# Patient Record
Sex: Female | Born: 1965 | Hispanic: No | Marital: Married | Smoking: Former smoker
Health system: Southern US, Community
[De-identification: ages and names within clinical notes are randomized; demographics above are authoritative.]

## PROBLEM LIST (undated history)

## (undated) DIAGNOSIS — K219 Gastro-esophageal reflux disease without esophagitis: Secondary | ICD-10-CM

## (undated) DIAGNOSIS — K599 Functional intestinal disorder, unspecified: Secondary | ICD-10-CM

## (undated) DIAGNOSIS — F329 Major depressive disorder, single episode, unspecified: Secondary | ICD-10-CM

## (undated) DIAGNOSIS — K3184 Gastroparesis: Secondary | ICD-10-CM

## (undated) DIAGNOSIS — N3010 Interstitial cystitis (chronic) without hematuria: Secondary | ICD-10-CM

## (undated) DIAGNOSIS — I7300 Raynaud's syndrome without gangrene: Secondary | ICD-10-CM

## (undated) DIAGNOSIS — F32A Depression, unspecified: Secondary | ICD-10-CM

## (undated) DIAGNOSIS — R000 Tachycardia, unspecified: Secondary | ICD-10-CM

## (undated) DIAGNOSIS — I951 Orthostatic hypotension: Secondary | ICD-10-CM

## (undated) DIAGNOSIS — F419 Anxiety disorder, unspecified: Secondary | ICD-10-CM

## (undated) DIAGNOSIS — K6289 Other specified diseases of anus and rectum: Secondary | ICD-10-CM

## (undated) DIAGNOSIS — M5030 Other cervical disc degeneration, unspecified cervical region: Secondary | ICD-10-CM

## (undated) DIAGNOSIS — G90A Postural orthostatic tachycardia syndrome (POTS): Secondary | ICD-10-CM

## (undated) DIAGNOSIS — R42 Dizziness and giddiness: Secondary | ICD-10-CM

## (undated) DIAGNOSIS — I498 Other specified cardiac arrhythmias: Secondary | ICD-10-CM

## (undated) DIAGNOSIS — E669 Obesity, unspecified: Secondary | ICD-10-CM

## (undated) DIAGNOSIS — E162 Hypoglycemia, unspecified: Secondary | ICD-10-CM

## (undated) DIAGNOSIS — M797 Fibromyalgia: Secondary | ICD-10-CM

## (undated) DIAGNOSIS — M6289 Other specified disorders of muscle: Secondary | ICD-10-CM

## (undated) HISTORY — PX: TOTAL KNEE ARTHROPLASTY: SHX125

## (undated) HISTORY — DX: Raynaud's syndrome without gangrene: I73.00

## (undated) HISTORY — DX: Hypoglycemia, unspecified: E16.2

## (undated) HISTORY — DX: Other specified disorders of muscle: M62.89

## (undated) HISTORY — DX: Anxiety disorder, unspecified: F41.9

## (undated) HISTORY — PX: CHOLECYSTECTOMY: SHX55

## (undated) HISTORY — DX: Gastro-esophageal reflux disease without esophagitis: K21.9

## (undated) HISTORY — DX: Interstitial cystitis (chronic) without hematuria: N30.10

## (undated) HISTORY — DX: Functional intestinal disorder, unspecified: K59.9

## (undated) HISTORY — DX: Gastroparesis: K31.84

## (undated) HISTORY — DX: Other cervical disc degeneration, unspecified cervical region: M50.30

## (undated) HISTORY — DX: Depression, unspecified: F32.A

## (undated) HISTORY — PX: ILEOSTOMY REVISION: SHX1785

## (undated) HISTORY — DX: Other specified diseases of anus and rectum: K62.89

## (undated) HISTORY — DX: Obesity, unspecified: E66.9

## (undated) HISTORY — DX: Fibromyalgia: M79.7

## (undated) HISTORY — DX: Major depressive disorder, single episode, unspecified: F32.9

---

## 1987-06-28 HISTORY — PX: OTHER SURGICAL HISTORY: SHX169

## 1993-12-20 HISTORY — PX: ESOPHAGOGASTRODUODENOSCOPY: SHX1529

## 1998-01-13 ENCOUNTER — Encounter: Admission: RE | Admit: 1998-01-13 | Discharge: 1998-01-13 | Payer: Self-pay | Admitting: *Deleted

## 1999-05-23 HISTORY — PX: ABDOMINAL HYSTERECTOMY: SHX81

## 1999-10-17 ENCOUNTER — Encounter: Payer: Self-pay | Admitting: Family Medicine

## 1999-10-17 ENCOUNTER — Encounter: Admission: RE | Admit: 1999-10-17 | Discharge: 1999-10-17 | Payer: Self-pay | Admitting: Family Medicine

## 2000-03-28 ENCOUNTER — Encounter: Payer: Self-pay | Admitting: Family Medicine

## 2000-03-28 ENCOUNTER — Encounter: Admission: RE | Admit: 2000-03-28 | Discharge: 2000-03-28 | Payer: Self-pay | Admitting: Family Medicine

## 2001-07-04 ENCOUNTER — Encounter: Payer: Self-pay | Admitting: Internal Medicine

## 2001-07-04 ENCOUNTER — Ambulatory Visit (HOSPITAL_COMMUNITY): Admission: RE | Admit: 2001-07-04 | Discharge: 2001-07-04 | Payer: Self-pay | Admitting: Internal Medicine

## 2001-07-04 ENCOUNTER — Encounter (INDEPENDENT_AMBULATORY_CARE_PROVIDER_SITE_OTHER): Payer: Self-pay | Admitting: Specialist

## 2001-07-04 HISTORY — PX: UPPER GASTROINTESTINAL ENDOSCOPY: SHX188

## 2001-08-08 ENCOUNTER — Encounter: Payer: Self-pay | Admitting: Internal Medicine

## 2001-08-08 HISTORY — PX: COLONOSCOPY: SHX174

## 2002-10-22 ENCOUNTER — Encounter: Payer: Self-pay | Admitting: Emergency Medicine

## 2002-10-22 ENCOUNTER — Emergency Department (HOSPITAL_COMMUNITY): Admission: EM | Admit: 2002-10-22 | Discharge: 2002-10-22 | Payer: Self-pay | Admitting: Emergency Medicine

## 2002-10-28 ENCOUNTER — Encounter (INDEPENDENT_AMBULATORY_CARE_PROVIDER_SITE_OTHER): Payer: Self-pay | Admitting: Specialist

## 2002-10-28 ENCOUNTER — Observation Stay (HOSPITAL_COMMUNITY): Admission: RE | Admit: 2002-10-28 | Discharge: 2002-10-29 | Payer: Self-pay | Admitting: General Surgery

## 2002-10-28 ENCOUNTER — Encounter: Payer: Self-pay | Admitting: General Surgery

## 2003-01-25 ENCOUNTER — Encounter: Admission: RE | Admit: 2003-01-25 | Discharge: 2003-01-25 | Payer: Self-pay | Admitting: Orthopedic Surgery

## 2003-01-25 ENCOUNTER — Encounter: Payer: Self-pay | Admitting: Orthopedic Surgery

## 2004-01-04 ENCOUNTER — Emergency Department (HOSPITAL_COMMUNITY): Admission: EM | Admit: 2004-01-04 | Discharge: 2004-01-04 | Payer: Self-pay | Admitting: Emergency Medicine

## 2004-03-14 ENCOUNTER — Encounter: Admission: RE | Admit: 2004-03-14 | Discharge: 2004-03-14 | Payer: Self-pay | Admitting: Obstetrics & Gynecology

## 2004-03-18 ENCOUNTER — Encounter (HOSPITAL_COMMUNITY): Admission: RE | Admit: 2004-03-18 | Discharge: 2004-06-16 | Payer: Self-pay | Admitting: Diagnostic Radiology

## 2004-12-12 ENCOUNTER — Encounter: Payer: Self-pay | Admitting: Internal Medicine

## 2004-12-14 ENCOUNTER — Encounter: Admission: RE | Admit: 2004-12-14 | Discharge: 2004-12-14 | Payer: Self-pay | Admitting: *Deleted

## 2004-12-14 ENCOUNTER — Encounter: Payer: Self-pay | Admitting: Internal Medicine

## 2005-03-21 ENCOUNTER — Encounter: Admission: RE | Admit: 2005-03-21 | Discharge: 2005-03-21 | Payer: Self-pay | Admitting: Family Medicine

## 2005-07-11 ENCOUNTER — Encounter: Payer: Self-pay | Admitting: Internal Medicine

## 2005-07-17 ENCOUNTER — Encounter: Payer: Self-pay | Admitting: Internal Medicine

## 2005-08-06 ENCOUNTER — Encounter: Admission: RE | Admit: 2005-08-06 | Discharge: 2005-08-06 | Payer: Self-pay | Admitting: *Deleted

## 2005-08-14 ENCOUNTER — Inpatient Hospital Stay (HOSPITAL_COMMUNITY): Admission: EM | Admit: 2005-08-14 | Discharge: 2005-08-19 | Payer: Self-pay | Admitting: Psychiatry

## 2005-08-14 ENCOUNTER — Ambulatory Visit: Payer: Self-pay | Admitting: Psychiatry

## 2005-08-21 ENCOUNTER — Other Ambulatory Visit (HOSPITAL_COMMUNITY): Admission: RE | Admit: 2005-08-21 | Discharge: 2005-08-30 | Payer: Self-pay | Admitting: Psychiatry

## 2005-09-15 ENCOUNTER — Observation Stay (HOSPITAL_COMMUNITY): Admission: EM | Admit: 2005-09-15 | Discharge: 2005-09-16 | Payer: Self-pay | Admitting: Emergency Medicine

## 2005-09-28 ENCOUNTER — Ambulatory Visit (HOSPITAL_COMMUNITY): Admission: RE | Admit: 2005-09-28 | Discharge: 2005-09-28 | Payer: Self-pay | Admitting: Cardiology

## 2006-01-30 ENCOUNTER — Encounter: Payer: Self-pay | Admitting: Internal Medicine

## 2006-03-28 ENCOUNTER — Encounter: Payer: Self-pay | Admitting: Internal Medicine

## 2006-03-30 ENCOUNTER — Encounter: Admission: RE | Admit: 2006-03-30 | Discharge: 2006-03-30 | Payer: Self-pay | Admitting: Obstetrics & Gynecology

## 2006-06-08 ENCOUNTER — Ambulatory Visit (HOSPITAL_COMMUNITY): Admission: RE | Admit: 2006-06-08 | Discharge: 2006-06-08 | Payer: Self-pay | Admitting: Gastroenterology

## 2006-06-08 ENCOUNTER — Encounter: Payer: Self-pay | Admitting: Internal Medicine

## 2006-06-15 ENCOUNTER — Encounter: Payer: Self-pay | Admitting: Internal Medicine

## 2006-06-15 HISTORY — PX: UPPER GASTROINTESTINAL ENDOSCOPY: SHX188

## 2006-06-25 ENCOUNTER — Encounter: Payer: Self-pay | Admitting: Internal Medicine

## 2006-06-28 ENCOUNTER — Ambulatory Visit: Payer: Self-pay | Admitting: Internal Medicine

## 2006-06-29 ENCOUNTER — Ambulatory Visit: Payer: Self-pay | Admitting: Internal Medicine

## 2006-06-29 LAB — CONVERTED CEMR LAB
Basophils Absolute: 0 10*3/uL (ref 0.0–0.1)
Basophils Relative: 0.4 % (ref 0.0–1.0)
Eosinophils Absolute: 0.1 10*3/uL (ref 0.0–0.6)
Eosinophils Relative: 1.8 % (ref 0.0–5.0)
HCT: 41.1 % (ref 36.0–46.0)
Hemoglobin: 14.3 g/dL (ref 12.0–15.0)
Lymphocytes Relative: 29.9 % (ref 12.0–46.0)
MCHC: 34.8 g/dL (ref 30.0–36.0)
MCV: 84.1 fL (ref 78.0–100.0)
Magnesium: 1.9 mg/dL (ref 1.5–2.5)
Monocytes Absolute: 0.4 10*3/uL (ref 0.2–0.7)
Monocytes Relative: 8.5 % (ref 3.0–11.0)
Neutro Abs: 3.1 10*3/uL (ref 1.4–7.7)
Neutrophils Relative %: 59.4 % (ref 43.0–77.0)
Platelets: 306 10*3/uL (ref 150–400)
RBC: 4.88 M/uL (ref 3.87–5.11)
RDW: 12.9 % (ref 11.5–14.6)
TSH: 0.87 microintl units/mL (ref 0.35–5.50)
WBC: 5.1 10*3/uL (ref 4.5–10.5)

## 2006-08-03 ENCOUNTER — Encounter: Admission: RE | Admit: 2006-08-03 | Discharge: 2006-08-03 | Payer: Self-pay | Admitting: Ophthalmology

## 2006-10-12 ENCOUNTER — Inpatient Hospital Stay (HOSPITAL_COMMUNITY): Admission: AD | Admit: 2006-10-12 | Discharge: 2006-10-17 | Payer: Self-pay | Admitting: Psychiatry

## 2006-10-12 ENCOUNTER — Emergency Department (HOSPITAL_COMMUNITY): Admission: EM | Admit: 2006-10-12 | Discharge: 2006-10-12 | Payer: Self-pay | Admitting: Emergency Medicine

## 2006-10-12 ENCOUNTER — Ambulatory Visit: Payer: Self-pay | Admitting: Psychiatry

## 2006-10-30 ENCOUNTER — Encounter: Payer: Self-pay | Admitting: Internal Medicine

## 2007-03-29 ENCOUNTER — Encounter: Admission: RE | Admit: 2007-03-29 | Discharge: 2007-03-29 | Payer: Self-pay | Admitting: Obstetrics & Gynecology

## 2007-08-08 ENCOUNTER — Encounter: Payer: Self-pay | Admitting: Internal Medicine

## 2007-08-13 ENCOUNTER — Encounter: Payer: Self-pay | Admitting: Internal Medicine

## 2007-09-15 ENCOUNTER — Encounter: Admission: RE | Admit: 2007-09-15 | Discharge: 2007-09-15 | Payer: Self-pay | Admitting: Rheumatology

## 2008-03-31 ENCOUNTER — Encounter: Admission: RE | Admit: 2008-03-31 | Discharge: 2008-03-31 | Payer: Self-pay | Admitting: Obstetrics & Gynecology

## 2009-04-05 ENCOUNTER — Encounter: Admission: RE | Admit: 2009-04-05 | Discharge: 2009-04-05 | Payer: Self-pay | Admitting: Obstetrics & Gynecology

## 2009-05-11 ENCOUNTER — Encounter: Payer: Self-pay | Admitting: Internal Medicine

## 2009-05-24 ENCOUNTER — Encounter: Payer: Self-pay | Admitting: Internal Medicine

## 2009-06-22 ENCOUNTER — Encounter: Payer: Self-pay | Admitting: Internal Medicine

## 2009-09-14 ENCOUNTER — Encounter: Payer: Self-pay | Admitting: Internal Medicine

## 2009-10-04 ENCOUNTER — Encounter: Payer: Self-pay | Admitting: Internal Medicine

## 2009-10-16 ENCOUNTER — Emergency Department (HOSPITAL_COMMUNITY): Admission: EM | Admit: 2009-10-16 | Discharge: 2009-10-16 | Payer: Self-pay | Admitting: Emergency Medicine

## 2009-10-16 ENCOUNTER — Encounter: Payer: Self-pay | Admitting: Internal Medicine

## 2009-10-28 ENCOUNTER — Ambulatory Visit: Payer: Self-pay | Admitting: Internal Medicine

## 2009-10-28 DIAGNOSIS — K219 Gastro-esophageal reflux disease without esophagitis: Secondary | ICD-10-CM | POA: Insufficient documentation

## 2009-10-28 DIAGNOSIS — K599 Functional intestinal disorder, unspecified: Secondary | ICD-10-CM

## 2009-11-08 ENCOUNTER — Telehealth: Payer: Self-pay | Admitting: Internal Medicine

## 2009-11-24 ENCOUNTER — Telehealth: Payer: Self-pay | Admitting: Internal Medicine

## 2009-12-06 ENCOUNTER — Telehealth: Payer: Self-pay | Admitting: Internal Medicine

## 2009-12-31 ENCOUNTER — Telehealth: Payer: Self-pay | Admitting: Internal Medicine

## 2010-02-23 ENCOUNTER — Telehealth: Payer: Self-pay | Admitting: Internal Medicine

## 2010-02-25 ENCOUNTER — Encounter: Payer: Self-pay | Admitting: Internal Medicine

## 2010-02-25 ENCOUNTER — Telehealth: Payer: Self-pay | Admitting: Internal Medicine

## 2010-02-27 ENCOUNTER — Encounter: Payer: Self-pay | Admitting: Internal Medicine

## 2010-03-02 ENCOUNTER — Telehealth: Payer: Self-pay | Admitting: Internal Medicine

## 2010-03-04 ENCOUNTER — Telehealth: Payer: Self-pay | Admitting: Internal Medicine

## 2010-03-16 ENCOUNTER — Telehealth: Payer: Self-pay | Admitting: Internal Medicine

## 2010-04-19 ENCOUNTER — Encounter: Admission: RE | Admit: 2010-04-19 | Discharge: 2010-04-19 | Payer: Self-pay | Admitting: Obstetrics & Gynecology

## 2010-05-11 ENCOUNTER — Encounter: Payer: Self-pay | Admitting: Internal Medicine

## 2010-06-21 NOTE — Letter (Signed)
Summary: [facility]  [facility]   Imported By: Lester  11/09/2009 10:56:21  _____________________________________________________________________  External Attachment:    Type:   Image     Comment:   External Document

## 2010-06-21 NOTE — Assessment & Plan Note (Signed)
Summary: abdominal pain/sheri   History of Present Illness Visit Type: new patient  Primary GI MD: Stan Head MD [facility] Primary Provider: Gilmore Laroche, MD  Requesting Provider: na Chief Complaint: Upper abd pain  History of Present Illness:   45 yo ww with epigastric and chest pains diagnosed as GERD and treated as such but having persistent symptoms.In November 2010, costochondritis was diagnosed.She experienced increasing pain in upper abdomen despite therapy. She underwent an EGD and colonoscopy by  Dorena Cookey, MD. She has been on multiple PPI's, Dexilant and Carafate have not helped. She has experienced sore throat and hoarseness lately also. She was evaluated by Dr. Suzanna Obey - Zantac added as  "throat was  inflamed from reflux". Dr. Jearld Fenton indicated she may need a pH probe and could need a fundoplication. She has read about fundoplication and since she also has delayed gastric emptying and a diagnosis of gastroparesis, is concerned about problems fronm that type of surgery. Further symptom descrioptions:  Awakens at night with epigastric pain, feels like contractions fro labor but is constant. Nothing helps. It will ease off and be less severe. Stools green when it happens. Pain everyday, some better than others  No triggers identified. + nausesa, no vomiting, ? early satiety She has eliminated coffee, carbonated drinks, bed elevated, no greasy or spicy foods. Stopped dairy.  Special needs daughter hydrocephalus, developmental delay, chronic diarrhea - reflux, at [facility] Oldest daughter also has GERD, fibromyalgia, IBS   She has seen gastroenterologists since the 1980's at least, having also seen Drs. Buccini and Brodie, with prior endoscopic evaluations for similar problems.   GI Review of Systems    Reports abdominal pain, acid reflux, belching, bloating, chest pain, heartburn, loss of appetite, and  nausea.     Location of  Abdominal pain: upper abdomen.    Denies dysphagia with  liquids, dysphagia with solids, vomiting, vomiting blood, weight loss, and  weight gain.      Reports irritable bowel syndrome.     Denies anal fissure, black tarry stools, change in bowel habit, constipation, diarrhea, fecal incontinence, heme positive stool, hemorrhoids, jaundice, light color stool, liver problems, rectal bleeding, and  rectal pain.     Current Medications (verified): 1)  Dexilant 60 Mg Cpdr (Dexlansoprazole) .... Take 1 Tablet By Mouth Once A Day 2)  Carafate 1 Gm Tabs (Sucralfate) .... Take 1 Tablet By Mouth Four Times A Day 3)  Zantac 300 Mg Tabs (Ranitidine Hcl) .... Take 1 Tab By Mouth At Bedtime 4)  Zofran 4 Mg Tabs (Ondansetron Hcl) .... As Needed  Allergies (verified): 1)  Celebrex 2)  Lunesta (Eszopiclone)  Past History:  Past Medical History: Bipolar Cervical DDD Anxiety Disorder Depression Fibromyalgia GERD Irritable Bowel Syndrome Raynaud's phenomenon Gastroparesis ? Dysautonomia - [facility] says no  Past Surgical History: Reviewed history from 10/28/2009 and no changes required. Cholecystectomy Hysterectomy Knee Arthroscopy R-1989, L-12/2008  Family History: Family History of Colon Polyps: Paternal Uncle No FH of Colon Cancer: Family History of Esophageal Cancer: Paternal Uncle  Family History of Diabetes: Father  Family History of Heart Disease: Father   Social History: Married, 2 girls Homemaker Alcohol Use - no Daily Caffeine Use -2+ day Illicit Drug Use - no Patient gets regular exercise. Patient is a former smoker.  Smoking Status:  quit  Review of Systems       The patient complains of cough, muscle pains/cramps, sore throat, and voice change.         All other  ROS negative except as per HPI.   Vital Signs:  Patient profile:   44 year old female Height:      67 inches Weight:      238 pounds BMI:     37.41 BSA:     2.18 Pulse rate:   88 / minute Pulse rhythm:   regular BP sitting:   126 / 82  (left arm) Cuff size:    regular  Vitals Entered By: Ok Anis CMA (October 28, 2009 1:39 PM)  Physical Exam  General:  obese.  NAD Eyes:   no icterus. Mouth:  No deformity or lesions, dentition normal. Neck:  Supple; no masses or thyromegaly. Chest Wall:  nontender Lungs:  Clear throughout to auscultation. Heart:  Regular rate and rhythm; no murmurs, rubs,  or bruits. Abdomen:  soft, diffusely tender BS+ no masses, HSM Neurologic:  Alert and  oriented x4;  Skin:  sebaceous cyst mid back Cervical Nodes:  No significant cervical or supraclavicular adenopathy.  Psych:  Alert and cooperative. Normal mood and affect.   Impression & Recommendations:  Problem # 1:  ABDOMINAL PAIN, UPPER (ICD-789.09) Assessment New FUNCTIONAL ABDOMINAL PAIN IS MOST LIKELY GIVEN CHRONICITY, LACK OF CORELLATIVE FINDINGS ON ENDOSCOPY AS WELL AS BACKGROUND OF IBS AND FIBROMYALGIA. SHE HAS DELAYED GASTRIC EMPTYING ALSO. SJHE NEEDS A CHANGE IN THERAPEUTIC APPROACH AND WILL TRY NORTRIPTYLLINE AND DICYCLOMINE. THE RATIONALE AND POSSIBLE SIDE EFFECTS HAVE BEEN DISCUSSED.  SHE HAS HAD SIMILAR PROBLEMS GOING BACK TO THE 1980'S.   Will review Dr. Jearld Fenton' and Deveshwar's records.  Problem # 2:  GASTROPARESIS (ICD-536.3) Assessment: New She may do better on low-dose anticholinergics. She has not responded to metaclopramide (per old records) and SHE SHOULD NOT HAVE A FUNDOPLICATION.  Problem # 3:  GERD (ICD-530.81) Assessment: New She most likely has some of this but clearly not causing most/all of herproblems as she has not significantly improved with aggressive therapy. ? if ENT findings in larynzx and pharynx are GERD or something else. she could need a pH probe but would not do yet. Review ENT records.  Problem # 4:  IRRITABLE BOWEL SYNDROME (ICD-564.1) Assessment: Comment Only  Problem # 5:  FIBROMYALGIA (ICD-729.1) Assessment: Comment Only  Complicated patient withextensive past history and records review.  Patient  Instructions: 1)  Please pick up your medications at your pharmacy.  2)  Stop taking carafate (sulcralfate). 3)  Begin taking Dicyclomine and Nortriptyline as directed below. 4)  Please schedule a follow-up appointment in 6 weeks.  5)  Copy sent to : Gilmore Laroche, MD, Suzanna Obey, MD, Pollyann Savoy, MD 6)  The medication list was reviewed and reconciled.  All changed / newly prescribed medications were explained.  A complete medication list was provided to the patient / caregiver. Prescriptions: DICYCLOMINE HCL 20 MG TABS (DICYCLOMINE HCL) 1 by mouth q 6 hrs as needed abdominal pain  #60 x 2   Entered and Authorized by:   Iva Boop MD, [facility]   Signed by:   Iva Boop MD, [facility] on 10/28/2009   Method used:   Electronically to        [facility] [address] [phone]* (retail)       [address]       [place], [place]  [address]       Ph: [phone] or [phone]       Fax: [phone]   RxID:   [id number] NORTRIPTYLINE HCL  25 MG CAPS (NORTRIPTYLINE HCL) 1 by mouth at bedtime  #30 x 2   Entered and Authorized by:   Iva Boop MD, [facility]   Signed by:   Iva Boop MD, [facility] on 10/28/2009   Method used:   Electronically to        [facility] [address] [phone]* (retail)       [address]       [place], [place]  [address]       Ph: [phone] or [phone]       Fax: [phone]   RxID:   [id number]

## 2010-06-21 NOTE — Letter (Signed)
Summary: [facility]   Imported By: Sherian Rein 11/03/2009 13:23:56  _____________________________________________________________________  External Attachment:    Type:   Image     Comment:   External Document

## 2010-06-21 NOTE — Letter (Signed)
Summary: [facility]   Imported By: Sherian Rein 11/03/2009 13:26:37  _____________________________________________________________________  External Attachment:    Type:   Image     Comment:   External Document

## 2010-06-21 NOTE — Letter (Signed)
Summary: [facility]  [facility]   Imported By: Sherian Rein 11/03/2009 13:17:27  _____________________________________________________________________  External Attachment:    Type:   Image     Comment:   External Document

## 2010-06-21 NOTE — Letter (Signed)
Summary: [facility]   Imported By: Sherian Rein 11/03/2009 13:22:50  _____________________________________________________________________  External Attachment:    Type:   Image     Comment:   External Document

## 2010-06-21 NOTE — Progress Notes (Signed)
Summary: Triage  Phone Note Call from Patient Call back at Home Phone [phone]   Caller: Patient Call For: Dr. Leone Payor Reason for Call: Talk to Nurse Summary of Call: pt has been taking Zofran and it is not working...wants to know if something can be prescribed for nausea Initial call taken by: Karna Christmas,  December 31, 2009 12:11 PM  Follow-up for Phone Call        Last OV 10-28-09. She has an appt. to see Dr.Koch on 02-23-10. Pt. states Zofran 4mg  doesn't help the nausea, she hasn't taken it for several weeks because it doesn't help. She wants to take a liquid form of nausea medicine.  She also states the Dicyclomine doesn't help, so she has stopped that.  DR.GESSNER PLEASE ADVISE  Follow-up by: Laureen Ochs LPN,  December 31, 2009 1:06 PM  Additional Follow-up for Phone Call Additional follow up Details #1::        liquid promethzine rxed Additional Follow-up by: Iva Boop MD, [facility],  December 31, 2009 3:26 PM    Additional Follow-up for Phone Call Additional follow up Details #2::    Pt. aware script sent to her pharmacy. Pt. instructed to call back as needed.  Follow-up by: Laureen Ochs LPN,  December 31, 2009 3:32 PM  New/Updated Medications: PROMETHAZINE HCL 6.25 MG/5ML SOLN (PROMETHAZINE HCL) 2-4 teaspoons as needed every 6-8 hours for nausea Prescriptions: PROMETHAZINE HCL 6.25 MG/5ML SOLN (PROMETHAZINE HCL) 2-4 teaspoons as needed every 6-8 hours for nausea  #500cc x 1   Entered and Authorized by:   Iva Boop MD, [facility]   Signed by:   Iva Boop MD, FACG on 12/31/2009   Method used:   Electronically to        [facility] [address] [phone]* (retail)       [address]       [place], [place]  [address]       Ph: [phone] or [phone]       Fax: [phone]   RxID:   [id number]

## 2010-06-21 NOTE — Progress Notes (Signed)
Summary: Referral Appt.  Phone Note Other Incoming   Caller: Misty Stanley @ Case Manager for [facility] [phone] Summary of Call: requesting a referral to pain clinic for injections for abd pain since her gallbladder being removed...requesting to speak directly w/nurse Initial call taken by: Karna Christmas,  March 04, 2010 2:18 PM  Follow-up for Phone Call        message left on voicemail for case manager. Francee Piccolo CMA [facility])  March 08, 2010 10:12 AM   RC from [place]...advised her that we are referring pt to pain specialist.  No other follow up needed by their office. Follow-up by: Francee Piccolo CMA [facility]),  March 08, 2010 11:15 AM

## 2010-06-21 NOTE — Letter (Signed)
[facility]: [facility]  [facility]   Imported By: Lester [place] 11/09/2009 10:58:23  _____________________________________________________________________  External Attachment:    Type:   Image     Comment:   External Document

## 2010-06-21 NOTE — Letter (Signed)
Summary: [facility]  [facility]   Imported By: Lester [place] 11/09/2009 10:52:36  _____________________________________________________________________  External Attachment:    Type:   Image     Comment:   External Document

## 2010-06-21 NOTE — Progress Notes (Signed)
Summary: reflux  Phone Note Call from Patient Call back at [facility] Phone [phone]   Caller: Patient Call For: Dr. Leone Payor Reason for Call: Talk to Nurse Summary of Call: experiencing "really bad reflux"... sometimes cannot talk... would like to know if she needs to take additional meds Initial call taken by: Vallarie Mare,  December 06, 2009 3:31 PM  Follow-up for Phone Call        patient wants to increase her meds to two times a day, I have advised her again that she shouldn't increase her meds and I again reviewed antireflux diet and measures with her.  She reports that she was very hoarse this weekend and feels it was from reflux.  She will continue on her meds as ordered.  Call back for further problems  Follow-up by: Darcey Nora RN, CGRN,  December 07, 2009 9:37 AM

## 2010-06-21 NOTE — Letter (Signed)
Summary: [facility]  [facility]   Imported By: Sherian Rein 11/03/2009 13:14:55  _____________________________________________________________________  External Attachment:    Type:   Image     Comment:   External Document

## 2010-06-21 NOTE — Progress Notes (Signed)
Summary: Med questions  Phone Note Call from Patient Call back at Home Phone [phone]   Caller: Patient Summary of Call: pt has an Rx of Zofran from Dr. Madilyn Fireman she would like to know about increasing it, she also has some questions about her Nortriptyline also.  Initial call taken by: Harlow Mares CMA [facility]ull),  November 24, 2009 10:36 AM  Follow-up for Phone Call        Patient  wants to increase her zofran to 4 mg, patient states dicyclomine 20 mg q 6 and nortriptyline are not helping with the pain.  I have hadvised her not to increase zofran , dicyclomine or nortirptyline until Dr Leone Payor reviews.  Please advise Follow-up by: Darcey Nora RN, CGRN,  November 24, 2009 10:52 AM  Additional Follow-up for Phone Call Additional follow up Details #1::        I do not have other ideas and do not recommend the changes requested. It is possible that not enough time has passed to say nortriptylline not working. I would like to refer her to [facility] or [facility] as they usually have more experience with these types of problems and may have other ideas.  Additional Follow-up by: Iva Boop MD, [facility],  November 24, 2009 12:39 PM    Additional Follow-up for Phone Call Additional follow up Details #2::    patient would like referral to [facility].  who do you recommend? Follow-up by: Darcey Nora RN, CGRN,  November 24, 2009 1:35 PM  Additional Follow-up for Phone Call Additional follow up Details #3:: Details for Additional Follow-up Action Taken: Dr. Alycia Rossetti He is a gastroparesis expert Please get appt and I will send letter also  Referral made to [facility] Dr Alycia Rossetti 02/23/10 8:00 Patient  aware of the appointment details and they will be adding her to his cancelation list. Darcey Nora RN, [facility]  November 24, 2009 2:39 PM

## 2010-06-21 NOTE — Letter (Signed)
Summary: [facility]  [facility]   Imported By: Lester [place] 11/09/2009 10:59:49  _____________________________________________________________________  External Attachment:    Type:   Image     Comment:   External Document

## 2010-06-21 NOTE — Progress Notes (Signed)
Summary: Wants to bring copy of note  Phone Note Call from Patient Call back at Home Phone [phone]   Call For: Dr Leone Payor Reason for Call: Talk to Nurse Summary of Call: Wants to bring a copy of Dr Patsey Berthold note so that we can understand better what she needs. Initial call taken by: Leanor Kail [facility],  February 25, 2010 9:37 AM  Follow-up for Phone Call        patient will bring by and drop off some documentation from Dr Alycia Rossetti.   Follow-up by: Darcey Nora RN, CGRN,  February 25, 2010 9:42 AM  Additional Follow-up for Phone Call Additional follow up Details #1::       Additional Follow-up by: Iva Boop MD, [facility],  February 25, 2010 9:46 AM

## 2010-06-21 NOTE — Progress Notes (Signed)
Summary: pain referral  Phone Note Outgoing Call   Summary of Call: I have seen the info she got from Dr. Alycia Rossetti Please refer to Dr. Wynn Banker or Riley Kill ((Physical medicine-Rehab) re: evaluate and treat suspected abdominal wall pain Iva Boop MD, [facility]  March 02, 2010 4:35 PM   Follow-up for Phone Call        LM to [facility] at home number Francee Piccolo CMA Duncan Dull)  March 03, 2010 2:37 PM   RC from pt.  She is agreeable with above plan.  I will fax info to Dr. Wynn Banker. Follow-up by: Francee Piccolo CMA [facility]ull),  March 04, 2010 10:41 AM     Appended Document: pain referral Dr. Bryson Dames does not eval/treat abdominal pain.  Any other preferences?  Appended Document: pain referral ask Dr Jarold Motto if he remembers name of guy in [place] that gave [place] a talk - Edward Qualia knows it  Appended Document: pain referral I spoke to pt today regarding her pain clinic referral and advised her that we were unable to schedule her with Dr.Kirstien's and we would be making areferral to a pain specialist in [place].  Pt states she was offered by Dr. Alycia Rossetti to see someone at [facility]. She would prefer to see someone there if she is haivng to drive to [facility] [place].  Pt will let me know if she needs anything from [place] for referral at [facility].

## 2010-06-21 NOTE — Letter (Signed)
Summary: [facility]  [facility]   Imported By: Sherian Rein 11/03/2009 13:19:42  _____________________________________________________________________  External Attachment:    Type:   Image     Comment:   External Document

## 2010-06-21 NOTE — Procedures (Signed)
Summary: [facility]  [facility]   Imported By: Sherian Rein 11/03/2009 13:21:13  _____________________________________________________________________  External Attachment:    Type:   Image     Comment:   External Document

## 2010-06-21 NOTE — Procedures (Signed)
Summary: EGD: Gastritis, Bile Reflux   EGD  Procedure date:  07/04/2001  Findings:      Findings: Gastritis, Bile Reflux Location: [facility]                           [facility]  Patient:    REISE, HIETALA Visit Number: [id number] MRN: [id number]          Service Type: END Location: ENDO Attending Physician:  Mervin Hack Dictated by:   Hedwig Morton. Juanda Chance, M.D. [facility] Proc. Date: 07/04/01 Admit Date:  07/04/2001   CC:         Dr. ___________   Operative Report  PROCEDURE:  Upper endoscopy.  INDICATIONS FOR PROCEDURE:  This 45 year old white female has had nausea and vomiting at night. She has had choking on food at night, chest pain as well as epigastric pain. She had an upper endoscopy in 1995 showing gastritis. She also has a positive family history of esophageal cancer and some Barretts esophagus in her uncle and history of colon polyps. She has had significant improvement on Nexium and Reglan 5 mg at bedtime but she just had another episode of vomiting last night. She is undergoing upper endoscopy to further evaluate her reflux and to rule out gastric outlet obstruction.  ENDOSCOPE:  Olympus single channel videoscope.  SEDATION:  Versed 10 mg IV, Demerol 50 mg IV.  FINDINGS:  Olympus single channel videoscope passed under direct vision through the posterior pharynx into the esophagus. The patient was monitored by pulse oximeter, her oxygen saturations were normal. She was cooperative. Proximal and distal esophageal mucosa was normal with normal appearing squamocolumnar junction. There was no irregularity of the Z line, no hiatal hernia or stricture.  STOMACH:  The stomach was insufflated with air and showed a small amount of bowel along the greater curvature of the stomach. The mucosa was generally erythematous but there were no mucosal disruptions. Biopsies were taken for CLOtest as well as for regular H&E stains. The pyloric outlet  was normal. Retroflexion of the endoscope revealed a normal fundus and cardia.  DUODENUM:  The duodenal bulb and descending duodenum was normal.  IMPRESSION:  1. Nonspecific antral gastritis status post biopsies and CLOtest.  2. Bile reflux.  PLAN:  1. Continue Nexium 40 mg at bedtime, increase to b.i.d. temporarily.  2. Small meals at bedtime to prevent vomiting.  3. Increase Reglan from 5-10 mg at bedtime.  4. Antireflux measures.  5. The patient will schedule a colonoscopy because of rectal bleeding and     irregular bowel habits. Dictated by:   Hedwig Morton. Juanda Chance, M.D. [facility] Attending Physician:  Mervin Hack DD:  07/04/01 TD:  07/04/01 Job: [id number]

## 2010-06-21 NOTE — Progress Notes (Signed)
Summary: Can she take 2 stomach meds?  Phone Note Call from Patient Call back at Home Phone [phone]   Call For: Dr Leone Payor Reason for Call: Talk to Nurse Summary of Call: Was gioven medicine for stomach pain and wonders if she can take two instead of just one. Initial call taken by: Leanor Kail [facility],  November 08, 2009 9:39 AM  Follow-up for Phone Call        pt states she is having abdominal pain.  She is not taking every 6 hours as prescribed.  I advised pt she can not take 2 tablets at a time, but she can take up to 4 tablets per day.  She will try taking every 6 hours and see how that works.  She states nortriptyline is helping her sleep.  If taking every 6 hours is not helping in the next couple of days she will call our office. Follow-up by: Francee Piccolo CMA [facility]),  November 08, 2009 2:29 PM

## 2010-06-21 NOTE — Procedures (Signed)
Summary: Colonoscopy: Normal   Colonoscopy  Procedure date:  08/08/2001  Findings:      Results: Normal. Location:  [facility].    Procedures Next Due Date:    Colonoscopy: 08/2011  Patient Name: Judy Gibbs, Trillo MRN: [id number] Procedure Procedures: Colonoscopy CPT: 23536.  Personnel: Endoscopist: Tamarah Bhullar L. Juanda Chance, MD.  Exam Location: Exam performed in Outpatient Clinic. Outpatient  Patient Consent: Procedure, Alternatives, Risks and Benefits discussed, consent obtained, from patient. Consent was obtained by the RN.  Indications  Surveillance of: Adenomatous Polyp(s). Initial polypectomy was performed in 1987. 1-2 Polyps were found at Index Exam. Prior polyp located in distal colon. The patient has not had surgery.  History  Pre-Exam Physical: Performed Aug 08, 2001. Cardio-pulmonary exam, Rectal exam, HEENT exam , Abdominal exam, Extremity exam, Neurological exam, Mental status exam WNL.  Exam Exam: Extent of exam reached: Cecum, extent intended: Cecum.  The cecum was identified by appendiceal orifice and IC valve. Colon retroflexion performed. Images taken. ASA Classification: I. Tolerance: good.  Monitoring: Pulse and BP monitoring, Oximetry used. Supplemental O2 given.  Colon Prep Used Golytely for colon prep. Prep results: good.  Sedation Meds: Patient assessed and found to be appropriate for moderate (conscious) sedation. Fentanyl 100 mcg. given IV. Versed 5 mg. given IV.  Findings NORMAL EXAM: Cecum. Not Seen: Polyps.   Assessment Normal examination.  Comments: no polyps Events  Unplanned Interventions: No intervention was required.  Unplanned Events: There were no complications. Plans Medication Plan: Continue current medications.  Patient Education: Patient given standard instructions for: a normal exam. Yearly hemoccult testing recommended. Patient instructed to get routine colonoscopy every 10 years.  Disposition: After  procedure patient sent to recovery. After recovery patient sent home.   This report was created from the original endoscopy report, which was reviewed and signed by the above listed endoscopist.

## 2010-06-21 NOTE — Letter (Signed)
Summary: [facility]  [facility]   Imported By: Sherian Rein 11/03/2009 13:18:28  _____________________________________________________________________  External Attachment:    Type:   Image     Comment:   External Document

## 2010-06-21 NOTE — Letter (Signed)
Summary: [facility]  [facility]   Imported By: Lester Sheboygan 11/09/2009 10:57:29  _____________________________________________________________________  External Attachment:    Type:   Image     Comment:   External Document

## 2010-06-21 NOTE — Progress Notes (Signed)
Summary: referral questions  Phone Note Call from Patient Call back at Home Phone [phone]   Caller: Patient Call For: Dr. Leone Payor Reason for Call: Talk to Nurse Summary of Call: questions regarding pain specialist in [facility] Initial call taken by: Vallarie Mare,  March 16, 2010 3:26 PM  Follow-up for Phone Call        pt states she recieved phone call from [facility] with appt date/time.  Advised pt that I had cancelled that referral.  Pt states that she was under the impression her appt was going to be in [place] with Dr. Roderic Ovens, but was not given an address as they are going to mail her a packet of info.  Advised pt I would call their office to get the address of their [facility]. Follow-up by: Francee Piccolo CMA Duncan Dull),  March 17, 2010 4:53 PM  Additional Follow-up for Phone Call Additional follow up Details #1::        Per Revonda Standard, no clinic site in [place] and pt is scheduled at [facility] location.  Per pt she will cancel appt at this clinic and reschedule appt with Dr. Alycia Rossetti. Additional Follow-up by: Francee Piccolo CMA (AAMA),  March 18, 2010 10:00 AM

## 2010-06-21 NOTE — Letter (Signed)
Summary: [facility]  [facility]   Imported By: Sherian Rein 11/03/2009 13:16:26  _____________________________________________________________________  External Attachment:    Type:   Image     Comment:   External Document

## 2010-06-21 NOTE — Progress Notes (Signed)
Summary: Looking for a Pain Dr in [facility]  Phone Note Call from Patient Call back at [facility] Phone [phone]   Call For: Dr Leone Payor Reason for Call: Talk to Nurse Summary of Call: Was referred to Dr Adriana Simas at [facility] nad he asked her to ask Dr Leone Payor if he knew a Pain Doctor in [place] that she can see. Initial call taken by: Leanor Kail [facility],  February 23, 2010 12:28 PM  Follow-up for Phone Call        Dr Alycia Rossetti saw pt today at [facility] he suggested pt be referred to a apin clinic in [place] .  He feels her pain may be scar tissue from her cholecystectomy and that she may need an injection in the site done by a pain clinic.  You should be getting the dictatation in a few days.  Please advise Follow-up by: Jesse Fall RN,  February 23, 2010 2:20 PM  Additional Follow-up for Phone Call Additional follow up Details #1::        let her know i am waiting for the dictation then will follow-up with her on this - I need to see exactly what Dr. Alycia Rossetti is saying Additional Follow-up by: Iva Boop MD, Clementeen Graham,  February 24, 2010 1:24 PM     Appended Document: Looking for a Pain Dr in [facility] Patient advised of Dr Marvell Fuller response.

## 2010-06-21 NOTE — Letter (Signed)
Summary: Dr Alycia Rossetti [facility]  Dr Alycia Rossetti [facility]   Imported By: Sherian Rein 03/09/2010 13:22:21  _____________________________________________________________________  External Attachment:    Type:   Image     Comment:   External Document

## 2010-06-21 NOTE — Consult Note (Signed)
Summary: [facility]  [facility]   Imported By: Lennie Odor 03/31/2010 16:06:50  _____________________________________________________________________  External Attachment:    Type:   Image     Comment:   External Document

## 2010-07-16 ENCOUNTER — Emergency Department (HOSPITAL_COMMUNITY): Payer: 59

## 2010-07-16 ENCOUNTER — Emergency Department (HOSPITAL_COMMUNITY)
Admission: EM | Admit: 2010-07-16 | Discharge: 2010-07-16 | Disposition: A | Discharge status: Home / Self Care | Payer: 59 | Attending: Emergency Medicine | Admitting: Emergency Medicine

## 2010-07-16 DIAGNOSIS — R634 Abnormal weight loss: Secondary | ICD-10-CM | POA: Insufficient documentation

## 2010-07-16 DIAGNOSIS — K3189 Other diseases of stomach and duodenum: Secondary | ICD-10-CM | POA: Insufficient documentation

## 2010-07-16 DIAGNOSIS — K219 Gastro-esophageal reflux disease without esophagitis: Secondary | ICD-10-CM | POA: Insufficient documentation

## 2010-07-16 DIAGNOSIS — R197 Diarrhea, unspecified: Secondary | ICD-10-CM | POA: Insufficient documentation

## 2010-07-16 DIAGNOSIS — R110 Nausea: Secondary | ICD-10-CM | POA: Insufficient documentation

## 2010-07-16 DIAGNOSIS — R1013 Epigastric pain: Secondary | ICD-10-CM | POA: Insufficient documentation

## 2010-07-16 DIAGNOSIS — R630 Anorexia: Secondary | ICD-10-CM | POA: Insufficient documentation

## 2010-07-16 DIAGNOSIS — N949 Unspecified condition associated with female genital organs and menstrual cycle: Secondary | ICD-10-CM | POA: Insufficient documentation

## 2010-07-16 DIAGNOSIS — F411 Generalized anxiety disorder: Secondary | ICD-10-CM | POA: Insufficient documentation

## 2010-07-16 DIAGNOSIS — F3130 Bipolar disorder, current episode depressed, mild or moderate severity, unspecified: Secondary | ICD-10-CM | POA: Insufficient documentation

## 2010-07-16 DIAGNOSIS — Z79899 Other long term (current) drug therapy: Secondary | ICD-10-CM | POA: Insufficient documentation

## 2010-07-16 DIAGNOSIS — R109 Unspecified abdominal pain: Secondary | ICD-10-CM | POA: Insufficient documentation

## 2010-07-16 LAB — URINALYSIS, ROUTINE W REFLEX MICROSCOPIC
Bilirubin Urine: NEGATIVE
Hgb urine dipstick: NEGATIVE
Ketones, ur: NEGATIVE mg/dL
Specific Gravity, Urine: 1.009 (ref 1.005–1.030)
Urobilinogen, UA: 0.2 mg/dL (ref 0.0–1.0)
pH: 8.5 — ABNORMAL HIGH (ref 5.0–8.0)

## 2010-07-16 LAB — BASIC METABOLIC PANEL
Calcium: 9.1 mg/dL (ref 8.4–10.5)
GFR calc Af Amer: >60 mL/min (ref >60)
GFR calc non Af Amer: >60 mL/min (ref >60)
Potassium: 3.5 mEq/L (ref 3.5–5.1)
Sodium: 140 mEq/L (ref 135–145)

## 2010-07-16 LAB — DIFFERENTIAL
Basophils Absolute: 0.1 10*3/uL (ref 0.0–0.1)
Basophils Relative: 1 % (ref 0–1)
Eosinophils Absolute: 0.1 10*3/uL (ref 0.0–0.7)
Eosinophils Relative: 2 % (ref 0–5)
Lymphocytes Relative: 37 % (ref 12–46)
Lymphs Abs: 2 10*3/uL (ref 0.7–4.0)
Monocytes Absolute: 0.4 10*3/uL (ref 0.1–1.0)
Monocytes Relative: 8 % (ref 3–12)
Neutro Abs: 2.8 10*3/uL (ref 1.7–7.7)
Neutrophils Relative %: 52 % (ref 43–77)

## 2010-07-16 LAB — CBC
HCT: 38.5 % (ref 36.0–46.0)
MCH: 29.7 pg (ref 26.0–34.0)
MCV: 81.2 fL (ref 78.0–100.0)
RDW: 13.2 % (ref 11.5–15.5)
WBC: 5.4 10*3/uL (ref 4.0–10.5)

## 2010-07-16 LAB — WET PREP, GENITAL
Clue Cells Wet Prep HPF POC: NONE SEEN
Trich, Wet Prep: NONE SEEN

## 2010-07-16 MED ORDER — IOHEXOL 300 MG/ML  SOLN
120.0000 mL | Freq: Once | INTRAMUSCULAR | Status: AC | PRN
  Administered 2010-07-16: 120 mL via INTRAVENOUS

## 2010-07-18 LAB — GC/CHLAMYDIA PROBE AMP, GENITAL: Chlamydia, DNA Probe: NEGATIVE

## 2010-07-20 ENCOUNTER — Telehealth: Payer: Self-pay | Admitting: Gastroenterology

## 2010-07-22 ENCOUNTER — Other Ambulatory Visit: Payer: Self-pay | Admitting: Surgical Oncology

## 2010-07-22 DIAGNOSIS — N823 Fistula of vagina to large intestine: Secondary | ICD-10-CM

## 2010-07-28 NOTE — Progress Notes (Signed)
  Phone Note Call from Patient   Caller: Patient Summary of Call: On call note at 2010. Pt had severe rectal pain that subsided after 20-30 min. Having loose stools on Miralax which is typical. No fever, rectal bleeding, abd pain, N/V. Had diarrhea per vagina several days ago and has seen her GYN, had an abd/pelvic CT and has a colonoscopy set up with Dr. Jess Barters at [facility]. Report from 2/25 CT reviewed. Advised to go to ED tonight if severe rectal pain recurs. Contact Dr. Alycia Rossetti or Dr. Leone Payor in the morning for further recommendations. Initial call taken by: Meryl Dare MD Clementeen Graham,  July 20, 2010 8:15 PM     Appended Document:  we have not been managing this issue and since Dr. Alycia Rossetti is planning colonoscopy she should be contacting him  Appended Document:  I spoke with the patient she reports she has an appointment with Dr Alycia Rossetti tomorrow.

## 2010-08-05 ENCOUNTER — Other Ambulatory Visit: Payer: 59

## 2010-08-05 ENCOUNTER — Ambulatory Visit
Admission: RE | Admit: 2010-08-05 | Discharge: 2010-08-05 | Disposition: A | Discharge status: Home / Self Care | Payer: 59 | Source: Ambulatory Visit | Attending: Surgical Oncology | Admitting: Surgical Oncology

## 2010-08-05 DIAGNOSIS — N823 Fistula of vagina to large intestine: Secondary | ICD-10-CM

## 2010-08-08 LAB — URINE CULTURE: Colony Count: 10000

## 2010-08-08 LAB — POCT CARDIAC MARKERS: Myoglobin, poc: 41.1 ng/mL (ref 12–200)

## 2010-08-08 LAB — CBC
Platelets: 243 10*3/uL (ref 150–400)
WBC: 5.2 10*3/uL (ref 4.0–10.5)

## 2010-08-08 LAB — DIFFERENTIAL
Lymphocytes Relative: 37 % (ref 12–46)
Lymphs Abs: 1.9 10*3/uL (ref 0.7–4.0)
Neutro Abs: 2.8 10*3/uL (ref 1.7–7.7)
Neutrophils Relative %: 54 % (ref 43–77)

## 2010-08-08 LAB — URINALYSIS, ROUTINE W REFLEX MICROSCOPIC
Bilirubin Urine: NEGATIVE
Hgb urine dipstick: NEGATIVE
Nitrite: NEGATIVE
Specific Gravity, Urine: 1.008 (ref 1.005–1.030)
pH: 7.5 (ref 5.0–8.0)

## 2010-08-08 LAB — HEPATIC FUNCTION PANEL
ALT: 18 U/L (ref 0–35)
AST: 19 U/L (ref 0–37)
Albumin: 3.8 g/dL (ref 3.5–5.2)
Alkaline Phosphatase: 79 U/L (ref 39–117)
Total Protein: 6.7 g/dL (ref 6.0–8.3)

## 2010-08-08 LAB — POCT I-STAT, CHEM 8
Creatinine, Ser: 0.7 mg/dL (ref 0.4–1.2)
Glucose, Bld: 96 mg/dL (ref 70–99)
Hemoglobin: 14.3 g/dL (ref 12.0–15.0)
Sodium: 140 mEq/L (ref 135–145)
TCO2: 26 mmol/L (ref 0–100)

## 2010-08-08 LAB — LIPASE, BLOOD: Lipase: 31 U/L (ref 11–59)

## 2010-08-16 ENCOUNTER — Ambulatory Visit: Payer: 59 | Admitting: Internal Medicine

## 2010-08-18 NOTE — Consult Note (Signed)
Summary: GI/[facility]   Imported By: Sherian Rein 08/08/2010 12:52:39  _____________________________________________________________________  External Attachment:    Type:   Image     Comment:   External Document

## 2010-08-25 ENCOUNTER — Encounter: Payer: 59 | Attending: Family Medicine | Admitting: *Deleted

## 2010-08-25 DIAGNOSIS — K9089 Other intestinal malabsorption: Secondary | ICD-10-CM | POA: Insufficient documentation

## 2010-08-25 DIAGNOSIS — Z713 Dietary counseling and surveillance: Secondary | ICD-10-CM | POA: Insufficient documentation

## 2010-09-30 ENCOUNTER — Encounter: Payer: Self-pay | Admitting: Internal Medicine

## 2010-10-03 ENCOUNTER — Encounter: Payer: Self-pay | Admitting: Internal Medicine

## 2010-10-03 ENCOUNTER — Ambulatory Visit (INDEPENDENT_AMBULATORY_CARE_PROVIDER_SITE_OTHER): Payer: 59 | Admitting: Internal Medicine

## 2010-10-03 DIAGNOSIS — K3184 Gastroparesis: Secondary | ICD-10-CM

## 2010-10-03 DIAGNOSIS — R002 Palpitations: Secondary | ICD-10-CM

## 2010-10-03 DIAGNOSIS — R42 Dizziness and giddiness: Secondary | ICD-10-CM

## 2010-10-03 NOTE — Progress Notes (Signed)
HPI: Judy Gibbs is a 45 y.o. female Seen after 5 years for palpitations dyspnea on exertion.  I saw her 5 years ago for symptoms suggestive of dysautonomia. She also had some GI complaints. She was seen in consultation after that by Dr. Thomasene Mohair at [facility] who felt that a diagnosis of dysautonomia was not in order. 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 She also has dry eyes and dry mouth which is unrelenting. Attributing to her former symptoms she has had significant decrease in her food intake and has lost 60 pounds. The prognosis of this is not clear. Whether it is a motility disorder or autonomic condition is not clear to me.  Her cardiac issues include postprandial palpitations and sleepiness. She also has chronic lightheadedness and palpitations. When her husband Encarnacion Slates) takes her blood pressure home while she is lightheaded it is normal and her rhythm is regular.her about her diet now is largely limited to small volumes large cards low-protein and she is largely salt intolerant Current Outpatient Prescriptions  Medication Sig Dispense Refill  . ACCU-CHEK AVIVA PLUS test strip as directed.      . hyoscyamine (LEVSINEX) 0.375 MG 12 hr capsule Take 0.375 mg by mouth 2 (two) times daily as needed.        . Lancets (ACCU-CHEK MULTICLIX) lancets as directed.      . Multiple Vitamins-Minerals (ADEKS) chewable tablet Chew 3 tablets by mouth daily. Multivitamin gummie       . omeprazole (PRILOSEC) 40 MG capsule Take 1 tablet by mouth Twice daily.      . polyethylene glycol (MIRALAX / GLYCOLAX) packet Take 17 g by mouth 2 (two) times daily.        . promethazine (PHENERGAN) 6.25 MG/5ML syrup Take by mouth 4 (four) times daily as needed.        Marland Kitchen dexlansoprazole (DEXILANT) 60 MG capsule Take 60 mg by mouth daily.        . nortriptyline (PAMELOR) 25 MG capsule Take 25 mg by mouth at bedtime.        . ranitidine (ZANTAC) 300 MG tablet  Take 300 mg by mouth at bedtime.        . sucralfate (CARAFATE) 1 G tablet Take 1 g by mouth 4 (four) times daily.          Allergies  Allergen Reactions  . Celecoxib     REACTION: hives  . Eszopiclone     REACTION: insomnia    Past Medical History  Diagnosis Date  . Bipolar disorder   . DDD (degenerative disc disease), cervical   . Anxiety   . Depression   . Fibromyalgia   . GERD (gastroesophageal reflux disease)   . IBS (irritable bowel syndrome)   . Raynauds phenomenon   . Gastroparesis     Past Surgical History  Procedure Date  . Cholecystectomy   . Hysterectomy - unknown type   . Total knee arthroplasty 89  & 10    R & L    Family History  Problem Relation Age of Onset  . Colon polyps    . Colon cancer    . Esophageal cancer    . Diabetes    . Heart disease      History   Social History  . Marital Status: Married    Spouse Name: N/A    Number of Children: 2  . Years of Education: N/A   Occupational History  .  homemaker    Social History Main Topics  . Smoking status: Former Smoker    Quit date: 05/22/1982  . Smokeless tobacco: Not on file  . Alcohol Use: No  . Drug Use: No  . Sexually Active: Not on file   Other Topics Concern  . Not on file   Social History Narrative  . No narrative on file    Fourteen point review of systems was negative except as noted in HPI and PMH   PHYSICAL EXAMINATION  Blood pressure 120/73, pulse 63, height 5\' 6"  (1.676 m), weight 191 lb (86.637 kg).   Well developed and nourished weomanappearing her stated age  in no acute distress HENT normal Neck supple with JVP-flat Carotids brisk and full without bruits Back without scoliosis or kyphosis Clear Regular rate and rhythm, no murmurs or gallops Abd-soft with active BS without hepatomegaly or midline pulsation Femoral pulses 2+ distal pulses intact No Clubbing cyanosis edema Skin-warm and dry LN-neg submandibular and supraclavicular A & Oriented CN  3-12 normal  Grossly normal sensory and motor function Affect engaging .  ECG demonstrates sinus rhythm at 63 Intervals 0.15 respiratory 95.40 Axis is 74 Otherwise normal

## 2010-10-03 NOTE — Assessment & Plan Note (Signed)
The patient has postprandial palpitations and indeed palpitations today while her electrocardiogram is normal. We will check a Holter monitor to see if we can identify anything specific that might offer [place] a therapeutic target. I am not optimistic that we will find anything.

## 2010-10-03 NOTE — Assessment & Plan Note (Signed)
Her lightheadedness appears to be unrelated to blood pressure. We will check some postprandial blood pressures to see if there are any she is potentially triggered by splanchnic shunting.

## 2010-10-03 NOTE — Patient Instructions (Signed)
Your physician has requested that you have an echocardiogram. Echocardiography is a painless test that uses sound waves to create images of your heart. It provides your doctor with information about the size and shape of your heart and how well your heart's chambers and valves are working. This procedure takes approximately one hour. There are no restrictions for this procedure.  Your physician has recommended that you wear a 48 hour holter monitor. Holter monitors are medical devices that record the heart's electrical activity. Doctors most often use these monitors to diagnose arrhythmias. Arrhythmias are problems with the speed or rhythm of the heartbeat. The monitor is a small, portable device. You can wear one while you do your normal daily activities. This is usually used to diagnose what is causing palpitations/syncope (passing out).    

## 2010-10-03 NOTE — Assessment & Plan Note (Signed)
Is not clear to me what is the unifying diagnosis. I have asked the patient to discuss with her gastroenterologist whether a possible diagnosis of Protonix failure is appropriate to entertain. If so neurological confirmation of this may be necessary. In addition I've asked him to consider whether octreotide might be helpful for postprandial symptoms

## 2010-10-03 NOTE — Assessment & Plan Note (Signed)
Her dyspnea is associated with palpitations and may be related to rapid rates and/or left ventricular dysfunction related to malnutrition. We'll check an echo cardiogram

## 2010-10-04 ENCOUNTER — Telehealth: Payer: Self-pay | Admitting: Internal Medicine

## 2010-10-04 NOTE — Telephone Encounter (Signed)
All Cardiac Records were faxed to  1.[facility]/Dr.Warren Pickard @ [phone]. 2.[facility]Amy bridges RN @ [phone] 3.[facility] [employer] @ [phone] 10/04/10/KM
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
## 2010-10-04 NOTE — Telephone Encounter (Signed)
Faxed OV, EKG, Stress, Monitor & Tilt Table Test to Dr. Beverly Gust (per patient.

## 2010-10-07 NOTE — Op Note (Signed)
NAME:  AMORY, ZBIKOWSKI                          ACCOUNT NO.:  [id number]   MEDICAL RECORD NO.:  [id number]                   PATIENT TYPE:  AMB   LOCATION:  DAY                                  FACILITY:  [facility], [place]   PHYSICIAN:  Anselm Pancoast. Zachery Dakins, M.D.          DATE OF BIRTH:  08-04-1965   DATE OF PROCEDURE:  10/28/2002  DATE OF DISCHARGE:                                 OPERATIVE REPORT   PREOPERATIVE DIAGNOSIS:  Chronic cholecystitis.   POSTOPERATIVE DIAGNOSIS:  Chronic cholecystitis.   OPERATION/PROCEDURE:  Laparoscopic cholecystectomy with cholangiogram.   ANESTHESIA:  General.   SURGEON:  Anselm Pancoast. Zachery Dakins, M.D.   HISTORY:  Judy Gibbs is a 45 year old female who I met yesterday after she  had been referred for severe epigastric pain.  She has four episodes of  gallbladder  colic over the last two months.  The last one was most intense  that afternoon.  Thursday of last week she presented to the emergency room,  had lab studies, liver function studies.  Ultrasound showed gallstones and  was released after her symptoms subsided.  The liver tests were mildly  abnormal and she stated she had not had pain over the last 48 hours.  She  desired to proceed on with the laparoscopic cholecystectomy as promptly as  possible.  She has been dieting.  She used to weigh about 280 pounds; now  weighs about 245 and otherwise is in good health.   DESCRIPTION OF PROCEDURE:  Preoperatively she was given 3 g of Unasyn.  She  has PAS stockings and is taken to the operative suite.  Induction of general  anesthesia, endotracheal tube, oral tube to the stomach.  We then made a  small vertical incision after the abdomen had been prepped with Betadine  solution.  She had had a previous tubal ligation.  The fascia was identified  down about four inches in the adipose tissue.  This was picked up between  two Kochers.  A small opening was made and carefully entered into the  peritoneal cavity.   Pursestring suture of 0 Vicryl was placed and the Hasson  cannula inserted.  The gallbladder was tense and not acutely inflamed.  The  upper 10 mm trocar was placed after infiltration of the fascia with Marcaine  and adrenalin and the two lateral 5 mm trocar ports were placed by Dr. Kendrick Ranch.  The gallbladder was retracted upward and we carefully opened the  peritoneum proximally.  There was a little inflammation in the proximal  portion of the gallbladder and we identified where the junction of the  cystic duct and gallbladder is. The anterior branch of the cystic artery was  identified, freed up at a right angle, doubly clipped proximally and singly  distally and divided.  Then the gallbladder clipped near the junction of the  cystic duct and right gallbladder.  I clipped the junction, made a small  opening in the cystic duct just proximal to the clip, introduced a Cook  catheter held in place with a clip and x-ray that was obtained showed about  a 1.5 cm cystic duct.  No evidence of any common bile duct stones well into  the duodenum.  The catheter was then removed from the cystic duct.  Stump  was triply clipped and then divided.  The posterior branch of the cystic  artery was identified, doubly clipped proximally and then divided.  Gallbladder was freed from its bed using hook electrocautery.  I did place  the gallbladder within the EndoCatch bag. Good hemostasis.  Then we switched  the camera to the upper 10 mm port and we freed the gallbladder within the  EndoCatch bag.  At the fascia of the umbilicus we placed an additional  figure-of-eight suture of 0 Vicryl in addition to the pursestring.  Both  were tied and then the fascia of the umbilicus was anesthetized.  The  urinary fluid was clear.  This was aspirated and then 5 mm port was  withdrawn.  The carbon dioxide was released and the other 10 mm trocar  withdrawn and the  subcutaneous wounds were closed with 4-0 Vicryl and  Benzoin and Steri-Strips  for the skin.  The patient tolerated the procedure nicely.  She should be  able to be discharged in the morning in hopes that she will have no further  episodes of epigastric pain.                                                   Anselm Pancoast. Zachery Dakins, M.D.    WJW/MEDQ  D:  10/28/2002  T:  10/28/2002  Job:  [id number]

## 2010-10-07 NOTE — Op Note (Signed)
[facility]  Patient:    MARBELLA, MARKGRAF Visit Number: [id number] MRN: [id number]          Service Type: END Location: ENDO Attending Physician:  Mervin Hack Dictated by:   Hedwig Morton. Juanda Chance, M.D. [facility] Proc. Date: 07/04/01 Admit Date:  07/04/2001   CC:         Dr. ___________   Operative Report  PROCEDURE:  Upper endoscopy.  INDICATIONS FOR PROCEDURE:  This 45 year old white female has had nausea and vomiting at night. She has had choking on food at night, chest pain as well as epigastric pain. She had an upper endoscopy in 1995 showing gastritis. She also has a positive family history of esophageal cancer and some Barretts esophagus in her uncle and history of colon polyps. She has had significant improvement on Nexium and Reglan 5 mg at bedtime but she just had another episode of vomiting last night. She is undergoing upper endoscopy to further evaluate her reflux and to rule out gastric outlet obstruction.  ENDOSCOPE:  Olympus single channel videoscope.  SEDATION:  Versed 10 mg IV, Demerol 50 mg IV.  FINDINGS:  Olympus single channel videoscope passed under direct vision through the posterior pharynx into the esophagus. The patient was monitored by pulse oximeter, her oxygen saturations were normal. She was cooperative. Proximal and distal esophageal mucosa was normal with normal appearing squamocolumnar junction. There was no irregularity of the Z line, no hiatal hernia or stricture.  STOMACH:  The stomach was insufflated with air and showed a small amount of bowel along the greater curvature of the stomach. The mucosa was generally erythematous but there were no mucosal disruptions. Biopsies were taken for CLOtest as well as for regular H&E stains. The pyloric outlet was normal. Retroflexion of the endoscope revealed a normal fundus and cardia.  DUODENUM:  The duodenal bulb and descending duodenum was normal.  IMPRESSION:  1.  Nonspecific antral gastritis status post biopsies and CLOtest.  2. Bile reflux.  PLAN:  1. Continue Nexium 40 mg at bedtime, increase to b.i.d. temporarily.  2. Small meals at bedtime to prevent vomiting.  3. Increase Reglan from 5-10 mg at bedtime.  4. Antireflux measures.  5. The patient will schedule a colonoscopy because of rectal bleeding and     irregular bowel habits. Dictated by:   Hedwig Morton. Juanda Chance, M.D. [facility] Attending Physician:  Mervin Hack DD:  07/04/01 TD:  07/04/01 Job: [id number]

## 2010-10-07 NOTE — H&P (Signed)
NAMEMarland Kitchen  Judy, Gibbs                ACCOUNT NO.:  [id number]   MEDICAL RECORD NO.:  [id number]          PATIENT TYPE:  INP   LOCATION:  1827                         FACILITY:  [facility]   PHYSICIAN:  Georga Hacking, M.D.DATE OF BIRTH:  May 26, 1965   DATE OF ADMISSION:  09/15/2005  DATE OF DISCHARGE:                                HISTORY & PHYSICAL   REASON FOR ADMISSION:  Chest pain, dizziness, and weakness.   HISTORY:  The patient is a 45 year old female who has a history of severe  bipolar effective disorder and psychiatric problems.  She has been under  significant situational stress.  She has obesity.  She has been having  increasing anxiety as well as a hospitalization on the psychiatric service  and was discharged April 2 earlier this month following an overdose of  Valium.  She has had difficulty with extreme fatigue, a feeling of dizziness  when she is up and about as well as episodic chest discomfort.  She has been  concerned about the amount of fatigue she has with exertion as well as  occasional palpitations.  She was seen by Dr. Armanda Magic about two weeks  ago and was scheduled next week to have a Cardiolite stress test, an  echocardiogram, and a tilt table test.  She has had difficulty having to go  to bed with severe fatigue after any level of exertion and is dizzy when she  gets up and about.  She began to have chest pain underneath her left breast  that persisted all night and was out shopping with her daughter today.  She  was at [employer] and noticed that her legs began to feel funny and she began to  feel somewhat space and then became very weak.  She felt as if her heart was  pounding and, again, had pain under left breast, called EMS, and was brought  to the emergency room  by ambulance.  At the present time, she is not having  any chest pain but did complain of palpitations and noted that her blood  pressure was elevated earlier.  The chest pain is somewhat vague  in  description and has been present since last evening, tends to wax and wan,  it is not made worse with exertion.   PAST MEDICAL HISTORY:  Her past history is remarkable for bipolar effective  disorder.  She has obesity.  Cholesterol status unknown.  No history of  diabetes or hypertension.  She has a previous history of endoscopy several  years ago.   PAST SURGICAL HISTORY:  Hysterectomy, cholecystectomy, and tubal ligation.   ALLERGIES:  CELEBREX.   CURRENT MEDICATIONS:  She is on Lamictal 150 q.a.m., Prozac 40 mg q.a.m.,  Provigil 200 mg in the morning and at noon, Risperdal 0.25 mg in the evening  and in the evening, Seroquel 50 mg in the evening, Trazodone 50 mg in the  evening.   FAMILY HISTORY:  Father has had a stroke.  Her mother has hypertension and  hypercholesterolemia.  She has a brother and sister who are living.   SOCIAL HISTORY:  She has been married for 20 years and has a 54 year old  daughter who is developmentally challenged and there is situational stress  related to this.  Her husband is a Theatre stage manager.  She is a non-smoker and  does not use alcohol to excess.   REVIEW OF SYMPTOMS:  Obese for many years.  She has headaches.  She has  chronic fatigue.  She has a known history of depression.  There is a  possible history of lupus and significant arthritis.  She has a known  history of a cardiac murmur and may have mitral valve prolapse.  She has no  dysuria, urgency, frequency.  She has had endoscopy in the past.  She has  not had any recent diarrhea or hematochezia.  Psychiatric history is noted  above.  Other than as stated above, the remainder of the review of systems  is unremarkable.   PHYSICAL EXAMINATION:  GENERAL:  She is an obese female who is currently in no acute distress.  VITAL SIGNS:  Blood pressure is currently 130/70, pulse 70.  SKIN:  Warm and dry, healed acne form lesions over her face.  HEENT:  EOMI, PERRLA, CNS clear.  Fundi not  examined.  Pharynx negative.  NECK:  Supple without masses, JVD, thyromegaly, or bruits.  LUNGS:  Clear to A&P.  CARDIOVASCULAR:  Normal S1 and S2, no S3.  There is a mid systolic click and  a systolic murmur heard at the left sternal border.  ABDOMEN:  Soft and nontender, obese.  EXTREMITIES:  Femoral distal pulses are 2+, no edema.  NEUROLOGICAL:  Normal.  Affect appears somewhat anxious but was appropriate  otherwise.   12 lead EKG shows somewhat low voltage but has no acute abnormality.   IMPRESSION:  1.  A vague chest discomfort, rule out myocardial infarction or unstable      angina.  2.  Dizziness, rule out orthostasis.  3.  Bipolar disorder with previous overdose attempt.  4.  Obesity.  5.  Cardiac murmur, possible mitral valve prolapse by examination.   RECOMMENDATIONS:  Complex and difficult history with significant psychiatric  history.  At the present time, we will give her aspirin and I would like to  also treat her with Pepcid.  We will place her on the telemetry floor to  monitor for arrhythmia and check orthostatic blood pressures.  Dr. Mayford Knife is  also on call this weekend and will arrange for her to be seen in the morning  by Dr. Mayford Knife who has seen her previously and will try to get a stress  Cardiolite while she is in here.      Georga Hacking, M.D.  Electronically Signed     WST/MEDQ  D:  09/15/2005  T:  09/15/2005  Job:  [id number]   cc:   Armanda Magic, M.D.  Fax: [phone]   Joycelyn Rua, M.D.  Fax: [phone]   Sonny Masters, M.D.

## 2010-10-07 NOTE — Discharge Summary (Signed)
NAMEMarland Kitchen  Judy Gibbs, Judy Gibbs                ACCOUNT NO.:  [id number]   MEDICAL RECORD NO.:  [id number]          PATIENT TYPE:  IPS   LOCATION:  0503                          [facility]ACILITY:  [facility]   PHYSICIAN:  Anselm Jungling, MD  DATE OF BIRTH:  June 06, 1965   DATE OF ADMISSION:  08/14/2005  DATE OF DISCHARGE:  08/19/2005                                 DISCHARGE SUMMARY   IDENTIFYING DATA/REASON FOR ADMISSION:  This was the first [facility] admission for  Judy Gibbs, a 45 year old married white female admitted after what she claimed  was an unintentional overdose of Valium.  She indicated that this was not a  suicide attempt, at least initially.  She came to [place] as a patient of Dr.  Milford Cage, her outpatient psychiatrist.  She presented with multiple  family stressors, including a developmentally disabled 46 year old daughter  who had been increasingly unstable emotionally and behaviorally and had been  violent towards the patient.  Her husband is a IT sales professional who has been  unable to work recently.  Please refer to the admission note for further  details pertaining to the symptoms, circumstances and history that led to  her hospitalization.   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   MEDICAL/LABORATORY:  The patient was medically and physically evaluated by  the psychiatric nurse practitioner upon admission.  The patient reported  that she had been evaluated for certain gait abnormalities but had had a  normal EEG and MRI prior to admission.  There were no significant medical  issues during this brief inpatient psychiatric stay.   HOSPITAL COURSE:  The patient was admitted to the adult inpatient  psychiatric service.  She presented as a normally developed, well-nourished  adult female who was quite depressed, sad and tearful.  She indicated that  she had taken an excessive  amount of Valium with the intention of being able  to rest and sleep, but not to end her life.  However, she acknowledged that  she was extremely depressed, and overwhelmed by family stressors.   She came to [place] on a regimen of Lamictal 200 mg daily, Prozac 80 mg daily,  Valium 10 mg t.i.d., Klonopin 1 mg q.h.s., and trazodone 50 mg p.r.n.  insomnia.   The patient was a good participant in the treatment program.  During the  course of her inpatient stay, she announced at one point that she decided to  get honestand admit that her overdose of Valium had in fact been an  intentional suicide attempt.  She indicated that she was glad that she  survived it, but felt that she needed to be open about her previous  intentions because she had been suicidal for many years, and wanted help  with this.   The patient benefited greatly from the inpatient program and stated that she  got a lot out of inpatient groups and therapeutic activities.  Family  session occurred involving her husband, in which he was able to express a  lot  of heartfelt support for her, which was extremely encouraging.   By the sixth hospital day, the patient indicated that she felt ready for  discharge home, with a plan to follow-up in the intensive outpatient program  following her discharge.   DISCHARGE MEDICATIONS:  The patient was discharged on the following regimen:  1.  Lamictal 200 mg daily.  2.  Prozac 40 mg daily.  3.  Risperdal 0.25 mg q.a.m. and 0.5 mg q.h.s.  4.  Depakote ER 500 mg q.h.s.   FOLLOW UP:  She was to follow-up with her usual psychiatrist, Dr. Katrinka Blazing, on  August 22, 2005.  There was also a plan for her to start the [facility]  intensive outpatient psychiatric program on Monday, August 21, 2005.   DISCHARGE DIAGNOSES:  AXIS I:  Bipolar disorder, type 2, most recently  depressed.  AXIS II:  Deferred.  AXIS III:  No acute or chronic illnesses, history of fibromyalgia.  AXIS IV:  Stressors:  Severe.   AXIS V:  GAF on discharge 60.           ______________________________  Anselm Jungling, MD  Electronically Signed     SPB/MEDQ  D:  08/21/2005  T:  08/22/2005  Job:  [id number]

## 2010-10-07 NOTE — Discharge Summary (Signed)
NAME:  Judy Gibbs, WINNER NO.:  [id number]   MEDICAL RECORD NO.:  [id number]          PATIENT TYPE:  IPS   LOCATION:  0507                          FACILITY:  [facility]   PHYSICIAN:  Geoffery Lyons, M.D.      DATE OF BIRTH:  12-13-1965   DATE OF ADMISSION:  10/12/2006  DATE OF DISCHARGE:  10/17/2006                               DISCHARGE SUMMARY   CHIEF COMPLAINT AND PRESENT ILLNESS:  This was the third admission to  [facility] for this 45 year old white female, married,  voluntarily admitted.  She presented to the hospital with her husband  who endorsed that she had taken an overdose of medicine.  Claimed an  overdose of 58 tablets of her 1 mg Klonopin and then drank a bottle of  NyQuil, also 2 Goody powders.  She claimed that she started late  afternoon on Thursday, Oct 11, 2006.  She fell asleep.  Slept part of it  off and was brought to the hospital about 10 p.m.  Increased depression,  hopeless.  Triggered by caregiver stress.  Mother of a 31 year old  disabled daughter and a 46 year old daughter with bipolar illness.  Both  at her home.  Husband works a varying schedule, often away.  Had some  help but currently she is between caregivers.  Felt that she had to walk  on egg shells at home for fear of triggering temper problems in her 26-  year-old.   PAST PSYCHIATRIC HISTORY:  Followed by Fabio Pierce at [facility] and [facility] for counseling.  History of prior overdose on  Klonopin one year prior to this admission.  Last inpatient March of  2007.  At that time, stabilized on Lamictal 200 mg, Prozac 40 mg,  Risperdal 0.25 mg in the morning and 0.5 mg at night and Depakote 500 mg  at night.   ALCOHOL/DRUG HISTORY:  Denies active use of any substances.   MEDICAL HISTORY:  Fibromyalgia.   MEDICATIONS:  Promethazine 25 mg every six hours as needed for nausea,  nabumetone 750 mg, 1 twice a day for exacerbation of the fibromyalgia,  Lamictal 200 mg per day, Klonopin 1 mg twice a day, omeprazole 20 mg, 2  tablets in the morning, and Topamax 150 at night.  Had stopped the  Risperdal about two months prior to this admission as she was not able  to afford it.   PHYSICAL EXAMINATION:  Performed and failed to show any acute findings.   LABORATORY DATA:  Urine pregnancy test was negative.  UA within normal  limits.  CBC revealed white blood cells 5.3, hemoglobin 13.2.  Sodium  134, potassium 3.5, BUN 8, creatinine 1.1.  Glucose 100.  Liver enzymes  were within normal limits.   MENTAL STATUS EXAM:  Fully alert, present cooperative female, some  psychomotor retardation, somewhat lethargic still from the overdose but  ambulating with a steady gait.  Balance is good.  Speech was slow in  pace and soft in tone.  Affect depressed.  Mood depressed.  Endorsed  suicidal thoughts, thinking it would be better  not to be alive.  Upset  for having expressed her needs and the uproar that it caused in her  family, feeling that she is run over by her children, sense of  hopelessness and helplessness, very overwhelmed.  Cognition well-  preserved.   ADMISSION DIAGNOSES:  AXIS I:  Bipolar disorder.  AXIS II:  No diagnosis.  AXIS III:  Gastroesophageal reflux.  AXIS IV:  Moderate.  AXIS V:  GAF upon admission 36; highest GAF in the last year 68.   HOSPITAL COURSE:  She was admitted, started in individual and group  psychotherapy.  We maintained the Klonopin and she was started on some  Risperdal that was effective before.  She endorsed that she took that  Klonopin, called the therapist who called the husband.  She got up and  got more.  Off her other medications, unable to afford it.  The stress,  as already stated, is taking care of a disabled daughter and another  daughter who has bipolar disorder who she feels is out of control.  Had  been diagnosed bipolar, depressed.  She endorsed she is dealing with  taking care of her daughter.   The husband is gone long hours at work.  He is a Theatre stage manager and when he is not working as a IT sales professional, he is  remodeling a house.  She was endorsing the feeling that she felt the  family was going to be better without her, stating that she is falling  apart.  She continued to go to groups, interacting one-on-one with staff  and patients alike.  By Oct 14, 2006, she was starting to feel less  distraught, some irritability, some tearfulness.  She was insightful.  Looking at the stressors, events, the way that happened, possible other  causes for the way she was feeling.  Looking back, discontinued the  Risperdal as she could not afford it.  There was some issues with the in-  house therapist that was going to work with her daughter.  Got very  upset, was very upset, there were issues of self-esteem, personalizing  and catastrophizing.  We restored the Risperdal.  We scheduled a family  session.  We worked with CBT to addressed and challenge the distortion.  By the Oct 16, 2006, she was feeling better.  The session with the  husband went well.  They were able to identify things that they had to  do to make things better.  She was denying suicidal ideations.  By Oct 17, 2006, she was in full contact with reality.  There were no active  suicidal or homicidal ideation, no hallucinations, no delusions.  Willing to pursue outpatient treatment.  She felt she was ready to move  on.   DISCHARGE DIAGNOSES:  AXIS I:  Bipolar disorder, depressed.  AXIS II:  No diagnosis.  AXIS III:  Gastroesophageal reflux, fibromyalgia.  AXIS IV:  Moderate.  AXIS V:  GAF upon discharge 60.   DISCHARGE MEDICATIONS:  1. Klonopin 0.5 mg, 1 twice a day.  2. Relafen 750 mg, 1 twice a day.  3. Lamictal 200 mg, 1 daily.  4. Prilosec 40 mg per day.  5. Decreased Topamax and weaned off.  6. Risperdal 1 mg at bedtime.  7. Prozac 20 mg daily.   FOLLOWUP:  Abel Presto and Fabio Pierce.      Geoffery Lyons,  M.D.  Electronically Signed     IL/MEDQ  D:  11/06/2006  T:  11/07/2006  Job:  [id number] 

## 2010-10-07 NOTE — Op Note (Signed)
Judy Gibbs, Judy Gibbs                ACCOUNT NO.:  [id number]   MEDICAL RECORD NO.:  [id number]          PATIENT TYPE:  OIB   LOCATION:  2852                         FACILITY:  [facility]   PHYSICIAN:  Armanda Magic, M.D.     DATE OF BIRTH:  [birth year]   DATE OF PROCEDURE:  09/28/2005  DATE OF DISCHARGE:  09/28/2005                                 OPERATIVE REPORT   REFERRING PHYSICIAN:  Joycelyn Rua, M.D.   CHIEF COMPLAINT:  Presyncope and syncope.   PROCEDURE:  Tilt table test.   OPERATOR:  Armanda Magic, M.D.   COMPLICATIONS:  None.   This is a very pleasant 45 year old white female with a history of  neuropsychiatric illness on multiple psychiatric medications, who presents  with episodes of presyncope and syncope and now presents for tilt table  testing.   The patient was brought to the cardiac catheterization in the fasting,  nonsedated state.  Informed consent was obtained.  The patient was connected  to continuous heart rate and pulse oximetry monitoring and intermittent  blood pressure monitoring.  The patient was placed supine for five minutes  while blood pressure was measured.  Blood pressure ranged from 115/50 to  125/55 with heart rates in the 60s.  The patient was then tilted upright to  70 degrees for a total of 10 minutes and then began feeling dizzy.  Initial  upright blood pressure was 115/63 with a pulse of 85 but then she became  dizzy.  Blood pressure initially dropped to 96/53 with a heart rate of 90  and then down to 58/29 with a heart rate of 57 and she passed out.  She was  immediately placed supine with resolution of bradyarrhythmias as well as  hypotension.  The patient was subsequently transferred back to the day  hospital and later discharged to home in stable condition.   RESULTS:  Vasovagal syncope with positive tilt test for vasovagal syncope  with a decrease in heart rate and blood pressure that could be potentiated  by her Seroquel she is taking,  although the patient states that she is only  taking the Seroquel p.r.n. and her last dose was over a week ago.  She is  also on Lamictal, and I am not sure as to whether this could potentiate  vasovagal syncope.  She is already on 100 mg of Zoloft and given the fact  that her heart rate at baseline is 60, I would not recommend adding a beta  blocker at this time.  She already has a lot of problems with fatigue.   PLAN:  I am going to recommend sending her to [facility] to see Luella Cook, M.D., for further evaluation and treatment for  this patient who is markedly positive for vasovagal syncope.  Again, I told  her  to stop her Seroquel and her Lamictal and speak with her psychiatrist as to  what other types of medications she might be able to be on, and we will also  get her sent over to [facility] for further evaluation and help with  treatment plans.      Armanda Magic, M.D.  Electronically Signed     TT/MEDQ  D:  10/03/2005  T:  10/03/2005  Job:  [id number]   cc:   Luella Cook, M.D.   Joycelyn Rua, M.D.  Fax: [phone]

## 2010-10-07 NOTE — Discharge Summary (Signed)
Judy Gibbs, Judy Gibbs                ACCOUNT NO.:  [id number]   MEDICAL RECORD NO.:  [id number]          PATIENT TYPE:  INP   LOCATION:  4729                         FACILITY:  [facility]   PHYSICIAN:  Armanda Magic, M.D.     DATE OF BIRTH:  12/28/1965   DATE OF ADMISSION:  09/15/2005  DATE OF DISCHARGE:  09/16/2005                                 DISCHARGE SUMMARY   DISCHARGE DIAGNOSES:  1.  Atypical chest pain with a negative cardiac isoenzyme, as well as a      normal myocardial imaging test revealing no perfusion defects, EF of      68%.  2.  Bipolar affective disorder.  3.  Obesity.  4.  Status post hysterectomy.  5.  Status post cholecystectomy.  6.  Allergy to CELEBREX.  7.  Family history of stroke, hypertension and hypercholesterolemia.   HISTORY OF PRESENT ILLNESS:  Ms. Cronic is a 45 year old female with an  unfortunate severe bipolar affective disorder with psychiatric problems.  Recently, she was admitted to the psychiatric service with an overdose of  Valium.  She does have a significant anxiety.  On admission, she complained  of extreme fatigue, dizziness and chest pain.  She had been seen earlier,  approximately 2 weeks ago, by Dr. Mayford Knife, and was scheduled to have a  Cardiolite test, an echocardiogram, and a tilt table test; however, she  became so symptomatic that she presented to the emergency room, apparently  before these tests occurred.  During the patient's hospitalization, as I  mentioned above, her Cardiolite study was normal.  Her electrocardiogram  shows normal sinus rhythm with no acute changes, dated September 15, 2005.  Her  laboratory studies were essentially unremarkable, including her TSH which  was 1.999.  Cardiac isoenzymes were negative x3.   Incidentally, the patient's chest x-ray was non-acute.  On September 16, 2005,  the patient had undergone the stress imaging study, which was negative.  At  this point, it was felt that the patient could go home because of  her  resolved chest pain and negative enzymes, as well as normal Cardiolite.   DISCHARGE MEDICATIONS:  She was instructed to continue her home medications,  which include:  1.  Lamictal 150 mg a day.  2.  Prozac 40 mg in the morning.  3.  Provigil 200 mg in the morning and at noon.  4.  Risperdal 1 mg q.h.s.  5.  Seroquel 50 mg in the evening.  6.  Trazodone 50 mg in the evening.   FOLLOWUP:  She is to follow up with Dr. Mayford Knife for an echo and an office  visit the week following her discharge.   DIET:  She has no restrictions.   ACTIVITY:  She has no restrictions.      Guy Franco, P.A.      Armanda Magic, M.D.  Electronically Signed    LB/MEDQ  D:  10/23/2005  T:  10/23/2005  Job:  [id number]   cc:   Joycelyn Rua, M.D.  Fax: [phone]   Sonny Masters, M.D.

## 2010-10-07 NOTE — Letter (Signed)
June 28, 2006    Armanda Magic, M.D.  [address]  [place], [place] [address]   RE:  Judy Gibbs, Judy Gibbs  MRN:  [id number]  /  DOB:  Nov 14, 1965   Dear Gloris Manchester:   It was a pleasure to see Kritika Stukes today.  She is referred for  consideration of dysautonomia.   Her past medical history is relevant, so I will start with it.  She has  a history of manic depressive disorder with tendencies toward  significant depression, was hospitalized in March of 2007, and takes a  variety of medications for this.   She has a disabled daughter who requires 24/7 care and she is the care  Kanai Hilger for this.   Her major complaint is fatigue.  In April of 2007, she was admitted to  hospital because of chest pain.  She was found to have an elevated blood  pressure and she notes that her heart rate was in the high 100s while in  transit.  We were able to obtain the first electrocardiogram at [facility].  The heart rate is in the low 100s at that time.  She was admitted.  She  underwent a cardiac evaluation including a Myoview and event recorder  and an echo; they were mostly okay.   She was submitted for tilt table testing while in hospital and she had a  vasodepressor cardioinhibitory event.  Notably she had never had syncope  before this.   She does have problems with lightheadedness while sitting and standing.  There have been some issues with balance and she has seen Dr. Nash Shearer,  who did an evaluation for seizures, but she does recall any specific  thoughts that he offered regarding her balance.   She recently has been diagnosed with gastroparesis.  She saw Dr. Madilyn Fireman,  who did a gastric emptying study.  This was apparently abnormal.  She  has no GU complaints.  She does have some history of constipation.   From a rheumatological point of view, she has Raynaud's phenomenon.  She  also complains of a dry mouth and dry eyes.   She has 2 types of palpitations, Traci.  The first is a  tachy  palpitation that occurs a couple times a week, and the others are flip-  flops that occur a couple times a month.  She is intolerant of cold but  not of heat.  She has no shower intolerance.  She describes a history of  labile blood pressure; her husband is a EMT/fireman and apparently her  blood pressure normally is low and so the  aberration on April 2007 was striking to them.   Her past medical history is notable as above.  She also carries a  diagnosis of fibromyalgia.   PAST SURGICAL HISTORY:  Notable for cholecystectomy, hysterectomy and  facet joint injections.   SOCIAL HISTORY:  Primarily as noted previously.  She has a second  daughter, who is older.  She does not use cigarettes, alcohol or  recreational drugs.   CURRENT MEDICATIONS:  1. Fluoxetine 40.  2. Omeprazole 40.  3. Lamictal 150.  4. Risperdal 0.5 b.i.d.  5. Topamax 100 at bedtime.   SHE IS ALLERGIC TO CELEBREX.   EXAMINATION:  She is a middle aged Caucasian female appearing somewhat  older than her stated age of 21.  Her blood pressure is 135/88 with a pulse of 70 initially.  We then did  orthostatics with a blood pressure 115/80 with a  pulse of 77 lying.  Sitting it was 122/83 with a pulse of 77.  Standing at 0 minutes it was  125/83 with a pulse of 93.  The heart rate increased a little bit more  to 97 with a blood pressure 123/87 and the blood pressure was 134/86  with a pulse of  94.  HEENT:  Demonstrated no trismus or xanthoma.  The neck veins were flat.  The carotids were brisk and full bilaterally  without bruit.  BACK:  Without kyphosis or scoliosis.  LUNGS:  Clear.  HEART:  Sounds were regular without murmurs or gallops.  ABDOMEN:  Soft with active bowel sounds without midline pulsation or  hepatomegaly.  Femoral pulses were 2+, distal pulses were intact and there was no  clubbing, cyanosis or edema.  NEUROLOGICAL:  Grossly normal.  SKIN:  Warm and dry.   Inspiratory expiratory ratios  were quite normal with a ratio of about  1.3 to 1.4 to 1.   IMPRESSION:  1. Fatigue - extreme with history of snoring.  2. Fibromyalgia.  3. History of anxiety and bipolar disorder.  4. Gastroparesis.  5. History of tachy palpitations.  6. Mild orthostasis manifested by a change in heart rate, but an      increase in blood pressure.  7. Extreme psychosocial stress at home.  8. Palpitations.      a.     Tachy palpitations.      b.     Flip-flops.   Traci, Mrs. Rashad is extremely tired.  She snores and her habitus  certainly suggest the possibility of sleep apnea and I wonder whether it  is not worth pursuing this.  She is also cold intolerant and so I  suggest we get a CBC and a TSH.  We were not able to get them before the  lab closed, but we will plan to do so.   I am not sure what the ultimate evaluation of her autonomic nervous  system is.  She certainly has normal innervation of her heart as it  relates to inspiratory expiratory ratios.  She does have mild  orthostasis.  The gastroparesis and the constipation also suggest some  potential autonomic involvement, though the picture does not coalesce  with the history and the other findings.  I wonder whether there are  other mechanisms for that.  I also wonder whether her rheumatological  condition may be more systemic and contribute to the dry eyes and dry  mouth as well.   I suggested that we do the sleep study and the other labs.  I offered  her the opportunity to use an event recorder to clarify the mechanism of  the palpitations.  She is not interested in doing this at this time.   Traci, thanks very much for asking me to see her.  I am sorry that I  cannot be more help in trying to clarify her issue more fully and I  wonder whether one of the neurologists may be able to do it if that  question remains.    Sincerely,      Duke Salvia, MD, [facility]  Electronically Signed   SCK/MedQ  DD: 06/28/2006  DT:  06/29/2006  Job #: [id number]   CC:   Everardo All. Madilyn Fireman, M.D.  Pollyann Savoy, M.D.

## 2010-10-13 ENCOUNTER — Ambulatory Visit (HOSPITAL_COMMUNITY): Payer: 59 | Attending: Family Medicine | Admitting: Radiology

## 2010-10-13 ENCOUNTER — Encounter (INDEPENDENT_AMBULATORY_CARE_PROVIDER_SITE_OTHER): Payer: 59

## 2010-10-13 DIAGNOSIS — I379 Nonrheumatic pulmonary valve disorder, unspecified: Secondary | ICD-10-CM | POA: Insufficient documentation

## 2010-10-13 DIAGNOSIS — R002 Palpitations: Secondary | ICD-10-CM

## 2010-10-13 DIAGNOSIS — I059 Rheumatic mitral valve disease, unspecified: Secondary | ICD-10-CM | POA: Insufficient documentation

## 2010-10-18 ENCOUNTER — Ambulatory Visit: Payer: 59 | Admitting: *Deleted

## 2010-10-19 ENCOUNTER — Telehealth: Payer: Self-pay | Admitting: Internal Medicine

## 2010-10-19 NOTE — Telephone Encounter (Signed)
Pt husband calling re pt monitor results.

## 2010-10-19 NOTE — Telephone Encounter (Signed)
The patient is aware of her results.  

## 2010-10-26 ENCOUNTER — Telehealth: Payer: Self-pay | Admitting: Internal Medicine

## 2010-10-26 NOTE — Telephone Encounter (Signed)
Referral back to Dr. Renie Ora. Phone  [phone].

## 2010-10-26 NOTE — Telephone Encounter (Signed)
Spoke with pt, the pt has seen dr Renie Ora in the past for dysautonmia and she would like to go back to her instead of going to dr cook at [facility]. Will make the referral call tomorrow. Judy Gibbs

## 2010-11-01 ENCOUNTER — Encounter: Payer: 59 | Attending: Family Medicine | Admitting: *Deleted

## 2010-11-01 DIAGNOSIS — K9089 Other intestinal malabsorption: Secondary | ICD-10-CM | POA: Insufficient documentation

## 2010-11-01 DIAGNOSIS — Z713 Dietary counseling and surveillance: Secondary | ICD-10-CM | POA: Insufficient documentation

## 2010-12-27 ENCOUNTER — Telehealth: Payer: Self-pay | Admitting: Internal Medicine

## 2010-12-27 NOTE — Telephone Encounter (Signed)
Pt wants to know if she can come back to [facility] for her care instead of [facility]. Dr. Leone Payor please advise.

## 2010-12-27 NOTE — Telephone Encounter (Signed)
Pt scheduled to see Dr. Leone Payor 01/27/11@3 :45pm. Pt aware of appt date and time.

## 2010-12-27 NOTE — Telephone Encounter (Signed)
Pt having lightheadedness when eating and drinking, pls advise

## 2010-12-27 NOTE — Telephone Encounter (Signed)
I spoke with the patient. She states that she is having a lot of problems with her GI motility disorder and that after she eats and drinks, she does feel lightheaded. At this point, she is very discouraged that her care has been shifted outside of [facility] because of the compassion that she receives from Dr. Graciela Husbands and Dr. Leone Payor. She has been referred to [facility] to she an autonomic specialist. She has not taken her BP's right after she eats/drinks and starts to feel lightheaded. I have advised her to do this to see what her readings are. I explained most of her problems sound GI related, but she is having some SOB as well. I have advised I will forward this message to Dr. Graciela Husbands to review once he is back in town next week. I will call her after that, but have explained Dr. Odessa Fleming recommendations may be limited. She is agreeable.

## 2010-12-27 NOTE — Telephone Encounter (Signed)
I can see her - I had sent her to Dr. Alycia Rossetti for help but not a transfer of care Will be weeks before I can see her, though - I suspect

## 2011-01-10 NOTE — Telephone Encounter (Signed)
I thought this had been routed to your desktop. Sorry.

## 2011-01-26 NOTE — Telephone Encounter (Signed)
Did you remember seeing this message?

## 2011-01-27 ENCOUNTER — Ambulatory Visit: Payer: 59 | Admitting: Internal Medicine

## 2011-02-01 ENCOUNTER — Emergency Department (HOSPITAL_COMMUNITY)
Admission: EM | Admit: 2011-02-01 | Discharge: 2011-02-01 | Disposition: A | Discharge status: Home / Self Care | Payer: 59 | Attending: Emergency Medicine | Admitting: Emergency Medicine

## 2011-02-01 ENCOUNTER — Emergency Department (HOSPITAL_COMMUNITY): Payer: 59

## 2011-02-01 DIAGNOSIS — Z79899 Other long term (current) drug therapy: Secondary | ICD-10-CM | POA: Insufficient documentation

## 2011-02-01 DIAGNOSIS — J3489 Other specified disorders of nose and nasal sinuses: Secondary | ICD-10-CM | POA: Insufficient documentation

## 2011-02-01 DIAGNOSIS — R490 Dysphonia: Secondary | ICD-10-CM | POA: Insufficient documentation

## 2011-02-01 DIAGNOSIS — J40 Bronchitis, not specified as acute or chronic: Secondary | ICD-10-CM | POA: Insufficient documentation

## 2011-02-01 DIAGNOSIS — R509 Fever, unspecified: Secondary | ICD-10-CM | POA: Insufficient documentation

## 2011-02-01 DIAGNOSIS — F3130 Bipolar disorder, current episode depressed, mild or moderate severity, unspecified: Secondary | ICD-10-CM | POA: Insufficient documentation

## 2011-02-01 DIAGNOSIS — R0789 Other chest pain: Secondary | ICD-10-CM | POA: Insufficient documentation

## 2011-02-01 DIAGNOSIS — K219 Gastro-esophageal reflux disease without esophagitis: Secondary | ICD-10-CM | POA: Insufficient documentation

## 2011-02-01 DIAGNOSIS — R059 Cough, unspecified: Secondary | ICD-10-CM | POA: Insufficient documentation

## 2011-02-01 DIAGNOSIS — R05 Cough: Secondary | ICD-10-CM | POA: Insufficient documentation

## 2011-02-01 LAB — POCT I-STAT TROPONIN I: Troponin i, poc: 0 ng/mL (ref 0.00–0.08)

## 2011-02-06 ENCOUNTER — Telehealth: Payer: Self-pay | Admitting: Internal Medicine

## 2011-02-06 DIAGNOSIS — R0602 Shortness of breath: Secondary | ICD-10-CM

## 2011-02-06 DIAGNOSIS — R6884 Jaw pain: Secondary | ICD-10-CM

## 2011-02-06 NOTE — Telephone Encounter (Addendum)
Pt called and was in ED Weds night.  She had an abnormal EKG and had labwork done.  Can this be checked and see what the EKG/lab showed.  Please call patient back with this information after being checked. Pt is having jaw pain/chest pain and elevated blood pressure this week end.  Pt is very concerned.

## 2011-02-06 NOTE — Telephone Encounter (Signed)
ER records pulled. I will review with Dr. Graciela Husbands when he is back tomorrow.

## 2011-02-08 NOTE — Telephone Encounter (Signed)
Pt calling back re not getting a call back re msg from 9-17, pt very concerned and would like a call back today

## 2011-02-08 NOTE — Telephone Encounter (Signed)
Dr.Klein has reviewed her ER records and has recommended a stress myoview test to R/O ischemia. I called her and reviewed all instructions and advised that she will get a call back from a [facility] in a day or two with a date and time for the test.

## 2011-02-16 ENCOUNTER — Encounter: Payer: Self-pay | Admitting: *Deleted

## 2011-02-22 ENCOUNTER — Ambulatory Visit (HOSPITAL_COMMUNITY): Payer: 59 | Attending: Internal Medicine | Admitting: Radiology

## 2011-02-22 DIAGNOSIS — R6884 Jaw pain: Secondary | ICD-10-CM | POA: Insufficient documentation

## 2011-02-22 DIAGNOSIS — R0602 Shortness of breath: Secondary | ICD-10-CM

## 2011-02-22 DIAGNOSIS — R9431 Abnormal electrocardiogram [ECG] [EKG]: Secondary | ICD-10-CM

## 2011-02-22 DIAGNOSIS — R079 Chest pain, unspecified: Secondary | ICD-10-CM

## 2011-02-22 MED ORDER — TECHNETIUM TC 99M TETROFOSMIN IV KIT
33.0000 | PACK | Freq: Once | INTRAVENOUS | Status: AC | PRN
  Administered 2011-02-22: 33 via INTRAVENOUS

## 2011-02-22 MED ORDER — TECHNETIUM TC 99M TETROFOSMIN IV KIT
11.0000 | PACK | Freq: Once | INTRAVENOUS | Status: AC | PRN
  Administered 2011-02-22: 11 via INTRAVENOUS

## 2011-02-22 NOTE — Progress Notes (Signed)
[facility] SITE 3 NUCLEAR MED [address] [place] [address] [phone]  Cardiology Nuclear Med Study  MARSIA CINO is a 45 y.o. female [id number] 10-Apr-1966   Nuclear Med Background Indication for Stress Test:  Evaluation for Ischemia, Post Hospital-02/01/11 CP, R/o MI and Abnormal EKG History:  Abnormal EKG, 10/13/10 Echo: 60-65% and 2007 Myocardial Perfusion Study: EF 68% NL Cardiac Risk Factors: Family History - CAD and History of Smoking  Symptoms:  Chest Pain, Dizziness, DOE, Fatigue, Light-Headedness and Palpitations   Nuclear Pre-Procedure Caffeine/Decaff Intake:  None NPO After: 6pm   Lungs:  clear IV 0.9% NS with Angio Cath:  22g  IV Site: L Antecubital  IV Started by:  Bonnita Levan, RN  Chest Size (in):  32 Cup Size: C  Height: 5\' 6"  (1.676 m)  Weight:  183 lb (83.008 kg)  BMI:  Body mass index is 29.54 kg/(m^2). Tech Comments:  N/A    Nuclear Med Study 1 or 2 day study: 1 day  Stress Test Type:  Stress  Reading MD: Kristeen Miss, MD  Order Authorizing Provider:  Dr. Odessa Fleming  Resting Radionuclide: Technetium 41m Tetrofosmin  Resting Radionuclide Dose: 11.0 mCi   Stress Radionuclide:  Technetium 34m Tetrofosmin  Stress Radionuclide Dose: 33.0 mCi           Stress Protocol Rest HR: 83 Stress HR: 146  Rest BP: 132/86 Stress BP: 159/73  Exercise Time (min): 6:00 METS: 7.0   Predicted Max HR: 174 bpm % Max HR: 105.17 bpm Rate Pressure Product: 21308   Dose of Adenosine (mg):  n/a Dose of Lexiscan: n/a mg  Dose of Atropine (mg): n/a Dose of Dobutamine: n/a mcg/kg/min (at max HR)  Stress Test Technologist: Milana Na, EMT-P  Nuclear Technologist:  Domenic Polite, CNMT     Rest Procedure:  Myocardial perfusion imaging was performed at rest 45 minutes following the intravenous administration of Technetium 43m Tetrofosmin. Rest ECG: NSR  Stress Procedure:  The patient exercised for 6:00.  The patient stopped due to fatigue,  doe, and denied any chest pain.  There were non specific ST-T wave changes.  Technetium 53m Tetrofosmin was injected at peak exercise and myocardial perfusion imaging was performed after a brief delay. Stress ECG: No significant change from baseline ECG  QPS Raw Data Images:  Normal; no motion artifact; normal heart/lung ratio. Stress Images:  There is mild apical thinning with normal uptake in other regions. Rest Images:  There is mild apical thinning with normal uptake in other regions. Subtraction (SDS):  No evidence of ischemia. Transient Ischemic Dilatation (Normal <1.22):  1.03 Lung/Heart Ratio (Normal <0.45):  0.26  Quantitative Gated Spect Images QGS EDV:  85 ml QGS ESV:  29 ml QGS cine images:  NL LV Function; NL Wall Motion QGS EF: 66%  Impression Exercise Capacity:  Fair exercise capacity. BP Response:  Normal blood pressure response. Clinical Symptoms:  No chest pain. ECG Impression:  No significant ST segment change suggestive of ischemia. Comparison with Prior Nuclear Study: No images to compare  Overall Impression:  Low risk stress nuclear study.  There is mild apical thinning but no evidence of ischemia.  Normal LV function with an EF of 66%.    Vesta Mixer, Montez Hageman., MD, [facility]

## 2011-02-23 ENCOUNTER — Telehealth: Payer: Self-pay | Admitting: Internal Medicine

## 2011-02-23 NOTE — Telephone Encounter (Signed)
Stress test results

## 2011-02-23 NOTE — Telephone Encounter (Signed)
I spoke with the patient regarding her results.  

## 2011-03-06 ENCOUNTER — Ambulatory Visit (INDEPENDENT_AMBULATORY_CARE_PROVIDER_SITE_OTHER): Payer: 59 | Admitting: Internal Medicine

## 2011-03-06 ENCOUNTER — Encounter: Payer: Self-pay | Admitting: Internal Medicine

## 2011-03-06 VITALS — BP 116/72 | HR 72 | Ht 66.5 in | Wt 185.4 lb

## 2011-03-06 DIAGNOSIS — K3184 Gastroparesis: Secondary | ICD-10-CM

## 2011-03-06 DIAGNOSIS — R1084 Generalized abdominal pain: Secondary | ICD-10-CM

## 2011-03-06 DIAGNOSIS — R109 Unspecified abdominal pain: Secondary | ICD-10-CM

## 2011-03-06 DIAGNOSIS — G8929 Other chronic pain: Secondary | ICD-10-CM

## 2011-03-06 DIAGNOSIS — K589 Irritable bowel syndrome without diarrhea: Secondary | ICD-10-CM

## 2011-03-06 NOTE — Patient Instructions (Addendum)
Return in 1 year, soon if needed. We will get your records from Dr. Cheron Schaumann.

## 2011-03-06 NOTE — Progress Notes (Signed)
  Subjective:    Patient ID: Judy Gibbs, female    DOB: [birth year], 45 y.o.   MRN: [id number]  HPI  Abd wall injections were unhelpful x 3 To get a celiac block (Dr. Roderic Ovens at [facility]) Delaying that due to daughters issues with pseudoseizures Here to get re-established. She saw Dr. 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 Dr. Berton Mount has suggested she has this and that post-prandial fatigue is a part of that. She was tried on Remeron but the extreme appetite and hunger issues were problematic so she stopped it. It made her abdominal pain worse.  She is now in process of waiting to see [facility].  Last spell of vomiting has been 3 weeks. Intense nausea has been main issue and there is chronic abdominal pain. 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 Review of Systems Tress, anxiety    Objective:   Physical Exam General: milldy anxious NAD Eyes: anicteric Lungs: clear Heart: S1S2 no rubs, murmurs or gallops Abdomen: soft and diffusely tender, BS+, no splash           Assessment & Plan:

## 2011-03-07 NOTE — Assessment & Plan Note (Signed)
Await results of celiac block 

## 2011-03-07 NOTE — Assessment & Plan Note (Signed)
Continue current therapy and see me annually or as needed.

## 2011-03-07 NOTE — Assessment & Plan Note (Signed)
Await results of celiac block

## 2011-03-13 ENCOUNTER — Other Ambulatory Visit: Payer: Self-pay | Admitting: Obstetrics & Gynecology

## 2011-03-13 DIAGNOSIS — Z1231 Encounter for screening mammogram for malignant neoplasm of breast: Secondary | ICD-10-CM

## 2011-03-24 ENCOUNTER — Ambulatory Visit (INDEPENDENT_AMBULATORY_CARE_PROVIDER_SITE_OTHER): Payer: 59 | Admitting: Internal Medicine

## 2011-03-24 ENCOUNTER — Telehealth: Payer: Self-pay | Admitting: Internal Medicine

## 2011-03-24 ENCOUNTER — Other Ambulatory Visit (INDEPENDENT_AMBULATORY_CARE_PROVIDER_SITE_OTHER): Payer: 59

## 2011-03-24 ENCOUNTER — Encounter: Payer: Self-pay | Admitting: Internal Medicine

## 2011-03-24 DIAGNOSIS — K589 Irritable bowel syndrome without diarrhea: Secondary | ICD-10-CM

## 2011-03-24 DIAGNOSIS — R109 Unspecified abdominal pain: Secondary | ICD-10-CM

## 2011-03-24 DIAGNOSIS — R1084 Generalized abdominal pain: Secondary | ICD-10-CM

## 2011-03-24 DIAGNOSIS — K3184 Gastroparesis: Secondary | ICD-10-CM

## 2011-03-24 DIAGNOSIS — N3010 Interstitial cystitis (chronic) without hematuria: Secondary | ICD-10-CM

## 2011-03-24 LAB — CBC WITH DIFFERENTIAL/PLATELET
Basophils Relative: 0.8 % (ref 0.0–3.0)
Hemoglobin: 14.7 g/dL (ref 12.0–15.0)
Lymphocytes Relative: 35.8 % (ref 12.0–46.0)
MCHC: 34 g/dL (ref 30.0–36.0)
Monocytes Relative: 6.5 % (ref 3.0–12.0)
Neutro Abs: 2.5 10*3/uL (ref 1.4–7.7)
RBC: 4.97 Mil/uL (ref 3.87–5.11)

## 2011-03-24 MED ORDER — GI COCKTAIL ~~LOC~~: 15.0000 mL | Freq: Three times a day (TID) | ORAL | Status: DC | PRN

## 2011-03-24 NOTE — Progress Notes (Signed)
Quick Note:  Her CBC is entirely normal. Will see how she does with the GI cocktail. When you call her with results please ask her how her symptoms are. ______

## 2011-03-24 NOTE — Progress Notes (Signed)
  Subjective:    Patient ID: Judy Gibbs, female    DOB: [birth year], 45 y.o.   MRN: [id number]  HPI This 45 year old white woman has functional GI problems with gastroparesis, chronic constipation and IBS as well as fibromyalgia and interstitial cystitis. She called the office today with complaints of several days of abdominal pain bloating and distention with nausea. She has not vomited. She is taking her MiraLax and moving her bowels regularly. There's been no fever. She thinks she has some swollen glands in her throat area but she's not had any upper respiratory infection complaints and nobody at home is ill. Her daughter does have persistent problems with pseudoseizures and that is a strain.   Review of Systems Interstitial cystitis is controlled on her Flomax    Objective:   Physical Exam Middle-age white woman, in mild distress though not toxic. Lungs clear Heart S1-S2 no rubs or gallops The abdomen is soft and nondistended bowel sounds are present but not increased and there is no succussion splash. She has tenderness in the right lower quadrant right mid quadrant and epigastrium. Perhaps more intense in the right lower quadrant. I can press deeply there is no rebound or guarding.       Assessment & Plan:  I suspect this is a flare of her functional problems. I'm going to check a CBC and prescribe GI cocktail to see if that helps. She is describing intense sensitivity to liquids and foods with the epigastric pain and abdominal distention and nausea. Her promethazine is helping some. She is intolerant of other medications and anti-spasmodic about helped. She has some tramadol which is minimally effective at this time and can continue that. She will stay on a liquid diet and advance as tolerated. If things worsen she knows to call back otherwise we will call lab results back in the next few days.

## 2011-03-24 NOTE — Assessment & Plan Note (Signed)
Not vomiting but bloating and miserable with nausea

## 2011-03-24 NOTE — Patient Instructions (Signed)
Please go to the basement upon leaving today to have your labs done. Your prescription(s) has(have) been sent to your pharmacy for you to pick up (GI cocktail). Stay on a liquid diet and advance as tolerated. Follow-up to see Dr. Leone Payor as needed.

## 2011-03-24 NOTE — Assessment & Plan Note (Signed)
This is a part of her current problem I think.

## 2011-03-24 NOTE — Telephone Encounter (Signed)
Patient reports severe abdominal pain from the breast bone to my groin. She states that she is not constipated.  She describes a large swollen abdomen, nausea.  She denies fever, diarrhea or constipation.  Per Dr. Elberta Leatherwood patient needs an appt with Dr Leone Payor today at 4:00

## 2011-03-24 NOTE — Assessment & Plan Note (Signed)
Relatively refractory in that it's been difficult to illuminate. I think this is a flare of her chronic recurrent pain. Await CBC and response to GI cocktail.

## 2011-03-29 NOTE — Progress Notes (Signed)
Quick Note:  She should ride it out using steps of gastroparesis diet and if unable to advance then call back. ______

## 2011-04-17 ENCOUNTER — Ambulatory Visit: Payer: 59 | Admitting: Internal Medicine

## 2011-04-19 ENCOUNTER — Telehealth: Payer: Self-pay | Admitting: Internal Medicine

## 2011-04-19 NOTE — Telephone Encounter (Signed)
She has had an adequate evaluation to exclude celiac. She can ask PCP about these non-GI issues.

## 2011-04-19 NOTE — Telephone Encounter (Signed)
Patient advised.

## 2011-04-19 NOTE — Telephone Encounter (Signed)
Patient reports that she is having burning in her lips when she eats yeast she will also get redness.  She reports other foods have given her blisters in the back of her throat.  She had a tTg with Dr Madilyn Fireman 05/25/09 that was negative (results in [facility] in an office note from [facility] 05/24/2009).  She had a normal Endo with Dr Madilyn Fireman in 2008 that showed a normal duodenum.  She denies diarrhea but reports bloating.  Patient also has a history on Gastroparesis.  Dr Leone Payor please advise.

## 2011-05-04 ENCOUNTER — Telehealth: Payer: Self-pay | Admitting: Internal Medicine

## 2011-05-04 MED ORDER — PROMETHAZINE HCL 6.25 MG/5ML PO SYRP: 6.2500 mg | ORAL_SOLUTION | Freq: Four times a day (QID) | ORAL | Status: DC | PRN

## 2011-05-04 NOTE — Telephone Encounter (Signed)
Medication refilled

## 2011-05-05 ENCOUNTER — Other Ambulatory Visit: Payer: Self-pay | Admitting: Internal Medicine

## 2011-05-05 ENCOUNTER — Ambulatory Visit: Payer: 59

## 2011-05-05 NOTE — Progress Notes (Signed)
Patient actually wanted phenergan syrup to go to [facility] @ [place]. I have spoken to Meridian @ [employer] and he states that he will have prescription transferred to their pharmacy for patient.

## 2011-05-08 ENCOUNTER — Telehealth: Payer: Self-pay | Admitting: Internal Medicine

## 2011-05-08 NOTE — Telephone Encounter (Signed)
Let her know the promethazine (Phenergan) is an as needed medication. We can refill with 300 cc a month but if she is taking this every day than that suggests she needs a different approach to treating her nausea. And nausea is not always from the stomach.  I am expecting her not to take this every single day.

## 2011-05-08 NOTE — Telephone Encounter (Signed)
Talked to patient earlier concerning medication (Phenergan) and she stated that she takes medication 2-4 tsp 2-3 times per day. Called patient to let her know Dr. Marvell Fuller suggestions and left a message on voicemail for her to return my call

## 2011-05-09 MED ORDER — PROMETHAZINE HCL 6.25 MG/5ML PO SYRP: ORAL_SOLUTION | ORAL | Status: DC

## 2011-05-09 NOTE — Telephone Encounter (Signed)
Patient informed of Dr. Marvell Fuller orders. Patient stated that as long as she is taking her Miralax daily and drinking apple juice that her constipation is under control. Patient stated that she has heard of this new medication and would be willing to try it. Medication called in for 2 16oz bottles.

## 2011-05-09 NOTE — Telephone Encounter (Signed)
Patient called back stating that she needed 2 16oz bottles of the medication a month for severe gastroparesis with severe nausea. States Dr. Adriana Simas at [facility] would give her the medication for 2-4 tsp 2-3 times per day. If she doesn't take the medication like that she won't be able to eat.  I let her know that I would inform Dr. Leone Payor.

## 2011-05-09 NOTE — Telephone Encounter (Signed)
1) Ok to refill this for 2 months total and then needs follow-up - 2 16 oz bottles - you may need to call pharmacy to straighten out 2) It is going to be best to see if we can come up with a way to use less of the Phenergan 3) There is a new medication for constipation that could possibly help - Linzess - how is she doing with constipation these days - I may want her to try this med

## 2011-05-09 NOTE — Telephone Encounter (Signed)
Will discuss new medication at next visit

## 2011-05-10 NOTE — Telephone Encounter (Signed)
Noted  

## 2011-05-18 ENCOUNTER — Ambulatory Visit
Admission: RE | Admit: 2011-05-18 | Discharge: 2011-05-18 | Disposition: A | Discharge status: Home / Self Care | Payer: 59 | Source: Ambulatory Visit | Attending: Obstetrics & Gynecology | Admitting: Obstetrics & Gynecology

## 2011-05-18 DIAGNOSIS — Z1231 Encounter for screening mammogram for malignant neoplasm of breast: Secondary | ICD-10-CM

## 2011-05-23 HISTORY — PX: DIVERTING ILEOSTOMY: SHX5799

## 2011-05-26 ENCOUNTER — Other Ambulatory Visit: Payer: Self-pay | Admitting: Internal Medicine

## 2011-05-26 MED ORDER — OMEPRAZOLE 40 MG PO CPDR: 40.0000 mg | DELAYED_RELEASE_CAPSULE | Freq: Two times a day (BID) | ORAL | Status: DC

## 2011-05-26 NOTE — Telephone Encounter (Signed)
Medication filled.  

## 2011-07-18 ENCOUNTER — Encounter: Payer: Self-pay | Admitting: Internal Medicine

## 2011-08-06 ENCOUNTER — Other Ambulatory Visit: Payer: Self-pay | Admitting: Internal Medicine

## 2011-08-07 ENCOUNTER — Telehealth: Payer: Self-pay | Admitting: Internal Medicine

## 2011-08-07 NOTE — Telephone Encounter (Signed)
Pt requested refill on her Promethazine HCL , consulted with Sheri.  The pharmacy was called and told ok to dispense 2 bottles of 16 oz. Size.  Pt informed that pharmacy getting the refill ready for pick up.  Marchelle Folks had already sent the refill through the computer, we just adjusted the amount.

## 2011-08-24 ENCOUNTER — Telehealth: Payer: Self-pay | Admitting: Internal Medicine

## 2011-08-24 NOTE — Telephone Encounter (Signed)
Patient is requesting a refill again on phenergan.  Last refill was authorized 08/06/11.  Per phone note in Dec patient needed to come in for a follow up.  I have scheduled her an appt for 09/14/11, she was offered an earlier appt, but declined.  She is advised that no refills until she sees Dr Leone Payor

## 2011-09-01 ENCOUNTER — Encounter: Payer: Self-pay | Admitting: Internal Medicine

## 2011-09-01 ENCOUNTER — Ambulatory Visit (INDEPENDENT_AMBULATORY_CARE_PROVIDER_SITE_OTHER): Payer: 59 | Admitting: Internal Medicine

## 2011-09-01 VITALS — BP 112/74 | HR 84 | Ht 66.5 in | Wt 193.0 lb

## 2011-09-01 DIAGNOSIS — R1084 Generalized abdominal pain: Secondary | ICD-10-CM

## 2011-09-01 DIAGNOSIS — G901 Familial dysautonomia [Riley-Day]: Secondary | ICD-10-CM

## 2011-09-01 DIAGNOSIS — G909 Disorder of the autonomic nervous system, unspecified: Secondary | ICD-10-CM

## 2011-09-01 DIAGNOSIS — K3184 Gastroparesis: Secondary | ICD-10-CM

## 2011-09-01 DIAGNOSIS — R110 Nausea: Secondary | ICD-10-CM

## 2011-09-01 MED ORDER — PROMETHAZINE HCL 6.25 MG/5ML PO SOLN: ORAL | Status: DC

## 2011-09-01 NOTE — Patient Instructions (Signed)
We have called a refill of phenergan into your pharmacy. Follow up in 1 year or sooner if needed

## 2011-09-01 NOTE — Progress Notes (Signed)
  Subjective:    Patient ID: Judy Gibbs, female    DOB: November 29, 1965, 46 y.o.   MRN: [id number]  HPI The patient returns for followup of chronic nausea, chronic recurrent epigastric pain, gastroparesis in the setting of dysautonomia. She's been having a flare of her pain and nausea having to increase the amount of promethazine she has been using. She uses an elixir. Her pain physician is trying to get her back with Dr.Koch, [facility]. She has failed to respond to abdominal wall injection and celiac plexus block. She is going to [facility] in July, for evaluation of her dysautonomia since the doctor at [facility] is no longer in practice.  She is moving her bowels regularly with MiraLax.  She vomited some month or so ago but is not vomiting now. She is not eating all that well, eating crackers and one boost a day and has been drinking water. She just did get some Gatorade or Powerade to drink. She received some IV fluids at her primary care office earlier this week and was started on ciprofloxacin for a urinary tract infection.  Medications, allergies, past medical history, past surgical history, family history and social history are reviewed and updated in the EMR.   Review of Systems As above    Objective:   Physical Exam General:  NAD does look mildly ill with flat affect Eyes:   anicteric Lungs:  clear Heart:  S1S2 no rubs, murmurs or gallops Abdomen:  soft and mildly tender in the epigastrium, BS+         Assessment & Plan:   1. Chronic nausea   2. Abdominal pain, chronic, recurrent   3. Dysautonomia   4. Gastroparesis    Things seemed to be worsening for this unfortunate lady with these chronic problems. It's not and they were somewhat she's had in the past. Modification of medication regimen is beyond my expertise at this point. There are plans for her to see the motility specialist at [facility] again, I will refill her promethazine at this time. We'll see her in a year  routinely, sooner as needed.  Cc: Dr. Claire Shown, dr. Gilmore Laroche, Dr. Roderic Ovens [facility])

## 2011-09-01 NOTE — Assessment & Plan Note (Signed)
Flaring

## 2011-09-14 ENCOUNTER — Ambulatory Visit: Payer: 59 | Admitting: Internal Medicine

## 2011-11-21 ENCOUNTER — Telehealth: Payer: Self-pay | Admitting: Internal Medicine

## 2011-11-21 NOTE — Telephone Encounter (Signed)
Error

## 2011-11-21 NOTE — Telephone Encounter (Signed)
Patient reports abdominal pain and nausea.  She is not able to eat.  She was referred to see Dr. Alycia Rossetti at [facility], but her appt is not until Sept.  She will come in and see Willette Cluster RNP tomorrow at 3:30

## 2011-11-22 ENCOUNTER — Ambulatory Visit: Payer: 59 | Admitting: Nurse Practitioner

## 2011-12-06 ENCOUNTER — Encounter (HOSPITAL_COMMUNITY): Payer: Self-pay | Admitting: *Deleted

## 2011-12-06 ENCOUNTER — Emergency Department (HOSPITAL_COMMUNITY)
Admission: EM | Admit: 2011-12-06 | Discharge: 2011-12-06 | Disposition: A | Discharge status: Home / Self Care | Payer: 59 | Attending: Emergency Medicine | Admitting: Emergency Medicine

## 2011-12-06 DIAGNOSIS — R110 Nausea: Secondary | ICD-10-CM | POA: Insufficient documentation

## 2011-12-06 DIAGNOSIS — R0609 Other forms of dyspnea: Secondary | ICD-10-CM | POA: Insufficient documentation

## 2011-12-06 DIAGNOSIS — F319 Bipolar disorder, unspecified: Secondary | ICD-10-CM | POA: Insufficient documentation

## 2011-12-06 DIAGNOSIS — R0989 Other specified symptoms and signs involving the circulatory and respiratory systems: Secondary | ICD-10-CM | POA: Insufficient documentation

## 2011-12-06 DIAGNOSIS — E669 Obesity, unspecified: Secondary | ICD-10-CM | POA: Insufficient documentation

## 2011-12-06 DIAGNOSIS — R0600 Dyspnea, unspecified: Secondary | ICD-10-CM

## 2011-12-06 DIAGNOSIS — M5030 Other cervical disc degeneration, unspecified cervical region: Secondary | ICD-10-CM | POA: Insufficient documentation

## 2011-12-06 DIAGNOSIS — K219 Gastro-esophageal reflux disease without esophagitis: Secondary | ICD-10-CM | POA: Insufficient documentation

## 2011-12-06 DIAGNOSIS — Z87891 Personal history of nicotine dependence: Secondary | ICD-10-CM | POA: Insufficient documentation

## 2011-12-06 DIAGNOSIS — IMO0001 Reserved for inherently not codable concepts without codable children: Secondary | ICD-10-CM | POA: Insufficient documentation

## 2011-12-06 DIAGNOSIS — R109 Unspecified abdominal pain: Secondary | ICD-10-CM

## 2011-12-06 LAB — URINALYSIS, ROUTINE W REFLEX MICROSCOPIC
Bilirubin Urine: NEGATIVE
Glucose, UA: NEGATIVE mg/dL
Hgb urine dipstick: NEGATIVE
Specific Gravity, Urine: 1.012 (ref 1.005–1.030)
pH: 7.5 (ref 5.0–8.0)

## 2011-12-06 MED ORDER — PROMETHAZINE HCL 25 MG/ML IJ SOLN
25.0000 mg | Freq: Once | INTRAMUSCULAR | Status: AC
  Administered 2011-12-06: 25 mg via INTRAVENOUS
  Filled 2011-12-06: qty 1

## 2011-12-06 MED ORDER — SODIUM CHLORIDE 0.9 % IV BOLUS (SEPSIS)
1000.0000 mL | Freq: Once | INTRAVENOUS | Status: AC
  Administered 2011-12-06: 1000 mL via INTRAVENOUS

## 2011-12-06 MED ORDER — ONDANSETRON HCL 4 MG/2ML IJ SOLN
INTRAMUSCULAR | Status: AC
  Administered 2011-12-06: 20:00:00
  Filled 2011-12-06: qty 2

## 2011-12-06 NOTE — ED Provider Notes (Signed)
History     CSN: [id number]  Arrival date & time 12/06/11  Judy Gibbs   First MD Initiated Contact with Patient 12/06/11 2134      Chief Complaint  Patient presents with  . Abdominal Pain    (Consider location/radiation/quality/duration/timing/severity/associated sxs/prior treatment) HPI Comments: Patient has a known history of gastroparesis and dysautonomia.  Patient notes that she has significant problems with nausea and takes Phenergan daily.  She had increased stress today which often sets off her episodes.  She had worsening abdominal pain, nausea and difficulty breathing.  Her daughter called EMS because of her mom's difficulty breathing.  Patient notes that this happens with her dysautonomia and by the time of my evaluation the patient does not feel short of breath.  She notes that her abdominal pain is controlled but she does still have significant nausea.  She's had no fevers.  No recent abdominal surgeries.  No dysuria.  No changes in her bowel habits.  Patient is a 46 y.o. female presenting with abdominal pain. The history is provided by the patient.  Abdominal Pain The primary symptoms of the illness include abdominal pain, shortness of breath, nausea and vomiting. The primary symptoms of the illness do not include fever, diarrhea or dysuria.  Symptoms associated with the illness do not include chills or back pain.    Past Medical History  Diagnosis Date  . Bipolar disorder   . DDD (degenerative disc disease), cervical   . Anxiety   . Depression   . Fibromyalgia   . GERD (gastroesophageal reflux disease)   . IBS (irritable bowel syndrome)   . Raynauds phenomenon   . Gastroparesis   . Colonic inertia   . Proctalgia   . Pelvic floor dysfunction   . Hypoglycemia   . Interstitial cystitis   . Obesity     Past Surgical History  Procedure Date  . Cholecystectomy   . Hysterectomy - unknown type   . Total knee arthroplasty 89  & 10    R & L  . Colonoscopy 08/08/2001   normal, Dr. Lina Sar  . Upper gastrointestinal endoscopy 06/15/2006    hiatal hernia, gastritis  . Esophagogastroduodenoscopy 12/20/1993    mild antral gastritis, Dr. Lina Sar  . Edg 06/28/1987    Dr. Matthias Hughs, bile in stomach  . Upper gastrointestinal endoscopy 07/04/2001    Dr. Lina Sar    Family History  Problem Relation Age of Onset  . Colon polyps Paternal Uncle   . Esophageal cancer Paternal Uncle   . Diabetes Father   . Heart disease Father   . Stroke Father   . Hyperlipidemia Father   . Hyperlipidemia Mother     History  Substance Use Topics  . Smoking status: Former Smoker    Quit date: 05/22/1982  . Smokeless tobacco: Never Used  . Alcohol Use: No    OB History    Grav Para Term Preterm Abortions TAB SAB Ect Mult Living                  Review of Systems  Constitutional: Negative.  Negative for fever and chills.  HENT: Negative.   Eyes: Negative.  Negative for discharge and redness.  Respiratory: Positive for shortness of breath. Negative for cough.   Cardiovascular: Negative.  Negative for chest pain.  Gastrointestinal: Positive for nausea, vomiting and abdominal pain. Negative for diarrhea.  Genitourinary: Negative.  Negative for dysuria.  Musculoskeletal: Negative.  Negative for back pain.  Skin: Negative.  Negative for  color change and rash.  Neurological: Negative.  Negative for syncope and headaches.  Hematological: Negative.  Negative for adenopathy.  Psychiatric/Behavioral: Negative.  Negative for confusion.  All other systems reviewed and are negative.    Allergies  Celecoxib and Eszopiclone  Home Medications   Current Outpatient Rx  Name Route Sig Dispense Refill  . ACCU-CHEK AVIVA PLUS VI STRP  as directed.    Marland Kitchen CALCIUM-VITAMIN D 250-125 MG-UNIT PO TABS Oral Take 1 tablet by mouth daily.    Marland Kitchen CLONAZEPAM 0.5 MG PO TABS Oral Take 0.5 mg by mouth 3 (three) times daily as needed. For anxiety    . ACCU-CHEK MULTICLIX LANCETS MISC  as  directed.    Marland Kitchen ADEKS PO CHEW Oral Chew 2 tablets by mouth daily. Multivitamin gummie    . OMEPRAZOLE 40 MG PO CPDR Oral Take 1 capsule (40 mg total) by mouth 2 (two) times daily. 60 capsule 5  . POLYETHYLENE GLYCOL 3350 PO PACK Oral Take 17 g by mouth 3 (three) times daily.     Marland Kitchen PROMETHAZINE HCL 6.25 MG/5ML PO SOLN  2-4 teaspoons 2-3 times a day as needed for nausea 1592 mL 5  . TAMSULOSIN HCL 0.4 MG PO CAPS Oral Take 0.4 mg by mouth daily.      . TRAMADOL HCL 50 MG PO TABS Oral Take 50 mg by mouth every 6 (six) hours as needed. For pain      BP 110/63  Pulse 72  Temp 97.9 F (36.6 C) (Oral)  SpO2 99%  Physical Exam  Nursing note and vitals reviewed. Constitutional: She is oriented to person, place, and time. She appears well-developed and well-nourished.  Non-toxic appearance. She does not have a sickly appearance.  HENT:  Head: Normocephalic and atraumatic.  Eyes: Conjunctivae, EOM and lids are normal. Pupils are equal, round, and reactive to light. No scleral icterus.  Neck: Trachea normal and normal range of motion. Neck supple.  Cardiovascular: Normal rate, regular rhythm and normal heart sounds.  Exam reveals no gallop and no friction rub.   No murmur heard. Pulmonary/Chest: Effort normal and breath sounds normal. No respiratory distress. She has no wheezes. She has no rales.  Abdominal: Soft. Normal appearance. There is no tenderness. There is no rebound, no guarding and no CVA tenderness.  Musculoskeletal: Normal range of motion.  Neurological: She is alert and oriented to person, place, and time. She has normal strength.  Skin: Skin is warm, dry and intact. No rash noted.  Psychiatric: She has a normal mood and affect. Her behavior is normal. Judgment and thought content normal.    ED Course  Procedures (including critical care time)  Results for orders placed during the hospital encounter of 12/06/11  URINALYSIS, ROUTINE W REFLEX MICROSCOPIC      Component Value Range     Color, Urine YELLOW  YELLOW   APPearance CLEAR  CLEAR   Specific Gravity, Urine 1.012  1.005 - 1.030   pH 7.5  5.0 - 8.0   Glucose, UA NEGATIVE  NEGATIVE mg/dL   Hgb urine dipstick NEGATIVE  NEGATIVE   Bilirubin Urine NEGATIVE  NEGATIVE   Ketones, ur NEGATIVE  NEGATIVE mg/dL   Protein, ur NEGATIVE  NEGATIVE mg/dL   Urobilinogen, UA 0.2  0.0 - 1.0 mg/dL   Nitrite NEGATIVE  NEGATIVE   Leukocytes, UA NEGATIVE  NEGATIVE      1. Nausea   2. Abdominal pain   3. Dyspnea       MDM  Patient with some improvement in her nausea symptoms after the Phenergan here.  Since she states this episode was similar to multiple past episodes that she has had with her dysautonomia and her known gastroparesis she wishes to go home at this time.  She has a soft and benign abdomen on exam.  She has no fevers or other symptoms to suggest further infection or acute surgical process at this time.  Patient will be discharged home and will continue to followup with her GI specialist as an outpatient.        Nat Christen, MD 12/06/11 [phone]

## 2011-12-06 NOTE — ED Notes (Addendum)
Pt in via EMS- per EMS pt in c/o abd pain, states she has a history of dysautonomia and that stress increases her pain, pt in due to elevated pain at this time. Also c/o nausea, pt given 4mg  zofran IV PTA, 20g in LFA established by EMS

## 2012-02-02 DIAGNOSIS — Z888 Allergy status to other drugs, medicaments and biological substances status: Secondary | ICD-10-CM | POA: Insufficient documentation

## 2012-02-02 DIAGNOSIS — Z83719 Family history of colon polyps, unspecified: Secondary | ICD-10-CM | POA: Insufficient documentation

## 2012-02-02 DIAGNOSIS — R110 Nausea: Secondary | ICD-10-CM | POA: Insufficient documentation

## 2012-02-02 DIAGNOSIS — IMO0001 Reserved for inherently not codable concepts without codable children: Secondary | ICD-10-CM | POA: Insufficient documentation

## 2012-02-02 DIAGNOSIS — Z87891 Personal history of nicotine dependence: Secondary | ICD-10-CM | POA: Insufficient documentation

## 2012-02-02 DIAGNOSIS — F411 Generalized anxiety disorder: Secondary | ICD-10-CM | POA: Insufficient documentation

## 2012-02-02 DIAGNOSIS — G8929 Other chronic pain: Secondary | ICD-10-CM | POA: Insufficient documentation

## 2012-02-02 DIAGNOSIS — Z8249 Family history of ischemic heart disease and other diseases of the circulatory system: Secondary | ICD-10-CM | POA: Insufficient documentation

## 2012-02-02 DIAGNOSIS — Z823 Family history of stroke: Secondary | ICD-10-CM | POA: Insufficient documentation

## 2012-02-02 DIAGNOSIS — E669 Obesity, unspecified: Secondary | ICD-10-CM | POA: Insufficient documentation

## 2012-02-02 DIAGNOSIS — F319 Bipolar disorder, unspecified: Secondary | ICD-10-CM | POA: Insufficient documentation

## 2012-02-02 DIAGNOSIS — Z800 Family history of malignant neoplasm of digestive organs: Secondary | ICD-10-CM | POA: Insufficient documentation

## 2012-02-02 DIAGNOSIS — Z833 Family history of diabetes mellitus: Secondary | ICD-10-CM | POA: Insufficient documentation

## 2012-02-02 DIAGNOSIS — Z8489 Family history of other specified conditions: Secondary | ICD-10-CM | POA: Insufficient documentation

## 2012-02-02 DIAGNOSIS — K219 Gastro-esophageal reflux disease without esophagitis: Secondary | ICD-10-CM | POA: Insufficient documentation

## 2012-02-02 DIAGNOSIS — R109 Unspecified abdominal pain: Secondary | ICD-10-CM | POA: Insufficient documentation

## 2012-02-02 DIAGNOSIS — Z8371 Family history of colonic polyps: Secondary | ICD-10-CM | POA: Insufficient documentation

## 2012-02-03 ENCOUNTER — Emergency Department (HOSPITAL_COMMUNITY): Payer: 59

## 2012-02-03 ENCOUNTER — Encounter (HOSPITAL_COMMUNITY): Payer: Self-pay | Admitting: Emergency Medicine

## 2012-02-03 ENCOUNTER — Emergency Department (HOSPITAL_COMMUNITY)
Admission: EM | Admit: 2012-02-03 | Discharge: 2012-02-03 | Disposition: A | Discharge status: Home / Self Care | Payer: 59 | Attending: Emergency Medicine | Admitting: Emergency Medicine

## 2012-02-03 DIAGNOSIS — R110 Nausea: Secondary | ICD-10-CM

## 2012-02-03 DIAGNOSIS — R109 Unspecified abdominal pain: Secondary | ICD-10-CM

## 2012-02-03 HISTORY — DX: Dizziness and giddiness: R42

## 2012-02-03 LAB — URINALYSIS, ROUTINE W REFLEX MICROSCOPIC
Bilirubin Urine: NEGATIVE
Ketones, ur: NEGATIVE mg/dL
Nitrite: NEGATIVE
Protein, ur: NEGATIVE mg/dL
Specific Gravity, Urine: 1.017 (ref 1.005–1.030)
Urobilinogen, UA: 0.2 mg/dL (ref 0.0–1.0)

## 2012-02-03 LAB — CBC WITH DIFFERENTIAL/PLATELET
Eosinophils Absolute: 0.1 10*3/uL (ref 0.0–0.7)
Eosinophils Relative: 2 % (ref 0–5)
Hemoglobin: 13.4 g/dL (ref 12.0–15.0)
Lymphs Abs: 2.4 10*3/uL (ref 0.7–4.0)
MCH: 28.8 pg (ref 26.0–34.0)
MCV: 79.1 fL (ref 78.0–100.0)
Monocytes Relative: 8 % (ref 3–12)
Neutrophils Relative %: 40 % — ABNORMAL LOW (ref 43–77)
RBC: 4.65 MIL/uL (ref 3.87–5.11)

## 2012-02-03 LAB — COMPREHENSIVE METABOLIC PANEL
Alkaline Phosphatase: 85 U/L (ref 39–117)
BUN: 10 mg/dL (ref 6–23)
GFR calc Af Amer: >90 mL/min (ref >90)
Glucose, Bld: 91 mg/dL (ref 70–99)
Potassium: 3.7 mEq/L (ref 3.5–5.1)
Total Protein: 6.3 g/dL (ref 6.0–8.3)

## 2012-02-03 LAB — LIPASE, BLOOD: Lipase: 30 U/L (ref 11–59)

## 2012-02-03 MED ORDER — DIPHENHYDRAMINE HCL 50 MG/ML IJ SOLN
25.0000 mg | Freq: Once | INTRAMUSCULAR | Status: AC
  Administered 2012-02-03: 25 mg via INTRAVENOUS
  Filled 2012-02-03: qty 1

## 2012-02-03 MED ORDER — PROMETHAZINE HCL 25 MG/ML IJ SOLN
25.0000 mg | Freq: Once | INTRAMUSCULAR | Status: AC
  Administered 2012-02-03: 25 mg via INTRAVENOUS
  Filled 2012-02-03: qty 1

## 2012-02-03 MED ORDER — PROMETHAZINE HCL 25 MG RE SUPP: 25.0000 mg | Freq: Four times a day (QID) | RECTAL | Status: DC | PRN

## 2012-02-03 MED ORDER — KETOROLAC TROMETHAMINE 30 MG/ML IJ SOLN
30.0000 mg | Freq: Once | INTRAMUSCULAR | Status: AC
  Administered 2012-02-03: 30 mg via INTRAVENOUS
  Filled 2012-02-03: qty 1

## 2012-02-03 MED ORDER — SIMETHICONE 40 MG/0.6ML PO SUSP (UNIT DOSE)
40.0000 mg | Freq: Once | ORAL | Status: AC
  Administered 2012-02-03: 40 mg via ORAL
  Filled 2012-02-03: qty 0.6

## 2012-02-03 MED ORDER — DROPERIDOL 2.5 MG/ML IJ SOLN: 1.2500 mg | Freq: Once | INTRAMUSCULAR | Status: DC

## 2012-02-03 MED ORDER — SIMETHICONE 40 MG/0.6ML PO SUSP: 40.0000 mg | Freq: Four times a day (QID) | ORAL | Status: DC | PRN

## 2012-02-03 NOTE — ED Notes (Signed)
Pt able to ambulate to bathrm with minimal assistance

## 2012-02-03 NOTE — ED Provider Notes (Signed)
History     CSN: [id number]  Arrival date & time 02/02/12  2359   First MD Initiated Contact with Patient 02/03/12 0144      Chief Complaint  Patient presents with  . Abdominal Pain    (Consider location/radiation/quality/duration/timing/severity/associated sxs/prior treatment) HPI 46 year old female with history of irritable bowel syndrome, gastroparesis, and colonic inertia presents to the emergency room with severe right-sided abdominal pain ongoing for the last 2-3 days. 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 She was started on Bentyl and increased milk of magnesia dosing. She reports since that time she has had increased cramping and pain on the right side of her abdomen. This is the site of her normal pain. She reports her pain is normally crampy for may be an hour at most but this has been constant and ongoing. She is concerned she may have an obstruction. She reports approximately a month ago she was seen by her primary care Dr. who did x-rays and told her that she was impacted all along her right colon. Patient initially was having loose watery stools, but reports she is passing more stools with her last several bowel movements. She reports she's having up to 10-12 bowel movements a day. She denies any fever, no vomiting. She reports she is chronically nauseated and this has gotten worse over the last few days.   Past Medical History  Diagnosis Date  . Bipolar disorder   . DDD (degenerative disc disease), cervical   . Anxiety   . Depression   . Fibromyalgia   . GERD (gastroesophageal reflux disease)   . IBS (irritable bowel syndrome)   . Raynauds phenomenon   . Gastroparesis   . Colonic inertia   . Proctalgia   . Pelvic floor dysfunction   . Hypoglycemia   . Interstitial cystitis   . Obesity   . Vertigo     Past Surgical History  Procedure Date    . Cholecystectomy   . Hysterectomy - unknown type   . Total knee arthroplasty 89  & 10    R & L  . Colonoscopy 08/08/2001    normal, Dr. Lina Sar  . Upper gastrointestinal endoscopy 06/15/2006    hiatal hernia, gastritis  . Esophagogastroduodenoscopy 12/20/1993    mild antral gastritis, Dr. Lina Sar  . Edg 06/28/1987    Dr. Matthias Hughs, bile in stomach  . Upper gastrointestinal endoscopy 07/04/2001    Dr. Lina Sar    Family History  Problem Relation Age of Onset  . Colon polyps Paternal Uncle   . Esophageal cancer Paternal Uncle   . Diabetes Father   . Heart disease Father   . Stroke Father   . Hyperlipidemia Father   . Hyperlipidemia Mother     History  Substance Use Topics  . Smoking status: Former Smoker    Quit date: 05/22/1982  . Smokeless tobacco: Never Used  . Alcohol Use: No    OB History    Grav Para Term Preterm Abortions TAB SAB Ect Mult Living                  Review of Systems  All other systems reviewed and are negative.    Allergies  Celecoxib and Reglan  Home Medications   Current Outpatient Rx  Name Route Sig Dispense Refill  . ACCU-CHEK AVIVA PLUS VI STRP  as directed.    Marland Kitchen  CALCIUM-VITAMIN D 250-125 MG-UNIT PO TABS Oral Take 2 tablets by mouth daily.     Marland Kitchen CLONAZEPAM 0.5 MG PO TABS Oral Take 0.5 mg by mouth 3 (three) times daily as needed. For anxiety    . DICYCLOMINE HCL 10 MG/5ML PO SOLN Oral Take 10 mg by mouth 4 (four) times daily -  before meals and at bedtime.    Marland Kitchen ACCU-CHEK MULTICLIX LANCETS MISC  as directed.    Marland Kitchen ADEKS PO CHEW Oral Chew 2 tablets by mouth daily. Multivitamin gummie    . OMEPRAZOLE 40 MG PO CPDR Oral Take 40 mg by mouth daily.    Marland Kitchen PROMETHAZINE HCL 6.25 MG/5ML PO SOLN  2-4 teaspoons 2-3 times a day as needed for nausea 1592 mL 5  . TAMSULOSIN HCL 0.4 MG PO CAPS Oral Take 0.4 mg by mouth every morning.     Marland Kitchen TRAMADOL HCL 50 MG PO TABS Oral Take 50 mg by mouth every 6 (six) hours as needed. For pain    .  PROMETHAZINE HCL 25 MG RE SUPP Rectal Place 1 suppository (25 mg total) rectally every 6 (six) hours as needed for nausea. 12 each 0  . SIMETHICONE 40 MG/0.6ML PO SUSP Oral Take 0.6 mLs (40 mg total) by mouth 4 (four) times daily as needed (for abdominal bloating or cramping). 30 mL 0    BP 126/72  Pulse 72  Temp 98.4 F (36.9 C) (Oral)  Resp 16  Ht 5\' 6"  (1.676 m)  Wt 189 lb (85.73 kg)  BMI 30.51 kg/m2  SpO2 96%  Physical Exam  Nursing note and vitals reviewed. Constitutional: She is oriented to person, place, and time. She appears well-developed and well-nourished. She appears distressed (uncomfortable appearing).  HENT:  Head: Normocephalic and atraumatic.  Nose: Nose normal.  Mouth/Throat: Oropharynx is clear and moist.  Eyes: Conjunctivae normal and EOM are normal. Pupils are equal, round, and reactive to light.  Neck: Normal range of motion. Neck supple. No JVD present. No tracheal deviation present. No thyromegaly present.  Cardiovascular: Normal rate, regular rhythm, normal heart sounds and intact distal pulses.  Exam reveals no gallop and no friction rub.   No murmur heard. Pulmonary/Chest: Effort normal and breath sounds normal. No stridor. No respiratory distress. She has no wheezes. She has no rales. She exhibits no tenderness.  Abdominal: Soft. She exhibits no distension and no mass. There is tenderness (tenderness all along right abdomen,. Umbilical and epigastric. No pain in left lower or upper quadrant). There is no rebound and no guarding.       Decreased bowel sounds throughout  Musculoskeletal: Normal range of motion. She exhibits no edema and no tenderness.  Lymphadenopathy:    She has no cervical adenopathy.  Neurological: She is alert and oriented to person, place, and time. No cranial nerve deficit. She exhibits normal muscle tone. Coordination normal.  Skin: Skin is warm and dry. No rash noted. No erythema. No pallor.  Psychiatric: She has a normal mood and  affect. Her behavior is normal. Judgment and thought content normal.    ED Course  Procedures (including critical care time)  Labs Reviewed  CBC WITH DIFFERENTIAL - Abnormal; Notable for the following:    MCHC 36.4 (*)     Neutrophils Relative 40 (*)     Lymphocytes Relative 50 (*)     All other components within normal limits  COMPREHENSIVE METABOLIC PANEL - Abnormal; Notable for the following:    Total Bilirubin 0.2 (*)  All other components within normal limits  LIPASE, BLOOD  URINALYSIS, ROUTINE W REFLEX MICROSCOPIC   Dg Abd Acute W/chest  02/03/2012  *RADIOLOGY REPORT*  Clinical Data: Right-sided abdominal pain, nausea.  ACUTE ABDOMEN SERIES (ABDOMEN 2 VIEW & CHEST 1 VIEW)  Comparison: 07/16/2010 CT  Findings: The lungs are clear.  Cardiomediastinal contours within normal range.  No free intraperitoneal air.  Nonobstructive bowel gas pattern. Surgical clips right upper quadrant.  No acute osseous finding.  IMPRESSION: Nonobstructive bowel gas pattern.   Original Report Authenticated By: Waneta Martins, M.D.      1. Chronic abdominal pain   2. Nausea       MDM  46 year old female with right-sided abdominal pain and history of irritable bowel syndrome. X-rays without signs of bowel obstruction. Labs unremarkable.  Patient has close followup already ranged. Patient feeling better after simethicone and IV Phenergan and Benadryl. Discussed at length with patient that use of narcotic medication would only slow her bowel function further which is not in her best interest. Will discharge home with rectal Phenergan and simethicone.        Olivia Mackie, MD 02/03/12 [phone]

## 2012-02-03 NOTE — ED Notes (Signed)
Pt. Reminded for urinalysis, verbalized understanding . 

## 2012-02-03 NOTE — ED Notes (Signed)
Pt c/o severe abd pain x 2-3 days. Pt seen by PCP this week, dx with impaction by xray. Pt states pain and nausea worse.

## 2012-02-05 ENCOUNTER — Other Ambulatory Visit: Payer: Self-pay | Admitting: Family Medicine

## 2012-02-05 DIAGNOSIS — H8309 Labyrinthitis, unspecified ear: Secondary | ICD-10-CM

## 2012-02-09 ENCOUNTER — Ambulatory Visit
Admission: RE | Admit: 2012-02-09 | Discharge: 2012-02-09 | Disposition: A | Discharge status: Home / Self Care | Payer: 59 | Source: Ambulatory Visit | Attending: Family Medicine | Admitting: Family Medicine

## 2012-02-09 DIAGNOSIS — H8309 Labyrinthitis, unspecified ear: Secondary | ICD-10-CM

## 2012-04-09 ENCOUNTER — Encounter: Payer: Self-pay | Admitting: Internal Medicine

## 2012-05-25 IMAGING — RF DG SINUS / FISTULA TRACT / ABSCESSOGRAM
11 of 14 series · 17 of 24 positions shown · non-contrast
Comparison: [HOSPITAL] abdominal pelvic CT 07/16/2010.

***ADDENDUM*** CREATED: 08/05/2010 [DATE]

Addendum:  Report sent initially to incorrect referring clinician,
report subsequently correctly sent to Felahovic, Kel Dhe
***END ADDENDUM*** SIGNED BY: Felici Hc, M.D.
CLINICAL DATA: Assessment for rectovaginal fistula, stool and
vagina following intercourse with pelvic pain and bloating.
VAGINOGRAM/FISTULOGRAM
TECHNIQUE: A Bardex inflatable tube was placed into introitus of
vagina, with surgical towel wrapped around catheter, slight
Trendelenburg, and the patient instructed to crossed legs to reduce
leakage of contrast at introitus. A total of 100 mlml of 150 ml
Zmnipaque-4ZZ diluted with 100 ml waterwere slowly injected in four
separate injections(with slight leakage at introitus). Spot film
images were obtained.
Fluoroscopy Time: 1.8 minutes.

[Series 1: run · 1 of 1 slices shown (1 of 10)]
[im 1/1]
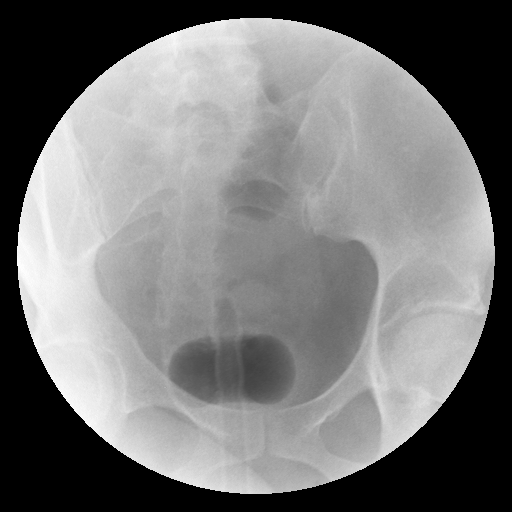

[Series 3: run · 1 of 1 slices shown (2 of 10)]
[im 1/1]
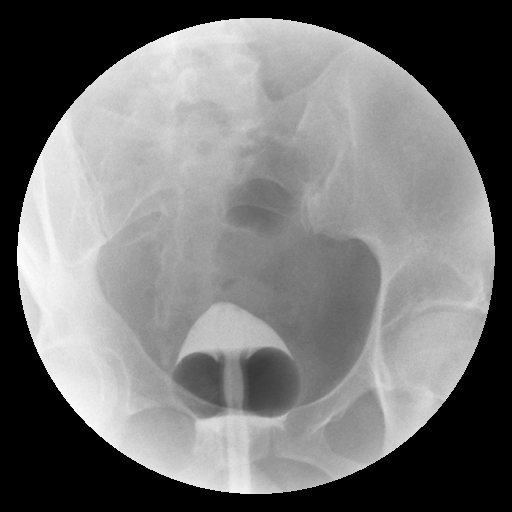

[Series 4: run · 2 of 4 slices shown (3 of 10)]
[im 1/4]
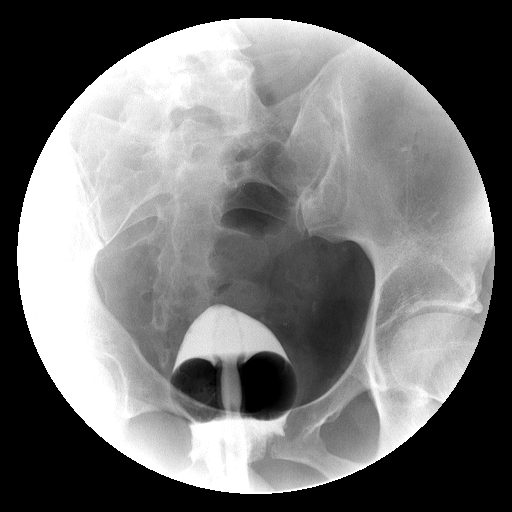
[im 4/4]
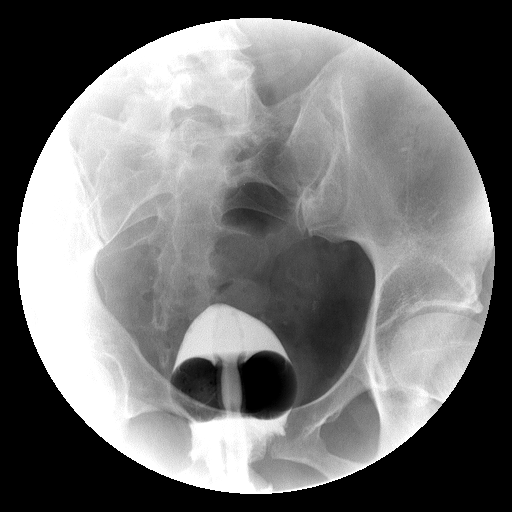

[Series 6: run · 1 of 1 slices shown (4 of 10)]
[im 1/1]
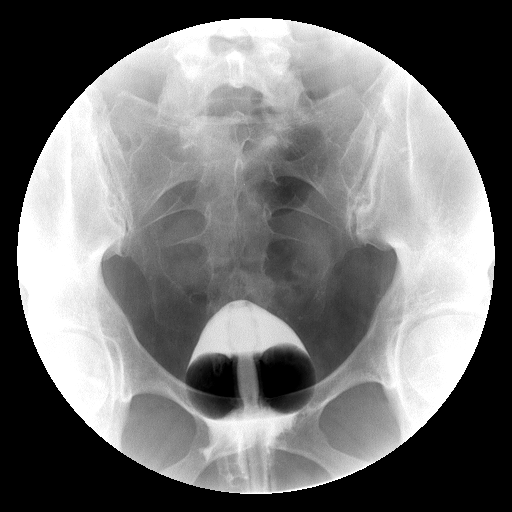

[Series 7: run · 1 of 3 slices shown (5 of 10)]
[im 1/3]
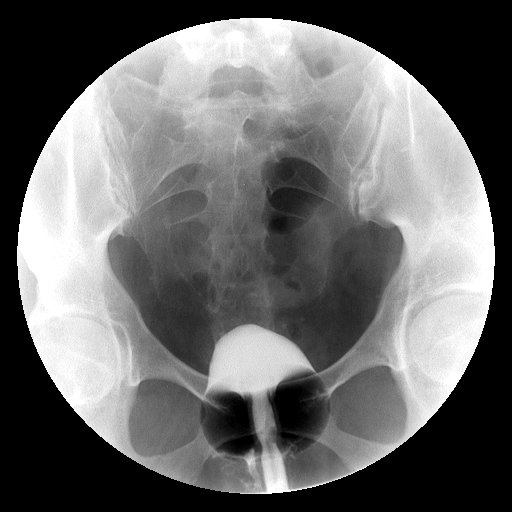

[Series 8: run · 1 of 4 slices shown (6 of 10)]
[im 4/4]
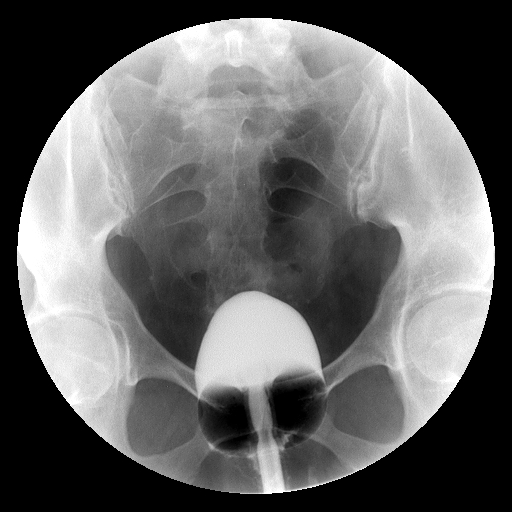

[Series 9: run · 3 of 6 slices shown (7 of 10)]
[im 1/6]
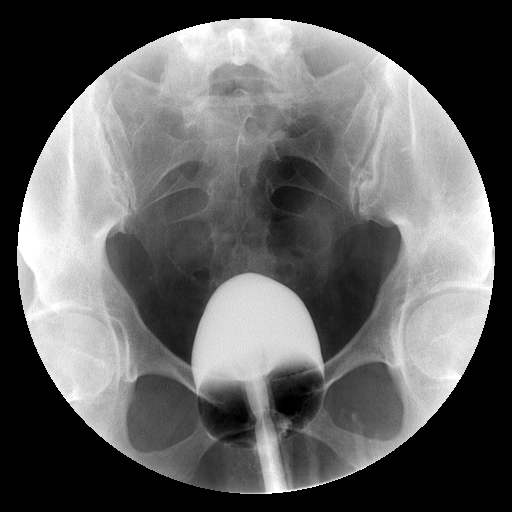
[im 4/6]
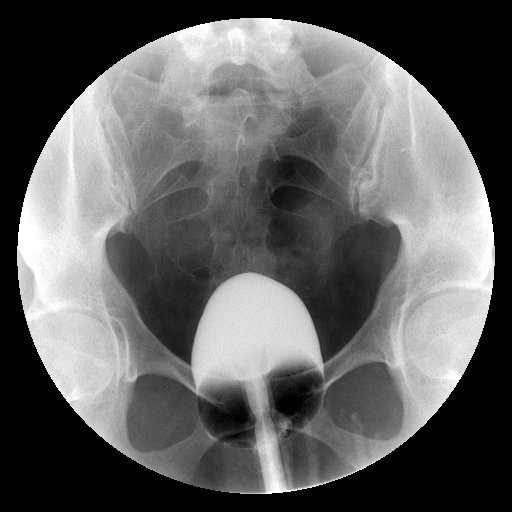
[im 6/6]
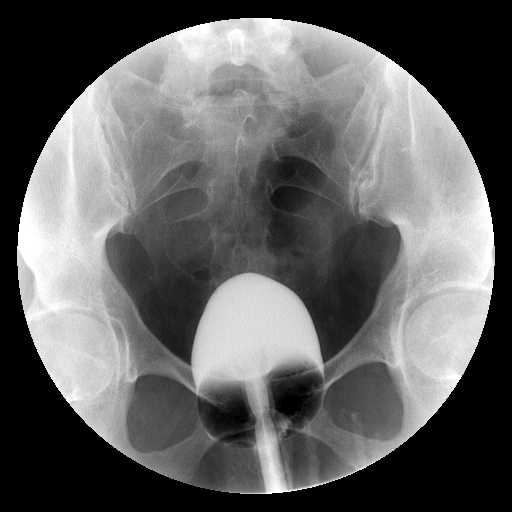

[Series 10: run · 2 of 4 slices shown (8 of 10)]
[im 1/4]
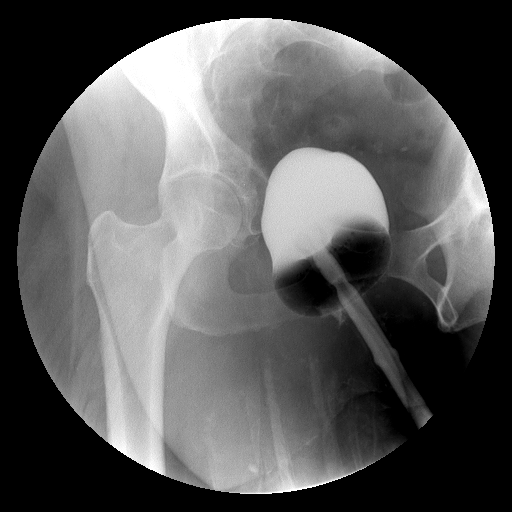
[im 4/4]
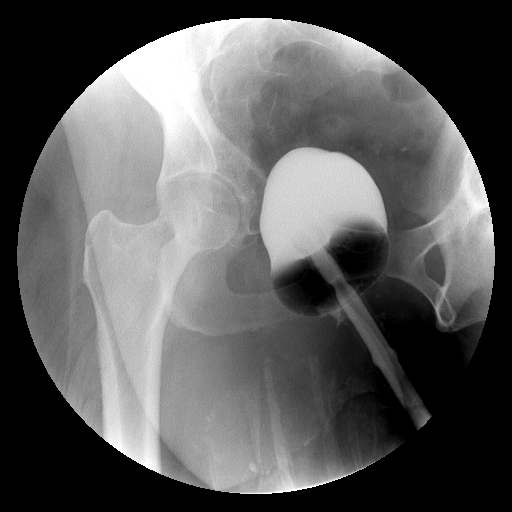

[Series 11: run · 2 of 4 slices shown (9 of 10)]
[im 1/4]
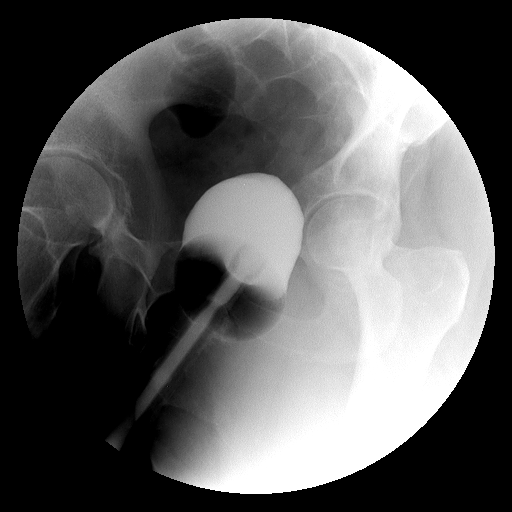
[im 4/4]
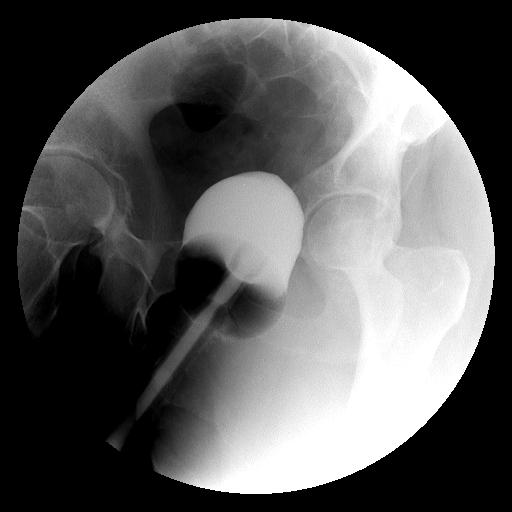

[Series 12: run · 2 of 3 slices shown (10 of 10)]
[im 1/3]
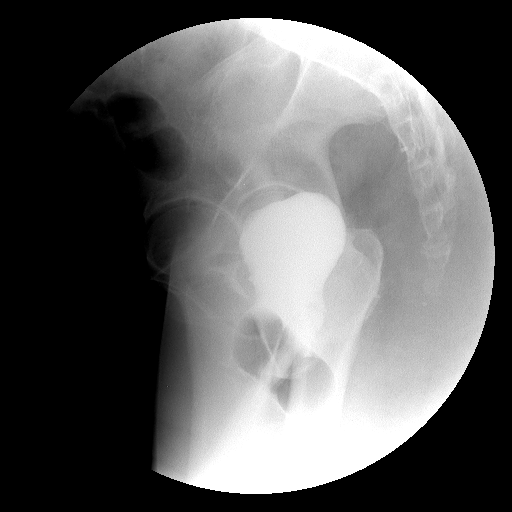
[im 3/3]
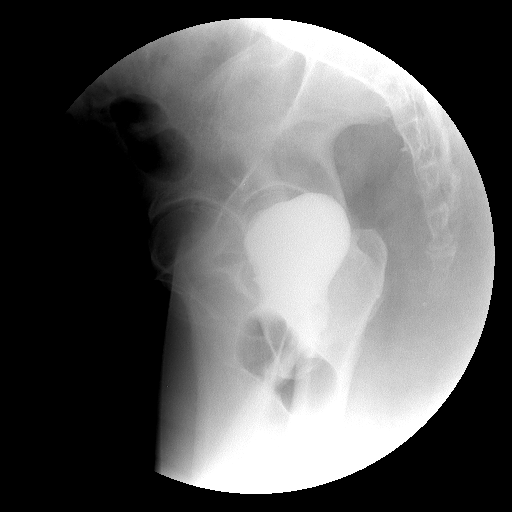

[Series 1002: view not recorded · 0.20mm/px · 1 of 1 slices shown]
[im 1/1]
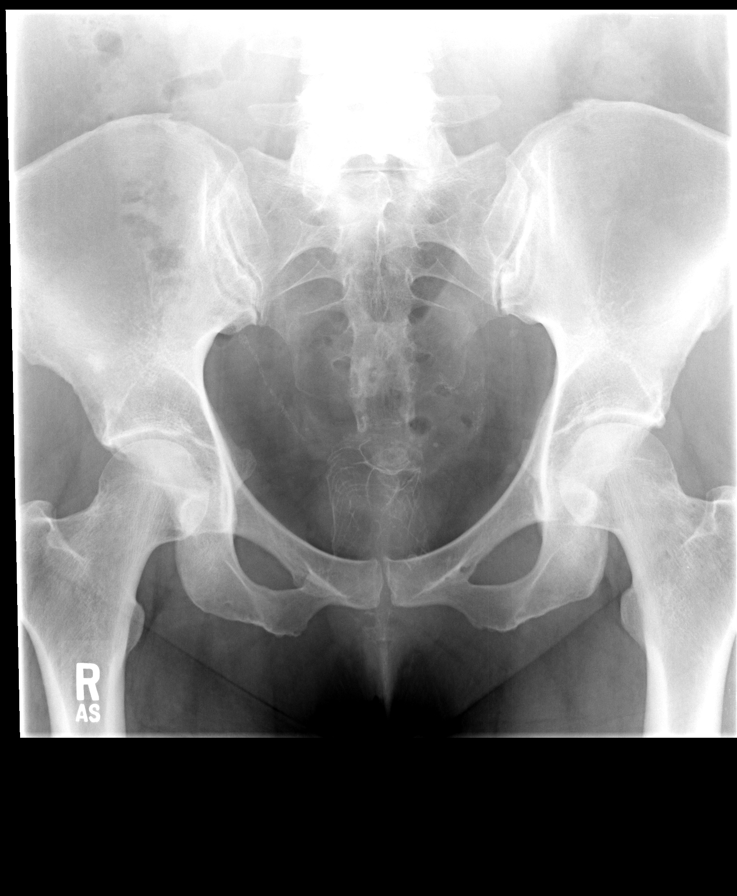

[17 of 24 positions shown; findings below may reference images not displayed]

FINDINGS: Scout view appears normal.

Retrograde adequate filling of the vagina demonstrates no
extravasation, fistulous tract or endocervical/endometrial canal
filling of the uterus.  Post drainage appears normal.
IMPRESSION: Normal vaginogram.  (If symptoms recur, consider
concurrent/immediate vaginogram as possibility of closure fistulous
tract currently.)

## 2012-05-30 ENCOUNTER — Other Ambulatory Visit: Payer: Self-pay | Admitting: Obstetrics & Gynecology

## 2012-05-30 DIAGNOSIS — Z1231 Encounter for screening mammogram for malignant neoplasm of breast: Secondary | ICD-10-CM

## 2012-06-02 ENCOUNTER — Other Ambulatory Visit: Payer: Self-pay | Admitting: Internal Medicine

## 2012-06-28 ENCOUNTER — Ambulatory Visit
Admission: RE | Admit: 2012-06-28 | Discharge: 2012-06-28 | Disposition: A | Discharge status: Home / Self Care | Payer: 59 | Source: Ambulatory Visit | Attending: Obstetrics & Gynecology | Admitting: Obstetrics & Gynecology

## 2012-06-28 DIAGNOSIS — Z1231 Encounter for screening mammogram for malignant neoplasm of breast: Secondary | ICD-10-CM

## 2012-06-30 ENCOUNTER — Encounter (HOSPITAL_COMMUNITY): Payer: Self-pay | Admitting: *Deleted

## 2012-06-30 ENCOUNTER — Emergency Department (HOSPITAL_COMMUNITY): Payer: 59

## 2012-06-30 ENCOUNTER — Emergency Department (HOSPITAL_COMMUNITY)
Admission: EM | Admit: 2012-06-30 | Discharge: 2012-06-30 | Disposition: A | Discharge status: Home / Self Care | Payer: 59 | Attending: Emergency Medicine | Admitting: Emergency Medicine

## 2012-06-30 DIAGNOSIS — F411 Generalized anxiety disorder: Secondary | ICD-10-CM | POA: Insufficient documentation

## 2012-06-30 DIAGNOSIS — F319 Bipolar disorder, unspecified: Secondary | ICD-10-CM | POA: Insufficient documentation

## 2012-06-30 DIAGNOSIS — Z9089 Acquired absence of other organs: Secondary | ICD-10-CM | POA: Insufficient documentation

## 2012-06-30 DIAGNOSIS — Z932 Ileostomy status: Secondary | ICD-10-CM | POA: Insufficient documentation

## 2012-06-30 DIAGNOSIS — R110 Nausea: Secondary | ICD-10-CM | POA: Insufficient documentation

## 2012-06-30 DIAGNOSIS — Z87891 Personal history of nicotine dependence: Secondary | ICD-10-CM | POA: Insufficient documentation

## 2012-06-30 DIAGNOSIS — Z8739 Personal history of other diseases of the musculoskeletal system and connective tissue: Secondary | ICD-10-CM | POA: Insufficient documentation

## 2012-06-30 DIAGNOSIS — Z8639 Personal history of other endocrine, nutritional and metabolic disease: Secondary | ICD-10-CM | POA: Insufficient documentation

## 2012-06-30 DIAGNOSIS — R109 Unspecified abdominal pain: Secondary | ICD-10-CM | POA: Insufficient documentation

## 2012-06-30 DIAGNOSIS — Z8669 Personal history of other diseases of the nervous system and sense organs: Secondary | ICD-10-CM | POA: Insufficient documentation

## 2012-06-30 DIAGNOSIS — E669 Obesity, unspecified: Secondary | ICD-10-CM | POA: Insufficient documentation

## 2012-06-30 DIAGNOSIS — Z90710 Acquired absence of both cervix and uterus: Secondary | ICD-10-CM | POA: Insufficient documentation

## 2012-06-30 DIAGNOSIS — Z79899 Other long term (current) drug therapy: Secondary | ICD-10-CM | POA: Insufficient documentation

## 2012-06-30 DIAGNOSIS — Z87448 Personal history of other diseases of urinary system: Secondary | ICD-10-CM | POA: Insufficient documentation

## 2012-06-30 DIAGNOSIS — K219 Gastro-esophageal reflux disease without esophagitis: Secondary | ICD-10-CM | POA: Insufficient documentation

## 2012-06-30 DIAGNOSIS — K589 Irritable bowel syndrome without diarrhea: Secondary | ICD-10-CM | POA: Insufficient documentation

## 2012-06-30 DIAGNOSIS — Z862 Personal history of diseases of the blood and blood-forming organs and certain disorders involving the immune mechanism: Secondary | ICD-10-CM | POA: Insufficient documentation

## 2012-06-30 DIAGNOSIS — I7300 Raynaud's syndrome without gangrene: Secondary | ICD-10-CM | POA: Insufficient documentation

## 2012-06-30 LAB — CBC WITH DIFFERENTIAL/PLATELET
Basophils Absolute: 0 10*3/uL (ref 0.0–0.1)
Basophils Relative: 1 % (ref 0–1)
Eosinophils Absolute: 0 10*3/uL (ref 0.0–0.7)
Eosinophils Relative: 1 % (ref 0–5)
HCT: 36.3 % (ref 36.0–46.0)
Lymphocytes Relative: 39 % (ref 12–46)
MCH: 28.5 pg (ref 26.0–34.0)
MCHC: 35.8 g/dL (ref 30.0–36.0)
MCV: 79.6 fL (ref 78.0–100.0)
Monocytes Absolute: 0.3 10*3/uL (ref 0.1–1.0)
Platelets: 260 10*3/uL (ref 150–400)
RDW: 13.7 % (ref 11.5–15.5)
WBC: 4.2 10*3/uL (ref 4.0–10.5)

## 2012-06-30 LAB — COMPREHENSIVE METABOLIC PANEL
ALT: 10 U/L (ref 0–35)
AST: 17 U/L (ref 0–37)
Calcium: 8.8 mg/dL (ref 8.4–10.5)
Creatinine, Ser: 0.71 mg/dL (ref 0.50–1.10)
GFR calc non Af Amer: >90 mL/min (ref >90)
Sodium: 136 mEq/L (ref 135–145)
Total Protein: 6.7 g/dL (ref 6.0–8.3)

## 2012-06-30 LAB — URINALYSIS, ROUTINE W REFLEX MICROSCOPIC
Glucose, UA: NEGATIVE mg/dL
Leukocytes, UA: NEGATIVE
Specific Gravity, Urine: 1.007 (ref 1.005–1.030)
pH: 6 (ref 5.0–8.0)

## 2012-06-30 MED ORDER — ONDANSETRON HCL 4 MG/2ML IJ SOLN
4.0000 mg | Freq: Once | INTRAMUSCULAR | Status: AC
  Administered 2012-06-30: 4 mg via INTRAVENOUS
  Filled 2012-06-30: qty 2

## 2012-06-30 MED ORDER — SODIUM CHLORIDE 0.9 % IV BOLUS (SEPSIS)
1000.0000 mL | Freq: Once | INTRAVENOUS | Status: AC
  Administered 2012-06-30: 1000 mL via INTRAVENOUS

## 2012-06-30 MED ORDER — ONDANSETRON HCL 4 MG/2ML IJ SOLN
INTRAMUSCULAR | Status: AC
  Filled 2012-06-30: qty 2

## 2012-06-30 MED ORDER — HYDROMORPHONE HCL PF 1 MG/ML IJ SOLN
1.0000 mg | Freq: Once | INTRAMUSCULAR | Status: AC
  Administered 2012-06-30: 1 mg via INTRAVENOUS
  Filled 2012-06-30: qty 1

## 2012-06-30 MED ORDER — SODIUM CHLORIDE 0.9 % IV SOLN
INTRAVENOUS | Status: DC
  Administered 2012-06-30: 15:00:00 via INTRAVENOUS

## 2012-06-30 MED ORDER — ONDANSETRON HCL 4 MG/2ML IJ SOLN
4.0000 mg | Freq: Once | INTRAMUSCULAR | Status: AC
  Administered 2012-06-30: 4 mg via INTRAVENOUS

## 2012-06-30 MED ORDER — IOHEXOL 300 MG/ML  SOLN
50.0000 mL | Freq: Once | INTRAMUSCULAR | Status: AC | PRN
  Administered 2012-06-30: 50 mL via ORAL

## 2012-06-30 MED ORDER — IOHEXOL 300 MG/ML  SOLN
100.0000 mL | Freq: Once | INTRAMUSCULAR | Status: AC | PRN
  Administered 2012-06-30: 100 mL via INTRAVENOUS

## 2012-06-30 NOTE — ED Notes (Signed)
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

## 2012-06-30 NOTE — ED Notes (Signed)
Patient complaining of pain in right lower flank/back started about 7 days ago. Pain has been coming and going since then. Has been coming more often.

## 2012-06-30 NOTE — ED Provider Notes (Signed)
Sac received on patient from Dr. Freida Busman.  On repeat exam inform her of the CAT scan results.  She remained awake, alert, states that she is now having output into her ileostomy bag.  She continues to complain of nausea, requests medication, but does not want any new pain medication.  She will follow up with her physician in [facility] tomorrow via telephone.  Gerhard Munch, MD 06/30/12 [phone]

## 2012-06-30 NOTE — ED Notes (Signed)
Patient transported to X-ray 

## 2012-06-30 NOTE — ED Provider Notes (Signed)
History     CSN: [id number]  Arrival date & time 06/30/12  1237   First MD Initiated Contact with Patient 06/30/12 1311      Chief Complaint  Patient presents with  . Abdominal Pain    (Consider location/radiation/quality/duration/timing/severity/associated sxs/prior treatment) Patient is a 47 y.o. female presenting with abdominal pain. The history is provided by the patient and the spouse.  Abdominal Pain  patient here with right-sided abdominal and flank pain x1 week and worse today. Nausea without vomiting. Does have an ileostomy as noted decreased output. No fever or chills no diarrhea. Pain is characterized as sharp and constant. She did use Zofran at home with no relief. Denies any dysuria or hematuria. No rashes noted to the skin.  Past Medical History  Diagnosis Date  . Bipolar disorder   . DDD (degenerative disc disease), cervical   . Anxiety   . Depression   . Fibromyalgia   . GERD (gastroesophageal reflux disease)   . IBS (irritable bowel syndrome)   . Raynauds phenomenon   . Gastroparesis   . Colonic inertia   . Proctalgia   . Pelvic floor dysfunction   . Hypoglycemia   . Interstitial cystitis   . Obesity   . Vertigo     Past Surgical History  Procedure Laterality Date  . Cholecystectomy    . Hysterectomy - unknown type    . Total knee arthroplasty  88  & 10    R & L  . Colonoscopy  08/08/2001    normal, Dr. Lina Sar  . Upper gastrointestinal endoscopy  06/15/2006    hiatal hernia, gastritis  . Esophagogastroduodenoscopy  12/20/1993    mild antral gastritis, Dr. Lina Sar  . Edg  06/28/1987    Dr. Matthias Hughs, bile in stomach  . Upper gastrointestinal endoscopy  07/04/2001    Dr. Lina Sar  . Diverting ileostomy  2013    Family History  Problem Relation Age of Onset  . Colon polyps Paternal Uncle   . Esophageal cancer Paternal Uncle   . Diabetes Father   . Heart disease Father   . Stroke Father   . Hyperlipidemia Father   . Hyperlipidemia  Mother     History  Substance Use Topics  . Smoking status: Former Smoker    Quit date: 05/22/1982  . Smokeless tobacco: Never Used  . Alcohol Use: No    OB History   Grav Para Term Preterm Abortions TAB SAB Ect Mult Living                  Review of Systems  Gastrointestinal: Positive for abdominal pain.  All other systems reviewed and are negative.    Allergies  Celecoxib and Reglan  Home Medications   Current Outpatient Rx  Name  Route  Sig  Dispense  Refill  . ACCU-CHEK AVIVA PLUS test strip      as directed.         . calcium-vitamin D (OSCAL) 250-125 MG-UNIT per tablet   Oral   Take 2 tablets by mouth daily.          . clonazePAM (KLONOPIN) 0.5 MG tablet   Oral   Take 0.5 mg by mouth 3 (three) times daily as needed. For anxiety         . dicyclomine (BENTYL) 10 MG/5ML syrup   Oral   Take 10 mg by mouth 4 (four) times daily -  before meals and at bedtime.         Marland Kitchen  Lancets (ACCU-CHEK MULTICLIX) lancets      as directed.         . Multiple Vitamins-Minerals (ADEKS) chewable tablet   Oral   Chew 2 tablets by mouth daily. Multivitamin gummie         . omeprazole (PRILOSEC) 40 MG capsule   Oral   Take 40 mg by mouth daily.         Marland Kitchen omeprazole (PRILOSEC) 40 MG capsule      TAKE ONE CAPSULE BY MOUTH TWICE DAILY   60 capsule   2     Due in April for office visit   . EXPIRED: promethazine (PHENERGAN) 25 MG suppository   Rectal   Place 1 suppository (25 mg total) rectally every 6 (six) hours as needed for nausea.   12 each   0   . Promethazine HCl 6.25 MG/5ML SOLN      2-4 teaspoons 2-3 times a day as needed for nausea   1592 mL   5   . EXPIRED: simethicone (MYLICON) 40 MG/0.6ML drops   Oral   Take 0.6 mLs (40 mg total) by mouth 4 (four) times daily as needed (for abdominal bloating or cramping).   30 mL   0   . Tamsulosin HCl (FLOMAX) 0.4 MG CAPS   Oral   Take 0.4 mg by mouth every morning.          . traMADol  (ULTRAM) 50 MG tablet   Oral   Take 50 mg by mouth every 6 (six) hours as needed. For pain           BP 149/87  Pulse 109  Temp(Src) 97.9 F (36.6 C) (Oral)  Resp 20  SpO2 94%  Physical Exam  Nursing note and vitals reviewed. Constitutional: She is oriented to person, place, and time. She appears well-developed and well-nourished.  Non-toxic appearance. No distress.  HENT:  Head: Normocephalic and atraumatic.  Eyes: Conjunctivae, EOM and lids are normal. Pupils are equal, round, and reactive to light.  Neck: Normal range of motion. Neck supple. No tracheal deviation present. No mass present.  Cardiovascular: Normal rate, regular rhythm and normal heart sounds.  Exam reveals no gallop.   No murmur heard. Pulmonary/Chest: Effort normal and breath sounds normal. No stridor. No respiratory distress. She has no decreased breath sounds. She has no wheezes. She has no rhonchi. She has no rales.  Abdominal: Soft. Normal appearance and bowel sounds are normal. She exhibits no distension. There is no tenderness. There is no rebound and no CVA tenderness.  Ileostomy bag intact. Some yellow-brown stool noted  Musculoskeletal: Normal range of motion. She exhibits no edema and no tenderness.  Neurological: She is alert and oriented to person, place, and time. She has normal strength. No cranial nerve deficit or sensory deficit. GCS eye subscore is 4. GCS verbal subscore is 5. GCS motor subscore is 6.  Skin: Skin is warm and dry. No abrasion and no rash noted.  Psychiatric: She has a normal mood and affect. Her speech is normal and behavior is normal.    ED Course  Procedures (including critical care time)  Labs Reviewed  URINE CULTURE  URINALYSIS, ROUTINE W REFLEX MICROSCOPIC  CBC WITH DIFFERENTIAL  COMPREHENSIVE METABOLIC PANEL  LIPASE, BLOOD   No results found.   No diagnosis found.    MDM  Labs and x-rays are all negative here. She continues to note pain in that order  abdominal CT that will be followed up on by  Dr. Baldwin Jamaica, MD 06/30/12 [phone]

## 2012-07-02 LAB — URINE CULTURE: Colony Count: >=100000

## 2012-08-13 ENCOUNTER — Telehealth: Payer: Self-pay | Admitting: Family Medicine

## 2012-08-13 DIAGNOSIS — F411 Generalized anxiety disorder: Secondary | ICD-10-CM

## 2012-08-13 MED ORDER — CLONAZEPAM 0.5 MG PO TABS: 0.5000 mg | ORAL_TABLET | Freq: Every day | ORAL | Status: DC | PRN

## 2012-08-13 NOTE — Telephone Encounter (Signed)
Ok to call out 30

## 2012-08-13 NOTE — Telephone Encounter (Signed)
Medication refilled per protocol. 

## 2012-08-13 NOTE — Telephone Encounter (Signed)
Ok to refill 

## 2012-08-13 NOTE — Telephone Encounter (Signed)
Ok to call out

## 2012-08-13 NOTE — Telephone Encounter (Signed)
Last OV 07/11/2012 for CPE.  Last refill this med 07/03/2012 #30. Clonazepam 0.5mg  QD PRN Need approval for controlled medication.

## 2012-08-19 ENCOUNTER — Telehealth: Payer: Self-pay | Admitting: Family Medicine

## 2012-08-19 NOTE — Telephone Encounter (Signed)
I am fine with that but I can't make an appointment without knowing why?  Is GI scheduling?Marland Kitchen  Can we get GI's office note to review because neuro will ask why.

## 2012-08-20 NOTE — Telephone Encounter (Signed)
appt made for pt

## 2012-08-22 ENCOUNTER — Telehealth: Payer: Self-pay | Admitting: Family Medicine

## 2012-08-23 NOTE — Telephone Encounter (Signed)
Pt needs to be seen for an evaluation of new problem before referral can be completed. Pt ware.

## 2012-08-30 ENCOUNTER — Ambulatory Visit (INDEPENDENT_AMBULATORY_CARE_PROVIDER_SITE_OTHER): Payer: 59 | Admitting: Family Medicine

## 2012-08-30 ENCOUNTER — Encounter: Payer: Self-pay | Admitting: Family Medicine

## 2012-08-30 VITALS — BP 110/68 | HR 72 | Temp 98.3°F | Resp 18 | Wt 165.0 lb

## 2012-08-30 DIAGNOSIS — G609 Hereditary and idiopathic neuropathy, unspecified: Secondary | ICD-10-CM

## 2012-08-30 DIAGNOSIS — G589 Mononeuropathy, unspecified: Secondary | ICD-10-CM

## 2012-08-30 DIAGNOSIS — G629 Polyneuropathy, unspecified: Secondary | ICD-10-CM

## 2012-08-30 NOTE — Progress Notes (Signed)
Subjective:    Patient ID: Judy Gibbs, female    DOB: [birth year], 47 y.o.   MRN: [id number]  HPI  Patient presents requesting referral to [facility] for neurology.  She is had several episodes of burning dysesthesias in her dorsal and volar left forearm as well as her lateral left leg.  She denies any neck pain or back pain. She denies any muscle weakness in the affected extremity. Sensation is going come without a causing phenomenon.  She states that she feels extremely weak and tired when the spells happen.  It can last a day at time.  She also reports numbness and paresthesias in the same distribution.  She denies any vision changes, trouble swallowing, or muscle weakness.  There are no tremors. Past Medical History  Diagnosis Date  . Bipolar disorder   . DDD (degenerative disc disease), cervical   . Anxiety   . Depression   . Fibromyalgia   . GERD (gastroesophageal reflux disease)   . IBS (irritable bowel syndrome)   . Raynauds phenomenon   . Gastroparesis   . Colonic inertia   . Proctalgia   . Pelvic floor dysfunction   . Hypoglycemia   . Interstitial cystitis   . Obesity   . Vertigo    Current Outpatient Prescriptions on File Prior to Visit  Medication Sig Dispense Refill  . calcium-vitamin D (OSCAL) 250-125 MG-UNIT per tablet Take 2 tablets by mouth every morning.       . cholestyramine (QUESTRAN) 4 G packet Take 1 packet by mouth 4 (four) times daily.      . clonazePAM (KLONOPIN) 0.5 MG tablet Take 1 tablet (0.5 mg total) by mouth daily as needed. For anxiety  30 tablet  0  . doxazosin (CARDURA) 2 MG tablet Take 2 mg by mouth every morning.      . dronabinol (MARINOL) 2.5 MG capsule Take 2.5 mg by mouth 2 (two) times daily as needed (nausea).      . isosorbide dinitrate (ISORDIL) 5 MG tablet Take 5 mg by mouth 3 (three) times daily as needed.      Marland Kitchen omeprazole (PRILOSEC) 40 MG capsule Take 40 mg by mouth 2 (two) times daily.       . promethazine (PHENERGAN) 6.25 MG/5ML  syrup Take 20 mLs by mouth 4 (four) times daily as needed for nausea.      . ranitidine (ZANTAC) 150 MG tablet Take 150 mg by mouth 2 (two) times daily.      . Multiple Vitamins-Minerals (ADEKS) chewable tablet Chew 2 tablets by mouth every morning. Multivitamin gummie       No current facility-administered medications on file prior to visit.   History   Social History  . Marital Status: Married    Spouse Name: N/A    Number of Children: 2  . Years of Education: N/A   Occupational History  . homemaker    Social History Main Topics  . Smoking status: Former Smoker    Quit date: 05/22/1982  . Smokeless tobacco: Never Used  . Alcohol Use: No  . Drug Use: No  . Sexually Active: Not on file   Other Topics Concern  . Not on file   Social History Narrative  . No narrative on file     Review of Systems  All other systems reviewed and are negative.       Objective:   Physical Exam  Constitutional: She is oriented to person, place, and time. She appears well-developed and well-nourished.  HENT:  Head: Normocephalic.  Right Ear: External ear normal.  Left Ear: External ear normal.  Eyes: Conjunctivae are normal. Pupils are equal, round, and reactive to light.  Cardiovascular: Normal rate, regular rhythm, normal heart sounds and intact distal pulses.  Exam reveals no friction rub.   No murmur heard. Pulmonary/Chest: Effort normal and breath sounds normal. No respiratory distress. She has no wheezes. She has no rales.  Abdominal: Soft. Bowel sounds are normal. She exhibits no distension and no mass. There is no tenderness. There is no rebound and no guarding.  Neurological: She is alert and oriented to person, place, and time. She has normal reflexes. She displays normal reflexes. No cranial nerve deficit. She exhibits normal muscle tone. Coordination normal.  Skin: Skin is warm and dry. No rash noted. No erythema. No pallor.          Assessment & Plan:  1. Peripheral  neuropathy She had recently had a physical were her blood sugar as well as her TSH were normal. Will check an SPEP and a U pep to rule out paraneoplastic syndromes and amyloidosis.  We'll check a sedimentation rate to rule out possible vasculitis. Given her history of severe gastroparesis I will check a zinc and copper level to rule out deficiencies in these minerals.. she also likely need an MRI of the C-spine, brain, L-spine to rule out multiple sclerosis.  However she like to see a neurologist at [facility] and I will defer ordering these tests as they can get them there and be able to review them in their office. - Protein electrophoresis, serum - Protein Electrophoresis, Urine Rflx. - Sedimentation rate - Copper, Serum - Zinc  2. Peripheral axonal neuropathy - Protein electrophoresis, serum - Protein Electrophoresis, Urine Rflx. - Sedimentation rate - Copper, Serum - Zinc

## 2012-09-02 LAB — ZINC: Zinc: 92 ug/dL (ref 60–130)

## 2012-09-02 LAB — COPPER, SERUM: Copper: 95 ug/dL (ref 70–175)

## 2012-09-03 ENCOUNTER — Other Ambulatory Visit (INDEPENDENT_AMBULATORY_CARE_PROVIDER_SITE_OTHER): Payer: 59

## 2012-09-03 DIAGNOSIS — Z0000 Encounter for general adult medical examination without abnormal findings: Secondary | ICD-10-CM

## 2012-09-03 LAB — VITAMIN B12: Vitamin B-12: 742 pg/mL (ref 211–911)

## 2012-09-03 LAB — PROTEIN ELECTROPHORESIS, SERUM
Alpha-1-Globulin: 4.1 % (ref 2.9–4.9)
Alpha-2-Globulin: 9.4 % (ref 7.1–11.8)
Beta Globulin: 6.3 % (ref 4.7–7.2)
Total Protein, Serum Electrophoresis: 6.3 g/dL (ref 6.0–8.3)

## 2012-09-04 LAB — PROTEIN ELECTROPHORESIS, URINE REFLEX
Total Protein, Urine/Day: 28 mg/d — ABNORMAL LOW (ref 50–100)
Total Protein, Urine: 4 mg/dL

## 2012-09-23 ENCOUNTER — Telehealth: Payer: Self-pay | Admitting: Family Medicine

## 2012-09-23 ENCOUNTER — Encounter: Payer: Self-pay | Admitting: Family Medicine

## 2012-09-23 DIAGNOSIS — F411 Generalized anxiety disorder: Secondary | ICD-10-CM

## 2012-09-23 DIAGNOSIS — F319 Bipolar disorder, unspecified: Secondary | ICD-10-CM

## 2012-09-23 MED ORDER — CLONAZEPAM 0.5 MG PO TABS: 0.5000 mg | ORAL_TABLET | Freq: Every day | ORAL | Status: DC | PRN

## 2012-09-23 MED ORDER — PREDNISONE 20 MG PO TABS: 20.0000 mg | ORAL_TABLET | Freq: Every day | ORAL | Status: DC

## 2012-09-23 NOTE — Telephone Encounter (Signed)
?   OK to Refill  

## 2012-09-23 NOTE — Telephone Encounter (Signed)
Ok to refill 

## 2012-09-23 NOTE — Telephone Encounter (Signed)
If allergic reaction, would not use abx, needs to be seen and prednisone dose pack asap.

## 2012-09-23 NOTE — Telephone Encounter (Signed)
Pt is having an (?) allergic rxn to lipstick or plastic straw or suckers...lips are red and sore on the inside where teeth touch. No problems breathing, CP or tongue swelling. Pt's husband aware to get Pred. And continue taking the benadryl until appt Wednesday and if she does develop probs. Breathing or CP to seek medical attention ASAP.

## 2012-09-23 NOTE — Telephone Encounter (Signed)
Rx Refilled  

## 2012-09-23 NOTE — Telephone Encounter (Signed)
Wants to know if she can have an antibotic until she comes in on wednes for allergic reaction. Her lips feel like they are on fire and swollen.Pt states can tolerate until she can be seen but wants something to help the burning so she can sleep. Pleae advise

## 2012-09-25 ENCOUNTER — Encounter: Payer: Self-pay | Admitting: Physician Assistant

## 2012-09-25 ENCOUNTER — Ambulatory Visit (INDEPENDENT_AMBULATORY_CARE_PROVIDER_SITE_OTHER): Payer: 59 | Admitting: Physician Assistant

## 2012-09-25 VITALS — BP 110/70 | HR 80 | Temp 97.0°F | Resp 16

## 2012-09-25 DIAGNOSIS — L239 Allergic contact dermatitis, unspecified cause: Secondary | ICD-10-CM

## 2012-09-25 DIAGNOSIS — L259 Unspecified contact dermatitis, unspecified cause: Secondary | ICD-10-CM

## 2012-09-25 MED ORDER — EPINEPHRINE 0.3 MG/0.3ML IJ SOAJ: 0.3000 mg | Freq: Once | INTRAMUSCULAR | Status: DC

## 2012-09-25 MED ORDER — PREDNISONE 20 MG PO TABS: ORAL_TABLET | ORAL | Status: DC

## 2012-09-25 NOTE — Progress Notes (Signed)
Patient ID: TIFANI DACK MRN: [id number], DOB: 02-14-1966, 47 y.o. Date of Encounter: 09/25/2012, 9:55 AM    Chief Complaint:  Chief Complaint  Patient presents with  . other    allergic reaction?     HPI: 47 y.o. year old female here for evaluation of 'allergic reaction."   Reports that a few months ago after applying lipstick, lips "burned.' Also states that recently when had a "blowpop" lollipop, lips swelled, inside of lip felt raw, and throat tightened. She used benadryl but it took two days for the lip swelling to resolve.  This past Saturday-4 days ago-had two butterscotch candies-then lips burned.  This past Sunday-3 days ago-drank some water with flavoring added to it-was using a rubber straw to drink it-lips burned again. Also, inside of lower lip felt raw. She used Benadryl. She called here Monday and Prednisone dose pack was called in. She is taking this as directed. (3 x2days, 2x2days, 1x2days). Yesterday ate Neale Burly cracker-lips burned. Later yesterday ate some peanut butter and lips burned. Pt concerned. Leaving for beach on Monday-will only be gone 3 days.  No other swelling, throat tighness other than that mentioned above. No SOB, wheezing. No other areas of itching or rash.     Home Meds: See attached medication section for any medications that were entered at today's visit. The computer does not put those onto this list.The following list is a list of meds entered prior to today's visit.   Current Outpatient Prescriptions on File Prior to Visit  Medication Sig Dispense Refill  . calcium-vitamin D (OSCAL) 250-125 MG-UNIT per tablet Take 2 tablets by mouth every morning.       . cholestyramine (QUESTRAN) 4 G packet Take 1 packet by mouth 4 (four) times daily.      . clonazePAM (KLONOPIN) 0.5 MG tablet Take 1 tablet (0.5 mg total) by mouth daily as needed. For anxiety  30 tablet  0  . doxazosin (CARDURA) 2 MG tablet Take 2 mg by mouth every morning.      . dronabinol  (MARINOL) 2.5 MG capsule Take 2.5 mg by mouth 2 (two) times daily as needed (nausea).      . isosorbide dinitrate (ISORDIL) 5 MG tablet Take 5 mg by mouth 3 (three) times daily as needed.      . Multiple Vitamins-Minerals (ADEKS) chewable tablet Chew 2 tablets by mouth every morning. Multivitamin gummie      . omeprazole (PRILOSEC) 40 MG capsule Take 40 mg by mouth 2 (two) times daily.       . predniSONE (DELTASONE) 20 MG tablet Take 1 tablet (20 mg total) by mouth daily. 3 tabs po qd x 2 days, 2 tabs po qd x 2 days, 1 tab po qd x 2 days  12 tablet  0  . promethazine (PHENERGAN) 6.25 MG/5ML syrup Take 20 mLs by mouth 4 (four) times daily as needed for nausea.      . ranitidine (ZANTAC) 150 MG tablet Take 150 mg by mouth 2 (two) times daily.      . tapentadol (NUCYNTA) 50 MG TABS Take 50 mg by mouth.       No current facility-administered medications on file prior to visit.    Allergies:  Allergies  Allergen Reactions  . Celecoxib     REACTION: hives  . Reglan (Metoclopramide) Anxiety      Review of Systems: See HPI for pertinent ROS. All other ROS negative.    Physical Exam: Blood pressure 110/70, pulse  80, temperature 97 F (36.1 C), temperature source Oral, resp. rate 16., There is no weight on file to calculate BMI. General: WF.   Appears in no acute distress. HEENT: lower lip appears dry with very minimal cracking. Inner area of lower lip appears minimally erythematous. No lesions or swelling now. Oral mucosa normal with no erythema or lesions. Neck: Supple. No thyromegaly. No swelling of neck. Lungs: Clear bilaterally to auscultation without wheezes, rales, or rhonchi. Breathing is unlabored. Heart: Regular rhythm. No murmurs, rubs, or gallops. Msk:  Strength and tone normal for age. Extremities/Skin: Warm and dry. No clubbing or cyanosis. No edema. No rashes or suspicious lesions. Neuro: Alert and oriented X 3. Moves all extremities spontaneously. Gait is normal. CNII-XII  grossly in tact. Psych:  Responds to questions appropriately with a normal affect.     ASSESSMENT AND PLAN:  47 y.o. year old female with  1. Allergic dermatitis  Currently, allergic reaction is resolving. Told her to avoid food,candy, drinks with coloring. Also would avoid peanut butter and nuts since in general, these are commonly allergens to people. Will refer her to allergist for allergy testing.  She is concerned about having reaction while out of town at [employer].  Told her to take Benadryl with her and use this prn. I will give her one more prescription of perdnisone taper to have available in case it is needed. Only use this if synmptoms not controlled with benadryl. Also will give EpiPen to have available in case needed-only use this if develops tightness in throat, difficulty breathing.  - predniSONE (DELTASONE) 20 MG tablet; Take 3 daily for 2 days, then 2 daily for 2 days, then 1 daily for 2 days.  Dispense: 12 tablet; Refill: 0 - EPINEPHrine (EPI-PEN) 0.3 mg/0.3 mL DEVI; Inject 0.3 mLs (0.3 mg total) into the muscle once.  Dispense: 2 Device; Refill: 1 - Ambulatory referral to Allergy   Signed, Frazier Richards, [place], [facility] 09/25/2012 9:55 AM

## 2012-10-04 ENCOUNTER — Telehealth: Payer: Self-pay | Admitting: *Deleted

## 2012-10-04 NOTE — Telephone Encounter (Signed)
Had a question about a referral to allergist. Told pt's husband that would get it taken care of ASAP. And if allergic rxn occurred again to please go to ER if problems breathing, tongue swelling etc. He agreed and they do have an Epipen on hand just in case of such a rxn.

## 2012-11-01 ENCOUNTER — Other Ambulatory Visit: Payer: Self-pay | Admitting: Internal Medicine

## 2012-11-11 ENCOUNTER — Telehealth: Payer: Self-pay | Admitting: Family Medicine

## 2012-11-12 NOTE — Telephone Encounter (Signed)
Per Dr. Tanya Nones ntbs.Marland Kitchenappointment made

## 2012-11-14 ENCOUNTER — Telehealth: Payer: Self-pay | Admitting: Family Medicine

## 2012-11-15 MED ORDER — ALPRAZOLAM 0.5 MG PO TABS: 0.5000 mg | ORAL_TABLET | Freq: Three times a day (TID) | ORAL | Status: DC

## 2012-11-15 NOTE — Telephone Encounter (Signed)
Mr. Colley called states that pt had botox done at [facility] and it helped pain for about 3 days and now it is back and she is hurting really bad. She took a xanax 1mg  along with her pain pill and that seemed to help a lot with the anxiety of the pain coming on and would like to know if you would send her in an rx for it?

## 2012-11-15 NOTE — Telephone Encounter (Signed)
Patient aware and med c/o

## 2012-11-15 NOTE — Telephone Encounter (Signed)
She can use xanax 0.5 mg po q8 hrs prn (30) but she CANNOT use this with clonazepam.  She will have to stop clonazepam. While she is using xanax

## 2012-11-26 ENCOUNTER — Ambulatory Visit: Payer: 59 | Admitting: Family Medicine

## 2012-12-30 ENCOUNTER — Telehealth: Payer: Self-pay | Admitting: Internal Medicine

## 2012-12-30 NOTE — Telephone Encounter (Signed)
Spoke with pt, she was recently seen in [facility] for digestive problems. She is looking over the records from [facility] and they say she has had heart damage that she is not aware of. She has been having some esophageal spasms and chest pain. She is concerned because of this that the pain is heart related. She wants a sooner appt to see dr Graciela Husbands. Follow up scheduled

## 2012-12-30 NOTE — Telephone Encounter (Signed)
New prob  Pt states she was seen in [place] recently and was looking over her chart. She would like to speak with you regarding something she saw on the record.

## 2013-01-08 ENCOUNTER — Telehealth: Payer: Self-pay | Admitting: Family Medicine

## 2013-01-08 NOTE — Telephone Encounter (Signed)
Pts glucose monitor broke. Can we send in a new one to:  M[facility] [phone] Account number: [id number]  She uses this place to get all of her medical supplies and she said that we do not have to put what kind of monitor because when we fax it in they will call her and she can tell them what kind she wants and get the supplies to go with it.

## 2013-01-09 MED ORDER — BLOOD GLUCOSE MONITORING SUPPL SUPPLIES MISC: Status: DC

## 2013-01-09 NOTE — Telephone Encounter (Signed)
Sent glucose supplies and new monitor to medical supply store as requested by pt

## 2013-01-14 ENCOUNTER — Telehealth: Payer: Self-pay | Admitting: Internal Medicine

## 2013-01-14 NOTE — Telephone Encounter (Signed)
New Prob  Pt states she was in the hospital at [facility] at the end of June. She would like to speak with the nurse regarding some of the information she has on the records.

## 2013-01-15 NOTE — Telephone Encounter (Signed)
LMTC

## 2013-01-27 ENCOUNTER — Ambulatory Visit: Payer: 59 | Admitting: Internal Medicine

## 2013-01-31 NOTE — Telephone Encounter (Signed)
Spoke with patient who states that she will bring information with her at her next OV with Graciela Husbands next week

## 2013-02-04 ENCOUNTER — Ambulatory Visit (INDEPENDENT_AMBULATORY_CARE_PROVIDER_SITE_OTHER): Payer: 59 | Admitting: Internal Medicine

## 2013-02-04 VITALS — BP 113/79 | HR 99

## 2013-02-04 DIAGNOSIS — R9431 Abnormal electrocardiogram [ECG] [EKG]: Secondary | ICD-10-CM

## 2013-02-04 DIAGNOSIS — R079 Chest pain, unspecified: Secondary | ICD-10-CM

## 2013-02-04 DIAGNOSIS — R002 Palpitations: Secondary | ICD-10-CM

## 2013-02-04 NOTE — Assessment & Plan Note (Signed)
She has recurrent chest pain with an abnormal ECG. We'll undertake Myoview scanning. She is not very ambulatory so I don't think stress testing would be helpful except to demonstrate exertional tachycardia

## 2013-02-04 NOTE — Assessment & Plan Note (Signed)
The patient has recurrent palpitations including with exertion and showers. 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

## 2013-02-04 NOTE — Progress Notes (Signed)
Patient Care Team: Donita Brooks, MD as PCP - General (Family Medicine)   HPI  CAMEO SCHMIESING is a 47 year old woman seen after a hiatus of a number of years. She has been undergoing extensive evaluation of symptoms in her GI tract associated with hypomotility sufficiently severe to prompting need for ileostomy, reflux, esophageal spasms as well as numbness and gait instability. She seen at [facility] a couple of years ago and the autonomic clinic was felt not to have a dysautonomia.  She is here now because of palpitations. They're associated with exertion as well as going to sleep. She has modest exercise intolerance related tachypalpitations. His recurrent chest discomfort. EMS was called on one occasion. ECG at that time demonstrated right axis deviation (see below) and nonspecific ST changes. An ECG done 02/01/11 is distinct from 5/12 making me wonder that whether these are both living later   Past Medical History  Diagnosis Date  . Bipolar disorder   . DDD (degenerative disc disease), cervical   . Anxiety   . Depression   . Fibromyalgia   . GERD (gastroesophageal reflux disease)   . IBS (irritable bowel syndrome)   . Raynauds phenomenon   . Gastroparesis   . Colonic inertia   . Proctalgia   . Pelvic floor dysfunction   . Hypoglycemia   . Interstitial cystitis   . Obesity   . Vertigo     Past Surgical History  Procedure Laterality Date  . Cholecystectomy    . Hysterectomy - unknown type    . Total knee arthroplasty  88  & 10    R & L  . Colonoscopy  08/08/2001    normal, Dr. Lina Sar  . Upper gastrointestinal endoscopy  06/15/2006    hiatal hernia, gastritis  . Esophagogastroduodenoscopy  12/20/1993    mild antral gastritis, Dr. Lina Sar  . Edg  06/28/1987    Dr. Matthias Hughs, bile in stomach  . Upper gastrointestinal endoscopy  07/04/2001    Dr. Lina Sar  . Diverting ileostomy  2013  . Abdominal hysterectomy  2001    has one ovary    Current Outpatient  Prescriptions  Medication Sig Dispense Refill  . ALPRAZolam (XANAX) 0.5 MG tablet Take 1 tablet (0.5 mg total) by mouth every 8 (eight) hours.  30 tablet  0  . Blood Glucose Monitoring Suppl SUPPLIES MISC Check bs qam  1 each  0  . calcium-vitamin D (OSCAL) 250-125 MG-UNIT per tablet Take 2 tablets by mouth every morning.       . cholestyramine (QUESTRAN) 4 G packet Take 1 packet by mouth 4 (four) times daily.      . clonazePAM (KLONOPIN) 0.5 MG tablet Take 1 tablet (0.5 mg total) by mouth daily as needed. For anxiety  30 tablet  0  . doxazosin (CARDURA) 2 MG tablet Take 2 mg by mouth every morning.      . dronabinol (MARINOL) 2.5 MG capsule Take 2.5 mg by mouth 2 (two) times daily as needed (nausea).      . EPINEPHrine (EPI-PEN) 0.3 mg/0.3 mL DEVI Inject 0.3 mLs (0.3 mg total) into the muscle once.  2 Device  1  . isosorbide dinitrate (ISORDIL) 5 MG tablet Take 5 mg by mouth 3 (three) times daily as needed.      . Multiple Vitamins-Minerals (ADEKS) chewable tablet Chew 2 tablets by mouth every morning. Multivitamin gummie      . omeprazole (PRILOSEC) 40 MG capsule Take 40 mg by mouth 2 (  two) times daily.       Marland Kitchen omeprazole (PRILOSEC) 40 MG capsule TAKE 1 CAPSULE BY MOUTH TWICE DAILY  60 capsule  0  . ondansetron (ZOFRAN-ODT) 8 MG disintegrating tablet Take 8 mg by mouth every 8 (eight) hours as needed for nausea.      . predniSONE (DELTASONE) 20 MG tablet Take 1 tablet (20 mg total) by mouth daily. 3 tabs po qd x 2 days, 2 tabs po qd x 2 days, 1 tab po qd x 2 days  12 tablet  0  . predniSONE (DELTASONE) 20 MG tablet Take 3 daily for 2 days, then 2 daily for 2 days, then 1 daily for 2 days.  12 tablet  0  . promethazine (PHENERGAN) 6.25 MG/5ML syrup Take 20 mLs by mouth 4 (four) times daily as needed for nausea.      . ranitidine (ZANTAC) 150 MG tablet Take 150 mg by mouth 2 (two) times daily.      . tapentadol (NUCYNTA) 50 MG TABS Take 50 mg by mouth.       No current facility-administered  medications for this visit.    Allergies  Allergen Reactions  . Celecoxib     REACTION: hives  . Reglan [Metoclopramide] Anxiety    Review of Systems negative except from HPI and PMH  Physical Exam BP 113/79  Pulse 99 Well developed and well nourished in no acute distress HENT normal E scleral and icterus clear Neck Supple JVP flat; carotids brisk and full Clear to ausculation   Regular rate and rhythm, no murmurs gallops or rub Soft with active bowel sounds No clubbing cyanosis none Edema Alert and oriented, grossly normal motor and sensory function Skin Warm and Dry    Assessment and  Plan

## 2013-02-04 NOTE — Patient Instructions (Addendum)
Your physician has recommended that you wear an event monitor. Event monitors are medical devices that record the heart's electrical activity. Doctors most often [place] these monitors to diagnose arrhythmias. Arrhythmias are problems with the speed or rhythm of the heartbeat. The monitor is a small, portable device. You can wear one while you do your normal daily activities. This is usually used to diagnose what is causing palpitations/syncope (passing out).  Your physician has requested that you have a lexiscan myoview. For further information please visit [email or web address] Please follow instruction sheet, as given.  Your physician recommends that you schedule a follow-up appointment in: 4-6 weeks with Dr. Graciela Husbands  Your physician recommends that you continue on your current medications as directed. Please refer to the Current Medication list given to you today.

## 2013-02-06 ENCOUNTER — Encounter: Payer: Self-pay | Admitting: Internal Medicine

## 2013-02-06 ENCOUNTER — Ambulatory Visit (INDEPENDENT_AMBULATORY_CARE_PROVIDER_SITE_OTHER): Payer: 59 | Admitting: Internal Medicine

## 2013-02-06 VITALS — BP 126/84 | HR 92 | Temp 97.4°F | Ht 66.0 in | Wt 175.8 lb

## 2013-02-06 DIAGNOSIS — K599 Functional intestinal disorder, unspecified: Secondary | ICD-10-CM

## 2013-02-06 DIAGNOSIS — F329 Major depressive disorder, single episode, unspecified: Secondary | ICD-10-CM

## 2013-02-06 DIAGNOSIS — F319 Bipolar disorder, unspecified: Secondary | ICD-10-CM

## 2013-02-06 DIAGNOSIS — R079 Chest pain, unspecified: Secondary | ICD-10-CM

## 2013-02-06 DIAGNOSIS — Z23 Encounter for immunization: Secondary | ICD-10-CM

## 2013-02-06 DIAGNOSIS — F341 Dysthymic disorder: Secondary | ICD-10-CM

## 2013-02-06 DIAGNOSIS — G901 Familial dysautonomia [Riley-Day]: Secondary | ICD-10-CM

## 2013-02-06 DIAGNOSIS — K219 Gastro-esophageal reflux disease without esophagitis: Secondary | ICD-10-CM

## 2013-02-06 DIAGNOSIS — G909 Disorder of the autonomic nervous system, unspecified: Secondary | ICD-10-CM

## 2013-02-06 MED ORDER — ALPRAZOLAM 1 MG PO TABS: 1.0000 mg | ORAL_TABLET | Freq: Two times a day (BID) | ORAL | Status: DC

## 2013-02-06 NOTE — Assessment & Plan Note (Signed)
Currently undergoing evaluation by dr Graciela Husbands

## 2013-02-06 NOTE — Assessment & Plan Note (Signed)
Has an appt with Neurologist at [facility] for further evaluation

## 2013-02-06 NOTE — Assessment & Plan Note (Signed)
Stable on current meds.  Continue. 

## 2013-02-06 NOTE — Assessment & Plan Note (Signed)
S/p ileostomy Continues to have chronic abdominal pain About to get another celiac wall block Sees Dr Glendell Docker at [facility]

## 2013-02-06 NOTE — Assessment & Plan Note (Signed)
Pt denies

## 2013-02-06 NOTE — Progress Notes (Signed)
HPI  Pt presents to the clinic today to establish care. She is transferring care from [facility]. She does have some things she wants to discuss today. She is a very sick individual. She has IBS, colonic inertia s/p ileostomy for which she is being followed by Dr Glendell Docker at [facility]. She has also recently starting having recurrent chest pain and palpitations. She is seeing Dr. Marikay Alar for this. She will soon be wearing a portable monitor x 30 days. Per pt, she has had 2 abnormal EKG's but a normal stress test. She has also started having neuropathic pain in her arms and legs. She feels weak and like her body is giving out. 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 Her main concerns today is anxiety. Her health condition has made her very anxious. She feels the anxiety is contributing to her fatigue and weakness. She is on xanax 0.5 mg but with recent life stressors and worsening health, she feels like it is not enough.  Flu: yearly Tetanus: unsure of date LMP: Hysterectomy Pap Smear: total hysterectomy Mammogram: yearly Colonoscopy: 2013 Eye Doctor: yearly Dentist: yearly  Past Medical History  Diagnosis Date  . Bipolar disorder   . DDD (degenerative disc disease), cervical   . Anxiety   . Depression   . Fibromyalgia   . GERD (gastroesophageal reflux disease)   . IBS (irritable bowel syndrome)   . Raynauds phenomenon   . Gastroparesis   . Colonic inertia   . Proctalgia   . Pelvic floor dysfunction   . Hypoglycemia   . Interstitial cystitis   . Obesity   . Vertigo     Current Outpatient Prescriptions  Medication Sig Dispense Refill  . Blood Glucose Monitoring Suppl SUPPLIES MISC Check bs qam  1 each  0  . calcium-vitamin D (OSCAL) 250-125 MG-UNIT per tablet Take 2 tablets by mouth every morning.       . cholestyramine (QUESTRAN) 4 G packet Take 1 packet by mouth 4 (four) times daily.       Marland Kitchen dronabinol (MARINOL) 2.5 MG capsule Take 2.5 mg by mouth 2 (two) times daily as needed (nausea).      . EPINEPHrine (EPI-PEN) 0.3 mg/0.3 mL DEVI Inject 0.3 mLs (0.3 mg total) into the muscle once.  2 Device  1  . Ibuprofen (ADVIL MIGRAINE PO) Take by mouth as needed.      . Multiple Vitamins-Minerals (ADEKS) chewable tablet Chew 2 tablets by mouth every morning. Multivitamin gummie      . omeprazole (PRILOSEC) 40 MG capsule TAKE 1 CAPSULE BY MOUTH TWICE DAILY  60 capsule  0  . ondansetron (ZOFRAN-ODT) 8 MG disintegrating tablet Take 8 mg by mouth every 8 (eight) hours as needed for nausea.      . promethazine (PHENERGAN) 6.25 MG/5ML syrup Take 20 mLs by mouth 4 (four) times daily as needed for nausea.      . tapentadol (NUCYNTA) 50 MG TABS Take 50 mg by mouth.      . traZODone (DESYREL) 100 MG tablet Take 100 mg by mouth at bedtime.        No current facility-administered medications for this visit.    Allergies  Allergen Reactions  . Celecoxib     REACTION: hives  . Reglan [Metoclopramide] Anxiety    Family History  Problem Relation Age of Onset  . Colon polyps Paternal Uncle   . Esophageal cancer Paternal Uncle   .  Diabetes Father   . Heart disease Father   . Stroke Father   . Hyperlipidemia Father   . Hyperlipidemia Mother     History   Social History  . Marital Status: Married    Spouse Name: N/A    Number of Children: 2  . Years of Education: N/A   Occupational History  . homemaker    Social History Main Topics  . Smoking status: Former Smoker    Quit date: 05/22/1982  . Smokeless tobacco: Never Used  . Alcohol Use: No  . Drug Use: No  . Sexual Activity: Not on file   Other Topics Concern  . Not on file   Social History Narrative  . No narrative on file    ROS:  Constitutional: Pt reports fatigue and weight loss. Denies fever, malaise.  HEENT: Pt reports blurred vision. Denies eye pain, eye redness, ear pain, ringing in the ears, wax buildup,  runny nose, nasal congestion, bloody nose, or sore throat. Respiratory: Denies difficulty breathing, shortness of breath, cough or sputum production.   Cardiovascular: Pt reports palpitations. Denies chest pain, chest tightness, or swelling in the hands or feet.  Gastrointestinal: Pt reports chronic abdominal pain. Denies bloating, constipation, diarrhea or blood in the stool.  GU: Denies frequency, urgency, pain with urination, blood in urine, odor or discharge. Musculoskeletal: Pt reports generalized muscle pain. Denies decrease in range of motion, difficulty with gait, or joint pain and swelling.  Skin: Denies redness, rashes, lesions or ulcercations.  Neurological: Denies dizziness, difficulty with memory, difficulty with speech or problems with balance and coordination.   No other specific complaints in a complete review of systems (except as listed in HPI above).  PE:  BP 126/84  Pulse 92  Temp(Src) 97.4 F (36.3 C) (Oral)  Ht 5\' 6"  (1.676 m)  Wt 175 lb 12 oz (79.72 kg)  BMI 28.38 kg/m2  SpO2 98% Wt Readings from Last 3 Encounters:  02/06/13 175 lb 12 oz (79.72 kg)  07/11/12 169 lb (76.658 kg)  08/30/12 165 lb (74.844 kg)    General: Appears her stated age, chronically ill appearing in NAD. HEENT: Head: normal shape and size; Eyes: sclera white, no icterus, conjunctiva pink, PERRLA and EOMs intact; Ears: Tm's gray and intact, normal light reflex; Nose: mucosa pink and moist, septum midline; Throat/Mouth: Teeth present, mucosa pink and moist, no lesions or ulcerations noted.  Neck: Normal range of motion. Neck supple, trachea midline. No massses, lumps or thyromegaly present.  Cardiovascular: Normal rate and rhythm. S1,S2 noted.  No murmur, rubs or gallops noted. No JVD or BLE edema. No carotid bruits noted. Pulmonary/Chest: Normal effort and positive vesicular breath sounds. No respiratory distress. No wheezes, rales or ronchi noted.  Abdomen: Soft and tender in the epigastric.  Hypoactive bowel sounds, no bruits noted. No distention or masses noted. Liver, spleen and kidneys non palpable. Musculoskeletal: Normal range of motion. No signs of joint swelling. No difficulty with gait.  Neurological: Alert and oriented. Cranial nerves II-XII intact. Coordination normal. +DTRs bilaterally. Psychiatric: Mood very anxious, crying and affect flat. Behavior is normal. Judgment and thought content normal.   :  BMET    Component Value Date/Time   NA 136 06/30/2012 1335   K 3.7 06/30/2012 1335   CL 100 06/30/2012 1335   CO2 28 06/30/2012 1335   GLUCOSE 88 06/30/2012 1335   BUN 6 06/30/2012 1335   CREATININE 0.71 06/30/2012 1335   CALCIUM 8.8 06/30/2012 1335   GFRNONAA >90 06/30/2012  1335   GFRAA >90 06/30/2012 1335    Lipid Panel  No results found for this basename: chol, trig, hdl, cholhdl, vldl, ldlcalc    CBC    Component Value Date/Time   WBC 4.2 06/30/2012 1335   RBC 4.56 06/30/2012 1335   HGB 13.0 06/30/2012 1335   HCT 36.3 06/30/2012 1335   PLT 260 06/30/2012 1335   MCV 79.6 06/30/2012 1335   MCH 28.5 06/30/2012 1335   MCHC 35.8 06/30/2012 1335   RDW 13.7 06/30/2012 1335   LYMPHSABS 1.6 06/30/2012 1335   MONOABS 0.3 06/30/2012 1335   EOSABS 0.0 06/30/2012 1335   BASOSABS 0.0 06/30/2012 1335    Hgb A1C No results found for this basename: HGBA1C     Assessment and Plan:  Preventative Health Maintenance:  Flu shot today Will obtain records to see what labs have been done and to see when your last tetanus shot was  RTC every 6 months

## 2013-02-11 ENCOUNTER — Encounter: Payer: Self-pay | Admitting: *Deleted

## 2013-02-11 ENCOUNTER — Ambulatory Visit (INDEPENDENT_AMBULATORY_CARE_PROVIDER_SITE_OTHER): Payer: 59 | Admitting: Internal Medicine

## 2013-02-11 DIAGNOSIS — I2510 Atherosclerotic heart disease of native coronary artery without angina pectoris: Secondary | ICD-10-CM

## 2013-02-11 DIAGNOSIS — R002 Palpitations: Secondary | ICD-10-CM

## 2013-02-11 NOTE — Progress Notes (Deleted)
Exercise Treadmill Test  Pre-Exercise Testing Evaluation Rhythm: normal sinus  Rate: 76     Test  Exercise Tolerance Test Ordering MD: Angelina Sheriff, MD  Interpreting MD: Tereso Newcomer, PA-C  Unique Test No: 1  Treadmill:  1  Indication for ETT: known ASHD  Contraindication to ETT: No   Stress Modality: exercise - treadmill  Cardiac Imaging Performed: non   Protocol: standard Bruce - maximal  Max BP:  ***/***  Max MPHR (bpm):  146 85% MPR (bpm):  124  MPHR obtained (bpm):  *** % MPHR obtained:  ***  Reached 85% MPHR (min:sec):  *** Total Exercise Time (min-sec):  ***  Workload in METS:  *** Borg Scale: ***  Reason ETT Terminated:  {CHL REASON TERMINATED FOR ETT:21021064}    ST Segment Analysis At Rest: {CHL ST SEGMENT AT REST FOR ZOX:09604540} With Exercise: {CHL ST SEGMENT WITH EXERCISE FOR JWJ:19147829}  Other Information Arrhythmia:  {CHL ARRHYTHMIA FOR FAO:13086578} Angina during ETT:  {CHL ANGINA DURING ION:62952841} Quality of ETT:  {CHL QUALITY OF LKG:40102725}  ETT Interpretation:  {CHL INTERPRETATION FOR DGU:44034742}  Comments: ***  Recommendations: test cancelled reschedule as lexiscan ***

## 2013-02-11 NOTE — Progress Notes (Signed)
Patient ID: Judy Gibbs, female   DOB: 1965-08-27, 47 y.o.   MRN: [id number] E-Cardio verite 30 day cardiac event monitor applied to patient.

## 2013-02-12 ENCOUNTER — Encounter (HOSPITAL_COMMUNITY): Payer: 59

## 2013-02-20 ENCOUNTER — Ambulatory Visit: Payer: 59 | Admitting: Internal Medicine

## 2013-02-21 ENCOUNTER — Ambulatory Visit: Payer: 59 | Admitting: Internal Medicine

## 2013-02-24 ENCOUNTER — Ambulatory Visit (HOSPITAL_COMMUNITY): Payer: 59 | Attending: Internal Medicine | Admitting: Radiology

## 2013-02-24 VITALS — BP 119/72 | Ht 66.0 in | Wt 173.0 lb

## 2013-02-24 DIAGNOSIS — E669 Obesity, unspecified: Secondary | ICD-10-CM | POA: Insufficient documentation

## 2013-02-24 DIAGNOSIS — R0609 Other forms of dyspnea: Secondary | ICD-10-CM | POA: Insufficient documentation

## 2013-02-24 DIAGNOSIS — R9431 Abnormal electrocardiogram [ECG] [EKG]: Secondary | ICD-10-CM | POA: Insufficient documentation

## 2013-02-24 DIAGNOSIS — R110 Nausea: Secondary | ICD-10-CM | POA: Insufficient documentation

## 2013-02-24 DIAGNOSIS — R0789 Other chest pain: Secondary | ICD-10-CM | POA: Insufficient documentation

## 2013-02-24 DIAGNOSIS — R42 Dizziness and giddiness: Secondary | ICD-10-CM | POA: Insufficient documentation

## 2013-02-24 DIAGNOSIS — R079 Chest pain, unspecified: Secondary | ICD-10-CM

## 2013-02-24 DIAGNOSIS — R002 Palpitations: Secondary | ICD-10-CM | POA: Insufficient documentation

## 2013-02-24 DIAGNOSIS — R000 Tachycardia, unspecified: Secondary | ICD-10-CM | POA: Insufficient documentation

## 2013-02-24 DIAGNOSIS — R0989 Other specified symptoms and signs involving the circulatory and respiratory systems: Secondary | ICD-10-CM | POA: Insufficient documentation

## 2013-02-24 DIAGNOSIS — R0602 Shortness of breath: Secondary | ICD-10-CM | POA: Insufficient documentation

## 2013-02-24 DIAGNOSIS — Z87891 Personal history of nicotine dependence: Secondary | ICD-10-CM | POA: Insufficient documentation

## 2013-02-24 MED ORDER — TECHNETIUM TC 99M SESTAMIBI GENERIC - CARDIOLITE
11.0000 | Freq: Once | INTRAVENOUS | Status: AC | PRN
  Administered 2013-02-24: 11 via INTRAVENOUS

## 2013-02-24 MED ORDER — AMINOPHYLLINE 25 MG/ML IV SOLN
75.0000 mg | Freq: Once | INTRAVENOUS | Status: AC
  Administered 2013-02-24: 75 mg via INTRAVENOUS

## 2013-02-24 MED ORDER — REGADENOSON 0.4 MG/5ML IV SOLN
0.4000 mg | Freq: Once | INTRAVENOUS | Status: AC
  Administered 2013-02-24: 0.4 mg via INTRAVENOUS

## 2013-02-24 MED ORDER — TECHNETIUM TC 99M SESTAMIBI GENERIC - CARDIOLITE
33.0000 | Freq: Once | INTRAVENOUS | Status: AC | PRN
  Administered 2013-02-24: 33 via INTRAVENOUS

## 2013-02-24 NOTE — Progress Notes (Signed)
[facility] SITE 3 NUCLEAR MED [address] [place], [place] [address] [phone]    Cardiology Nuclear Med Study  GEARLDINE LOONEY is a 47 y.o. female     MRN : [id number]     DOB: [birth year]  Procedure Date: 02/24/2013  Nuclear Med Background Indication for Stress Test:  Evaluation for Ischemia and Abnormal EKG History:  '12 MPS:EF=66%,no ischemia and Echo:EF=60-65% Cardiac Risk Factors: History of Smoking and Obesity  Symptoms:  Chest Pain at Rest and  with Exertion (last date of chest discomfort 7/14), Dizziness, DOE, Nausea, Palpitations, Rapid HR and SOB   Nuclear Pre-Procedure Caffeine/Decaff Intake:  None NPO After: 8:00am   Lungs:  clear O2 Sat: 98% on room air. IV 0.9% NS with Angio Cath:  22g  IV Site: R Hand  IV Started by:  Cathlyn Parsons, RN  Chest Size (in):  32 Cup Size: B  Height: 5\' 6"  (1.676 m)  Weight:  173 lb (78.472 kg)  BMI:  Body mass index is 27.94 kg/(m^2). Tech Comments: Patient is lighthead,Left arm numbness,Right sided of face numb and HA this am. Assessment of stroke symptoms done.    Nuclear Med Study 1 or 2 day study: 1 day  Stress Test Type:  Eugenie Birks  Reading MD: Judy Millers, MD  Order Authorizing Provider:  Ferman Hamming  Resting Radionuclide: Technetium 82m Sestamibi  Resting Radionuclide Dose: 11.0 mCi   Stress Radionuclide:  Technetium 72m Sestamibi  Stress Radionuclide Dose: 33.0 mCi           Stress Protocol Rest HR: 77 Stress HR: 120  Rest BP: 119/72 Stress BP: 121/72  Exercise Time (min): n/a METS: n/a   Predicted Max HR: 172 bpm % Max HR: 69.77 bpm Rate Pressure Product: 21308   Dose of Adenosine (mg):  n/a Dose of Lexiscan: 0.4 mg  Dose of Atropine (mg): n/a Dose of Dobutamine: n/a mcg/kg/min (at max HR)  Stress Test Technologist: Cathlyn Parsons, RN  Nuclear Technologist:  Doyne Keel, CNMT     Rest Procedure:  Myocardial perfusion imaging was performed at rest 45 minutes following the  intravenous administration of Technetium 12m Sestamibi. Rest ECG: NSR with non-specific ST-T wave changes  Stress Procedure:  The patient received IV Lexiscan 0.4 mg over 15-seconds.  Technetium 64m Sestamibi injected at 30-seconds. Patient had chest pressure 3-6/10,lightheadness,fatique and Headache with infusion and continued in recovery. Patient received Aminophylline 75 mg IV x 2 doses and NTG 0.4 mg SL x 2 doses for continued chest pressure. Patient chest pressure never totally relieved 3/10 when images were obtained.Quantitative spect images were obtained after a 45 minute delay. Stress ECG: No significant ST segment change suggestive of ischemia.  QPS Raw Data Images:  Acquisition technically good; normal left ventricular size. Stress Images:  Normal homogeneous uptake in all areas of the myocardium. Rest Images:  Normal homogeneous uptake in all areas of the myocardium. Subtraction (SDS):  No evidence of ischemia. Transient Ischemic Dilatation (Normal <1.22):  n/a Lung/Heart Ratio (Normal <0.45):  0.34  Quantitative Gated Spect Images QGS EDV:  58 ml QGS ESV:  10 ml  Impression Exercise Capacity:  Lexiscan with no exercise. BP Response:  Normal blood pressure response. Clinical Symptoms:  Prolonged chest pain with the infusion. ECG Impression:  No significant ST segment change suggestive of ischemia. Comparison with Prior Nuclear Study: No significant change from previous study  Overall Impression:  Normal stress nuclear study.  LV Ejection Fraction: 83%.  LV Wall Motion:  NL  LV Function; NL Wall Motion   Judy Gibbs

## 2013-02-27 ENCOUNTER — Other Ambulatory Visit: Payer: Self-pay

## 2013-02-27 ENCOUNTER — Emergency Department (HOSPITAL_COMMUNITY): Payer: 59

## 2013-02-27 ENCOUNTER — Encounter (HOSPITAL_COMMUNITY): Payer: Self-pay | Admitting: Emergency Medicine

## 2013-02-27 ENCOUNTER — Telehealth: Payer: Self-pay | Admitting: Internal Medicine

## 2013-02-27 ENCOUNTER — Emergency Department (HOSPITAL_COMMUNITY)
Admission: EM | Admit: 2013-02-27 | Discharge: 2013-02-27 | Disposition: A | Discharge status: Home / Self Care | Payer: 59 | Attending: Emergency Medicine | Admitting: Emergency Medicine

## 2013-02-27 DIAGNOSIS — K219 Gastro-esophageal reflux disease without esophagitis: Secondary | ICD-10-CM | POA: Insufficient documentation

## 2013-02-27 DIAGNOSIS — Z79899 Other long term (current) drug therapy: Secondary | ICD-10-CM | POA: Insufficient documentation

## 2013-02-27 DIAGNOSIS — F3289 Other specified depressive episodes: Secondary | ICD-10-CM | POA: Insufficient documentation

## 2013-02-27 DIAGNOSIS — Z87891 Personal history of nicotine dependence: Secondary | ICD-10-CM | POA: Insufficient documentation

## 2013-02-27 DIAGNOSIS — Z8739 Personal history of other diseases of the musculoskeletal system and connective tissue: Secondary | ICD-10-CM | POA: Insufficient documentation

## 2013-02-27 DIAGNOSIS — F329 Major depressive disorder, single episode, unspecified: Secondary | ICD-10-CM | POA: Insufficient documentation

## 2013-02-27 DIAGNOSIS — R0789 Other chest pain: Secondary | ICD-10-CM | POA: Insufficient documentation

## 2013-02-27 DIAGNOSIS — F411 Generalized anxiety disorder: Secondary | ICD-10-CM | POA: Insufficient documentation

## 2013-02-27 DIAGNOSIS — Z8742 Personal history of other diseases of the female genital tract: Secondary | ICD-10-CM | POA: Insufficient documentation

## 2013-02-27 DIAGNOSIS — R110 Nausea: Secondary | ICD-10-CM | POA: Insufficient documentation

## 2013-02-27 DIAGNOSIS — E669 Obesity, unspecified: Secondary | ICD-10-CM | POA: Insufficient documentation

## 2013-02-27 LAB — CBC WITH DIFFERENTIAL/PLATELET
Basophils Relative: 1 % (ref 0–1)
Eosinophils Relative: 4 % (ref 0–5)
HCT: 32.2 % — ABNORMAL LOW (ref 36.0–46.0)
Hemoglobin: 11.7 g/dL — ABNORMAL LOW (ref 12.0–15.0)
Lymphocytes Relative: 37 % (ref 12–46)
MCH: 29.2 pg (ref 26.0–34.0)
MCHC: 36.3 g/dL — ABNORMAL HIGH (ref 30.0–36.0)
MCV: 80.3 fL (ref 78.0–100.0)
Monocytes Absolute: 0.3 10*3/uL (ref 0.1–1.0)
Monocytes Relative: 8 % (ref 3–12)
Neutro Abs: 2 10*3/uL (ref 1.7–7.7)
Platelets: 196 10*3/uL (ref 150–400)
RDW: 13.7 % (ref 11.5–15.5)
WBC: 3.9 10*3/uL — ABNORMAL LOW (ref 4.0–10.5)

## 2013-02-27 LAB — BASIC METABOLIC PANEL
BUN: 6 mg/dL (ref 6–23)
CO2: 24 mEq/L (ref 19–32)
Calcium: 7.5 mg/dL — ABNORMAL LOW (ref 8.4–10.5)
Chloride: 110 mEq/L (ref 96–112)
Creatinine, Ser: 0.65 mg/dL (ref 0.50–1.10)
Glucose, Bld: 77 mg/dL (ref 70–99)
Sodium: 144 mEq/L (ref 135–145)

## 2013-02-27 LAB — POCT I-STAT TROPONIN I: Troponin i, poc: 0 ng/mL (ref 0.00–0.08)

## 2013-02-27 MED ORDER — POTASSIUM CHLORIDE CRYS ER 20 MEQ PO TBCR
40.0000 meq | EXTENDED_RELEASE_TABLET | Freq: Once | ORAL | Status: AC
  Administered 2013-02-27: 40 meq via ORAL
  Filled 2013-02-27: qty 2

## 2013-02-27 MED ORDER — SODIUM CHLORIDE 0.9 % IV BOLUS (SEPSIS)
1000.0000 mL | Freq: Once | INTRAVENOUS | Status: AC
  Administered 2013-02-27: 1000 mL via INTRAVENOUS

## 2013-02-27 MED ORDER — ONDANSETRON HCL 4 MG/2ML IJ SOLN
4.0000 mg | INTRAMUSCULAR | Status: AC
  Administered 2013-02-27: 4 mg via INTRAVENOUS
  Filled 2013-02-27: qty 2

## 2013-02-27 MED ORDER — LORAZEPAM 2 MG/ML IJ SOLN
1.0000 mg | Freq: Once | INTRAMUSCULAR | Status: AC
  Administered 2013-02-27: 1 mg via INTRAVENOUS
  Filled 2013-02-27: qty 1

## 2013-02-27 MED ORDER — MORPHINE SULFATE 4 MG/ML IJ SOLN
4.0000 mg | Freq: Once | INTRAMUSCULAR | Status: AC
  Administered 2013-02-27: 4 mg via INTRAVENOUS
  Filled 2013-02-27: qty 1

## 2013-02-27 NOTE — ED Provider Notes (Signed)
Medical screening examination/treatment/procedure(s) were performed by non-physician practitioner and as supervising physician I was immediately available for consultation/collaboration.   Celene Kras, MD 02/27/13 1323

## 2013-02-27 NOTE — ED Notes (Signed)
Per [facility], pt from home for chest pressure which has been recurrent over a month. Has been on a halter monitor with nothing changed on it for over a month. States the pain is mid chest and moves down into her abd. Denies any associated symptoms except pain with deep breaths. 18g to LAC. NTG x 2 given, 324 mg ASA and 4 mg of Zofran. ST on the monitor. VSS.

## 2013-02-27 NOTE — Telephone Encounter (Signed)
New Problem  Request stress test results. Pt requests to speak with Dr. Graciela Husbands specifically.

## 2013-02-27 NOTE — ED Provider Notes (Signed)
CSN: [id number]     Arrival date & time 02/27/13  1031 History   First MD Initiated Contact with Patient 02/27/13 1033     Chief Complaint  Patient presents with  . Chest Pain   (Consider location/radiation/quality/duration/timing/severity/associated sxs/prior Treatment) HPI Comments: Patient is a 47 year old female with an extensive past medical history including bipolar disorder, IBS, and colonic inertia s/p ileostomy one year ago who presents for chest pain with onset 3 days ago. Patient states that pain began after her nuclear stress test and has persisted since. She states the pain is constant and waxing and waning in severity. Patient describes the pain as pressure-like in nature and nonradiating. She states the pain is worse with deep breathing and exertion, as well as certain movements. She admits to some improvement in her chest pain with nitroglycerin given by EMS prior to arrival. Patient states that she has been having recurrent chest pain over the past month. Patient has been seeing Dr. Graciela Husbands of [facility] cardiology for this reason. Patient's nuclear stress test on 02/24/2013 was normal without any ST changes; LV ejection fraction 83%. Patient has never had a cardiac catheterization.  She denies associated fever, diaphoresis, cough or hemoptysis, vomiting, abdominal pain, and numbness or tingling in her extremities. Patient denies a history of ACS, CAD, DVT/PE, and coagulopathies. She denies family history of ACS and coronary artery disease; she states that her father did have a stroke in his late 65s. Patient is followed by Dr. Glendell Docker at [facility] for her IBS and colonic inertia. 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 Patient last endoscopy was one year ago and was normal, per patient.  Patient is a 47 y.o. female presenting with chest pain. The history is provided by the patient. No language interpreter was  used.  Chest Pain Associated symptoms: nausea (chronic)   Associated symptoms: no abdominal pain, no fever, no numbness, no shortness of breath and not vomiting     Past Medical History  Diagnosis Date  . Bipolar disorder   . DDD (degenerative disc disease), cervical   . Anxiety   . Depression   . Fibromyalgia   . GERD (gastroesophageal reflux disease)   . IBS (irritable bowel syndrome)   . Raynauds phenomenon   . Gastroparesis   . Colonic inertia   . Proctalgia   . Pelvic floor dysfunction   . Hypoglycemia   . Interstitial cystitis   . Obesity   . Vertigo    Past Surgical History  Procedure Laterality Date  . Cholecystectomy    . Hysterectomy - unknown type    . Total knee arthroplasty  89  & 10    R & L  . Colonoscopy  08/08/2001    normal, Dr. Lina Sar  . Upper gastrointestinal endoscopy  06/15/2006    hiatal hernia, gastritis  . Esophagogastroduodenoscopy  12/20/1993    mild antral gastritis, Dr. Lina Sar  . Edg  06/28/1987    Dr. Matthias Hughs, bile in stomach  . Upper gastrointestinal endoscopy  07/04/2001    Dr. Lina Sar  . Diverting ileostomy  2013  . Abdominal hysterectomy  2001    has one ovary   Family History  Problem Relation Age of Onset  . Colon polyps Paternal Uncle   . Esophageal cancer Paternal Uncle   . Diabetes Father   . Heart disease Father   . Stroke Father   . Hyperlipidemia Father   .  Hyperlipidemia Mother   . Cancer Paternal Aunt     breast  . Early death Neg Hx    History  Substance Use Topics  . Smoking status: Former Smoker    Quit date: 05/22/1982  . Smokeless tobacco: Never Used  . Alcohol Use: No   OB History   Grav Para Term Preterm Abortions TAB SAB Ect Mult Living                 Review of Systems  Constitutional: Negative for fever.  Respiratory: Negative for shortness of breath.   Cardiovascular: Positive for chest pain.  Gastrointestinal: Positive for nausea (chronic). Negative for vomiting and abdominal pain.   Neurological: Negative for syncope and numbness.  All other systems reviewed and are negative.    Allergies  Celecoxib and Reglan  Home Medications   Current Outpatient Rx  Name  Route  Sig  Dispense  Refill  . ALPRAZolam (XANAX) 1 MG tablet   Oral   Take 1 mg by mouth 2 (two) times daily as needed for sleep or anxiety.         . Blood Glucose Monitoring Suppl SUPPLIES MISC      Check bs qam   1 each   0     Pt needs new monitor and corresponding supplies -  ...   . calcium-vitamin D (OSCAL) 250-125 MG-UNIT per tablet   Oral   Take 2 tablets by mouth every morning.          . cholestyramine (QUESTRAN) 4 G packet   Oral   Take 1 packet by mouth 4 (four) times daily as needed (for diarrhea).          . dronabinol (MARINOL) 2.5 MG capsule   Oral   Take 2.5 mg by mouth 2 (two) times daily as needed (nausea).         . Ibuprofen (ADVIL MIGRAINE PO)   Oral   Take 1 tablet by mouth daily as needed (for migraines).          . Multiple Vitamins-Minerals (ADEKS) chewable tablet   Oral   Chew 2 tablets by mouth every morning. Multivitamin gummie         . omeprazole (PRILOSEC) 40 MG capsule      TAKE 1 CAPSULE BY MOUTH TWICE DAILY   60 capsule   0     Overdue for office visit, call office   . ondansetron (ZOFRAN-ODT) 8 MG disintegrating tablet   Oral   Take 8 mg by mouth every 8 (eight) hours as needed for nausea.         . promethazine (PHENERGAN) 6.25 MG/5ML syrup   Oral   Take 20 mLs by mouth 4 (four) times daily as needed for nausea.         . tapentadol (NUCYNTA) 50 MG TABS   Oral   Take 50 mg by mouth.         . traZODone (DESYREL) 100 MG tablet   Oral   Take 100 mg by mouth at bedtime.          Marland Kitchen EPINEPHrine (EPI-PEN) 0.3 mg/0.3 mL DEVI   Intramuscular   Inject 0.3 mLs (0.3 mg total) into the muscle once.   2 Device   1    BP 99/54  Pulse 70  Temp(Src) 98.2 F (36.8 C) (Oral)  Resp 14  SpO2 98%  Physical Exam   Nursing note and vitals reviewed. Constitutional: She is oriented to person,  place, and time. She appears well-developed and well-nourished. No distress.  HENT:  Head: Normocephalic and atraumatic.  Mouth/Throat: Oropharynx is clear and moist. No oropharyngeal exudate.  Eyes: Conjunctivae and EOM are normal. Pupils are equal, round, and reactive to light. No scleral icterus.  Neck: Normal range of motion.  Cardiovascular: Normal rate, regular rhythm, normal heart sounds and intact distal pulses.   Heart rate 95bpm  Pulmonary/Chest: Effort normal. No respiratory distress. She has no wheezes. She has no rales.  Abdominal: Soft. She exhibits no distension. There is no tenderness. There is no rebound and no guarding.  Well appearing ostomy in right lower quadrant. Decreased bowel sounds.  Musculoskeletal: Normal range of motion.  Neurological: She is alert and oriented to person, place, and time.  Skin: Skin is warm and dry. No rash noted. She is not diaphoretic. No erythema. No pallor.  Psychiatric: She has a normal mood and affect. Her behavior is normal.    ED Course  Procedures (including critical care time) Labs Review Labs Reviewed  CBC WITH DIFFERENTIAL - Abnormal; Notable for the following:    WBC 3.9 (*)    Hemoglobin 11.7 (*)    HCT 32.2 (*)    MCHC 36.3 (*)    All other components within normal limits  BASIC METABOLIC PANEL - Abnormal; Notable for the following:    Potassium 3.3 (*)    Calcium 7.5 (*)    All other components within normal limits  POCT I-STAT TROPONIN I   Imaging Review Dg Chest 2 View  02/27/2013   CLINICAL DATA:  Chest pain when taking a deep breath, former smoker  EXAM: CHEST  2 VIEW  COMPARISON:  06/30/2012  FINDINGS: Normal heart size, mediastinal contours, and pulmonary vascularity.  Mild peribronchial thickening.  Question minimal scarring in right middle lobe.  No acute infiltrate, pleural effusion, or pneumothorax.  No acute osseous findings.   Surgical clips right upper quadrant question cholecystectomy.  IMPRESSION: Mild bronchitic changes without infiltrate.   Electronically Signed   By: Ulyses Southward M.D.   On: 02/27/2013 12:09     Date: 02/27/2013  Rate: 95  Rhythm: normal sinus rhythm  QRS Axis: normal  Intervals: normal  ST/T Wave abnormalities: normal  Conduction Disutrbances:none  Narrative Interpretation: NSR; no STEMI and unchanged from prior  Old EKG Reviewed: unchanged from 12/12/2012 I have personally reviewed and interpreted this EKG   MDM   1. Atypical chest pain    Patient is a 47 year old female who presents for atypical chest pain which he describes as a pressure. Symptom onset was following her nuclear stress test 3 days ago. Patient states that symptoms worsened this morning. Patient well and nontoxic appearing, hemodynamically stable, and afebrile on arrival. Labs consistent with priors and chest x-ray without evidence of pneumonia, pneumothorax, pleural effusion, or other acute cardiopulmonary abnormality by my interpretation. EKG unchanged from 12/12/2012 and troponin 0.00.   Doubt ACS given atypical nature of symptoms, physical exam, reassuring workup today, and lack of risk factors. Patient also without family history of ACS or coronary artery disease. Patient endorses marked improvement in symptoms after receiving IV Ativan. Suspect that chest pain is secondary to anxiety; patient does have a known history of bipolar disorder. She is currently being followed by Dr. Graciela Husbands of [facility] cardiology for her chest pain which has been recurrent over the past month. Believe patient is stable and appropriate for discharge with cardiology followup for further evaluation of symptoms. Return precautions discussed with the patient  who verbalizes comfort and understanding with this discharge plan with no unaddressed concerns.    Antony Madura, PA-C 02/27/13 1320

## 2013-02-27 NOTE — ED Notes (Signed)
Kelly, PA at the bedside.  

## 2013-03-01 NOTE — Telephone Encounter (Addendum)
Spoke with husband who expressed her concern regarding the pain with stress test   Stress results reviewed and thnk this is almost certainly not cardiac

## 2013-03-03 ENCOUNTER — Telehealth: Payer: Self-pay | Admitting: *Deleted

## 2013-03-03 NOTE — Telephone Encounter (Signed)
Pt called requesting Rx for new glucometer and test strips.  Please advise

## 2013-03-04 MED ORDER — BLOOD GLUCOSE MONITORING SUPPL SUPPLIES MISC: Status: DC

## 2013-03-04 NOTE — Telephone Encounter (Signed)
ok 

## 2013-03-10 ENCOUNTER — Telehealth: Payer: Self-pay | Admitting: Internal Medicine

## 2013-03-10 MED ORDER — BLOOD GLUCOSE MONITORING SUPPL SUPPLIES MISC: Status: DC

## 2013-03-10 NOTE — Telephone Encounter (Signed)
ok 

## 2013-03-10 NOTE — Telephone Encounter (Signed)
Patients glucose monitor broke and she is requesting a script be sent to [facility] on [address] for a new meter and test strips

## 2013-03-14 ENCOUNTER — Encounter: Payer: Self-pay | Admitting: *Deleted

## 2013-03-24 ENCOUNTER — Ambulatory Visit (INDEPENDENT_AMBULATORY_CARE_PROVIDER_SITE_OTHER): Payer: 59 | Admitting: Internal Medicine

## 2013-03-24 ENCOUNTER — Encounter: Payer: Self-pay | Admitting: Internal Medicine

## 2013-03-24 VITALS — BP 115/76 | HR 100 | Ht 66.0 in | Wt 174.1 lb

## 2013-03-24 DIAGNOSIS — R002 Palpitations: Secondary | ICD-10-CM

## 2013-03-24 NOTE — Patient Instructions (Signed)
Your physician recommends that you continue on your current medications as directed. Please refer to the Current Medication list given to you today.  No follow up at this time with Dr. Graciela Husbands.

## 2013-03-24 NOTE — Progress Notes (Signed)
Patient Care Team: Nicki Reaper, NP as PCP - General (Internal Medicine)   HPI  Judy Gibbs is a 47 y.o. female Seen in followup for palpitations which are consistent with sinus tachycardia in context of chronic dehydration.  Past Medical History  Diagnosis Date  . Bipolar disorder   . DDD (degenerative disc disease), cervical   . Anxiety   . Depression   . Fibromyalgia   . GERD (gastroesophageal reflux disease)   . IBS (irritable bowel syndrome)   . Raynauds phenomenon   . Gastroparesis   . Colonic inertia   . Proctalgia   . Pelvic floor dysfunction   . Hypoglycemia   . Interstitial cystitis   . Obesity   . Vertigo     Past Surgical History  Procedure Laterality Date  . Cholecystectomy    . Hysterectomy - unknown type    . Total knee arthroplasty  89  & 10    R & L  . Colonoscopy  08/08/2001    normal, Dr. Lina Sar  . Upper gastrointestinal endoscopy  06/15/2006    hiatal hernia, gastritis  . Esophagogastroduodenoscopy  12/20/1993    mild antral gastritis, Dr. Lina Sar  . Edg  06/28/1987    Dr. Matthias Hughs, bile in stomach  . Upper gastrointestinal endoscopy  07/04/2001    Dr. Lina Sar  . Diverting ileostomy  2013  . Abdominal hysterectomy  2001    has one ovary    Current Outpatient Prescriptions  Medication Sig Dispense Refill  . ALPRAZolam (XANAX) 1 MG tablet Take 1 mg by mouth 2 (two) times daily as needed for sleep or anxiety.      . Blood Glucose Monitoring Suppl SUPPLIES MISC Check bs qam  1 each  0  . calcium-vitamin D (OSCAL) 250-125 MG-UNIT per tablet Take 2 tablets by mouth every morning.       . cholestyramine (QUESTRAN) 4 G packet Take 1 packet by mouth 4 (four) times daily as needed (for diarrhea).       . dronabinol (MARINOL) 2.5 MG capsule Take 2.5 mg by mouth 2 (two) times daily as needed (nausea).      . EPINEPHrine (EPI-PEN) 0.3 mg/0.3 mL DEVI Inject 0.3 mLs (0.3 mg total) into the muscle once.  2 Device  1  . Ibuprofen (ADVIL  MIGRAINE PO) Take 1 tablet by mouth daily as needed (for migraines).       . Multiple Vitamins-Minerals (ADEKS) chewable tablet Chew 2 tablets by mouth every morning. Multivitamin gummie      . omeprazole (PRILOSEC) 40 MG capsule TAKE 1 CAPSULE BY MOUTH TWICE DAILY  60 capsule  0  . ondansetron (ZOFRAN-ODT) 8 MG disintegrating tablet Take 8 mg by mouth every 8 (eight) hours as needed for nausea.      . promethazine (PHENERGAN) 6.25 MG/5ML syrup Take 20 mLs by mouth 4 (four) times daily as needed for nausea.      . tapentadol (NUCYNTA) 50 MG TABS Take 50 mg by mouth.      . traZODone (DESYREL) 100 MG tablet Take 100 mg by mouth at bedtime.        No current facility-administered medications for this visit.    Allergies  Allergen Reactions  . Celecoxib     REACTION: hives  . Reglan [Metoclopramide] Anxiety    Review of Systems negative except from HPI and PMH  Physical Exam BP 115/76  Pulse 100  Ht 5\' 6"  (1.676 m)  Wt 174 lb 1.9 oz (78.98 kg)  BMI 28.12 kg/m2  Well developed and nourished in no acute distress HENT normal Neck supple with JVP-flat Clear Regular rate and rhythm, no murmurs or gallops Abd-soft with active BS No Clubbing cyanosis edema Skin-warm and dry A & Oriented  Grossly normal sensory and motor function     Assessment and  Plan

## 2013-03-24 NOTE — Assessment & Plan Note (Signed)
We reviewed her strips extensively  Rare PVC but mostly sinus rhythm  Which is secondary

## 2013-03-27 ENCOUNTER — Emergency Department (HOSPITAL_COMMUNITY)
Admission: EM | Admit: 2013-03-27 | Discharge: 2013-03-27 | Disposition: A | Discharge status: Home / Self Care | Payer: 59 | Attending: Emergency Medicine | Admitting: Emergency Medicine

## 2013-03-27 ENCOUNTER — Encounter (HOSPITAL_COMMUNITY): Payer: Self-pay | Admitting: Emergency Medicine

## 2013-03-27 ENCOUNTER — Telehealth: Payer: Self-pay | Admitting: Internal Medicine

## 2013-03-27 DIAGNOSIS — F319 Bipolar disorder, unspecified: Secondary | ICD-10-CM | POA: Insufficient documentation

## 2013-03-27 DIAGNOSIS — R109 Unspecified abdominal pain: Secondary | ICD-10-CM | POA: Insufficient documentation

## 2013-03-27 DIAGNOSIS — Z8739 Personal history of other diseases of the musculoskeletal system and connective tissue: Secondary | ICD-10-CM | POA: Insufficient documentation

## 2013-03-27 DIAGNOSIS — F411 Generalized anxiety disorder: Secondary | ICD-10-CM | POA: Insufficient documentation

## 2013-03-27 DIAGNOSIS — Z8742 Personal history of other diseases of the female genital tract: Secondary | ICD-10-CM | POA: Insufficient documentation

## 2013-03-27 DIAGNOSIS — R42 Dizziness and giddiness: Secondary | ICD-10-CM | POA: Insufficient documentation

## 2013-03-27 DIAGNOSIS — K6289 Other specified diseases of anus and rectum: Secondary | ICD-10-CM | POA: Insufficient documentation

## 2013-03-27 DIAGNOSIS — R110 Nausea: Secondary | ICD-10-CM

## 2013-03-27 DIAGNOSIS — Z87891 Personal history of nicotine dependence: Secondary | ICD-10-CM | POA: Insufficient documentation

## 2013-03-27 DIAGNOSIS — Z8669 Personal history of other diseases of the nervous system and sense organs: Secondary | ICD-10-CM | POA: Insufficient documentation

## 2013-03-27 DIAGNOSIS — R5381 Other malaise: Secondary | ICD-10-CM | POA: Insufficient documentation

## 2013-03-27 DIAGNOSIS — R630 Anorexia: Secondary | ICD-10-CM | POA: Insufficient documentation

## 2013-03-27 DIAGNOSIS — Z79899 Other long term (current) drug therapy: Secondary | ICD-10-CM | POA: Insufficient documentation

## 2013-03-27 DIAGNOSIS — E669 Obesity, unspecified: Secondary | ICD-10-CM | POA: Insufficient documentation

## 2013-03-27 DIAGNOSIS — K219 Gastro-esophageal reflux disease without esophagitis: Secondary | ICD-10-CM | POA: Insufficient documentation

## 2013-03-27 LAB — CBC WITH DIFFERENTIAL/PLATELET
Eosinophils Absolute: 0.1 10*3/uL (ref 0.0–0.7)
Eosinophils Relative: 2 % (ref 0–5)
HCT: 38.9 % (ref 36.0–46.0)
Hemoglobin: 14.3 g/dL (ref 12.0–15.0)
Lymphocytes Relative: 34 % (ref 12–46)
Lymphs Abs: 1.6 10*3/uL (ref 0.7–4.0)
MCH: 29.1 pg (ref 26.0–34.0)
MCV: 79.2 fL (ref 78.0–100.0)
Monocytes Relative: 6 % (ref 3–12)
Neutro Abs: 2.7 10*3/uL (ref 1.7–7.7)
Neutrophils Relative %: 57 % (ref 43–77)
RBC: 4.91 MIL/uL (ref 3.87–5.11)
WBC: 4.8 10*3/uL (ref 4.0–10.5)

## 2013-03-27 LAB — URINALYSIS, ROUTINE W REFLEX MICROSCOPIC
Ketones, ur: NEGATIVE mg/dL
Leukocytes, UA: NEGATIVE
Nitrite: NEGATIVE
Protein, ur: NEGATIVE mg/dL
Urobilinogen, UA: 0.2 mg/dL (ref 0.0–1.0)
pH: 5 (ref 5.0–8.0)

## 2013-03-27 LAB — COMPREHENSIVE METABOLIC PANEL
Alkaline Phosphatase: 94 U/L (ref 39–117)
BUN: 6 mg/dL (ref 6–23)
CO2: 26 mEq/L (ref 19–32)
GFR calc Af Amer: >90 mL/min (ref >90)
Glucose, Bld: 101 mg/dL — ABNORMAL HIGH (ref 70–99)
Potassium: 4.2 mEq/L (ref 3.5–5.1)
Total Protein: 6.5 g/dL (ref 6.0–8.3)

## 2013-03-27 LAB — CG4 I-STAT (LACTIC ACID): Lactic Acid, Venous: 0.39 mmol/L — ABNORMAL LOW (ref 0.5–2.2)

## 2013-03-27 LAB — LIPASE, BLOOD: Lipase: 38 U/L (ref 11–59)

## 2013-03-27 MED ORDER — PROMETHAZINE HCL 25 MG/ML IJ SOLN
25.0000 mg | Freq: Once | INTRAMUSCULAR | Status: AC
  Administered 2013-03-27: 25 mg via INTRAVENOUS
  Filled 2013-03-27: qty 1

## 2013-03-27 MED ORDER — SODIUM CHLORIDE 0.9 % IV BOLUS (SEPSIS)
1000.0000 mL | Freq: Once | INTRAVENOUS | Status: AC
  Administered 2013-03-27: 1000 mL via INTRAVENOUS

## 2013-03-27 MED ORDER — ONDANSETRON HCL 4 MG/2ML IJ SOLN
4.0000 mg | Freq: Once | INTRAMUSCULAR | Status: AC
  Administered 2013-03-27: 4 mg via INTRAVENOUS
  Filled 2013-03-27: qty 2

## 2013-03-27 NOTE — ED Notes (Signed)
Resting quietly

## 2013-03-27 NOTE — ED Notes (Signed)
Pt in stating she has gastroparesis and that she has been c/o nausea, states she is unable to drink anything, she is followed at [facility] and she is supposed to have IV fluids once a week but hasn't recently, states she is concerned she is dehydrated because she isn't able to take any fluid in

## 2013-03-27 NOTE — ED Notes (Signed)
IV removed from patients right AC 20 gauge cath intact

## 2013-03-27 NOTE — ED Notes (Signed)
Family member stated she is waiting for something else for nausea.

## 2013-03-27 NOTE — Telephone Encounter (Signed)
I have left a message that we received information from digestive health

## 2013-03-27 NOTE — ED Provider Notes (Signed)
CSN: [id number]     Arrival date & time 03/27/13  1554 History   First MD Initiated Contact with Patient 03/27/13 1729     Chief Complaint  Patient presents with  . Nausea    HPI  Patient presents with complaints of pain in multiple areas, nausea, anorexia, by mouth intolerance.  She has a notable history of multiple medical problems, including IBS, fibromyalgia, possible ileus.  She currently complains of worsening anorexia, fatigue, nausea.  There is no no vomiting.  She has ileostomy, notes that she continues to have liquid production through this bag. There are no new fever, no new confusion or disorientation. No new chest pain or dyspnea. Patient continues to take all medication as directed. Patient saw her neurologist 2 days ago. Patient states that she typically receives IV fluids, weekly, and has not done so in at least 2 weeks. History of present illness is per the patient and her husband, as the patient seems particularly tired.   Past Medical History  Diagnosis Date  . Bipolar disorder   . DDD (degenerative disc disease), cervical   . Anxiety   . Depression   . Fibromyalgia   . GERD (gastroesophageal reflux disease)   . IBS (irritable bowel syndrome)   . Raynauds phenomenon   . Gastroparesis   . Colonic inertia   . Proctalgia   . Pelvic floor dysfunction   . Hypoglycemia   . Interstitial cystitis   . Obesity   . Vertigo    Past Surgical History  Procedure Laterality Date  . Cholecystectomy    . Hysterectomy - unknown type    . Total knee arthroplasty  89  & 10    R & L  . Colonoscopy  08/08/2001    normal, Dr. Lina Sar  . Upper gastrointestinal endoscopy  06/15/2006    hiatal hernia, gastritis  . Esophagogastroduodenoscopy  12/20/1993    mild antral gastritis, Dr. Lina Sar  . Edg  06/28/1987    Dr. Matthias Hughs, bile in stomach  . Upper gastrointestinal endoscopy  07/04/2001    Dr. Lina Sar  . Diverting ileostomy  2013  . Abdominal hysterectomy  2001   has one ovary   Family History  Problem Relation Age of Onset  . Colon polyps Paternal Uncle   . Esophageal cancer Paternal Uncle   . Diabetes Father   . Heart disease Father   . Stroke Father   . Hyperlipidemia Father   . Hyperlipidemia Mother   . Cancer Paternal Aunt     breast  . Early death Neg Hx    History  Substance Use Topics  . Smoking status: Former Smoker    Quit date: 05/22/1982  . Smokeless tobacco: Never Used  . Alcohol Use: No   OB History   Grav Para Term Preterm Abortions TAB SAB Ect Mult Living                 Review of Systems  Constitutional:       Per HPI, otherwise negative  HENT:       Per HPI, otherwise negative  Respiratory:       Per HPI, otherwise negative  Cardiovascular:       Per HPI, otherwise negative  Gastrointestinal: Positive for nausea, abdominal pain and rectal pain. Negative for vomiting.  Endocrine:       Negative aside from HPI  Genitourinary:       Neg aside from HPI   Musculoskeletal:  Per HPI, otherwise negative  Skin: Negative.   Neurological: Positive for weakness and light-headedness. Negative for tremors, seizures, speech difficulty and headaches.    Allergies  Celecoxib and Reglan  Home Medications   Current Outpatient Rx  Name  Route  Sig  Dispense  Refill  . ALPRAZolam (XANAX) 1 MG tablet   Oral   Take 1 mg by mouth 2 (two) times daily as needed for sleep or anxiety.         . calcium-vitamin D (OSCAL) 250-125 MG-UNIT per tablet   Oral   Take 2 tablets by mouth every morning.          . dronabinol (MARINOL) 2.5 MG capsule   Oral   Take 2.5 mg by mouth 2 (two) times daily as needed (nausea).         . EPINEPHrine (EPI-PEN) 0.3 mg/0.3 mL DEVI   Intramuscular   Inject 0.3 mLs (0.3 mg total) into the muscle once.   2 Device   1   . Ibuprofen (ADVIL MIGRAINE PO)   Oral   Take 2 tablets by mouth daily as needed (for migraines).          . Multiple Vitamins-Minerals (ADEKS) chewable  tablet   Oral   Chew 2 tablets by mouth every morning. Multivitamin gummie         . omeprazole (PRILOSEC) 40 MG capsule   Oral   Take 40 mg by mouth 2 (two) times daily.         . ondansetron (ZOFRAN-ODT) 8 MG disintegrating tablet   Oral   Take 8 mg by mouth every 8 (eight) hours as needed for nausea.         Bertram Gala Glycol-Propyl Glycol (SYSTANE) 0.4-0.3 % SOLN   Both Eyes   Place 1 drop into both eyes daily as needed (for dry eyes).         . promethazine (PHENERGAN) 6.25 MG/5ML syrup   Oral   Take 20 mLs by mouth 4 (four) times daily as needed for nausea.         . Tapentadol HCl (NUCYNTA) 75 MG TABS   Oral   Take 75 mg by mouth every 4 (four) hours as needed (for pain).         . traZODone (DESYREL) 100 MG tablet   Oral   Take 100 mg by mouth at bedtime.           BP 108/60  Pulse 95  Temp(Src) 98.4 F (36.9 C) (Oral)  Resp 20  Wt 173 lb (78.472 kg)  SpO2 100% Physical Exam  Nursing note and vitals reviewed. Constitutional: She is oriented to person, place, and time. She appears listless. She appears ill. No distress.  HENT:  Head: Normocephalic and atraumatic.  Eyes: Conjunctivae and EOM are normal.  Cardiovascular: Normal rate and regular rhythm.   Pulmonary/Chest: Effort normal and breath sounds normal. No stridor. No respiratory distress.  Abdominal: She exhibits no distension. There is no tenderness.    Musculoskeletal: She exhibits no edema.  Neurological: She is oriented to person, place, and time. She appears listless. No cranial nerve deficit. She exhibits abnormal muscle tone. Coordination normal.  Skin: Skin is warm and dry.  Psychiatric: She has a normal mood and affect.    ED Course  Procedures (including critical care time) Labs Review Labs Reviewed  CBC WITH DIFFERENTIAL - Abnormal; Notable for the following:    MCHC 36.8 (*)    All other components  within normal limits  COMPREHENSIVE METABOLIC PANEL - Abnormal;  Notable for the following:    Glucose, Bld 101 (*)    All other components within normal limits  URINALYSIS, ROUTINE W REFLEX MICROSCOPIC  LIPASE, BLOOD   Imaging Review No results found.  EKG Interpretation   None      Prior to, and following the initial evaluation I reviewed the patient's chart, including evaluation by her neurologist 2 days ago.  Those notes suggest no ongoing significant neurologic dysfunction.  9:35 PM Patient resting, seemingly comfortably.  I discussed all findings with her and her husband.  She was discharged in stable condition. MDM  No diagnosis found. This patient with multiple medical problems, including chronic pain now presents with concern of ongoing nausea, anorexia, persistent liquid in her colostomy bag.  On exam she is awake alert, afebrile, hemodynamically stable.  Patient has a soft abdomen.  Given the patient's history of chronic pain, this is likely contributory.  Patient's labs were largely reassuring, with no evidence of dehydration, nor notable like abnormalities.  Patient was discharged in stable condition after she improved on IV fluid resuscitation.   Gerhard Munch, MD 03/27/13 2136

## 2013-03-28 ENCOUNTER — Other Ambulatory Visit: Payer: Self-pay

## 2013-03-28 DIAGNOSIS — E860 Dehydration: Secondary | ICD-10-CM

## 2013-03-28 DIAGNOSIS — K3184 Gastroparesis: Secondary | ICD-10-CM

## 2013-03-28 NOTE — Progress Notes (Signed)
Per Dr. Leone Payor patient needs to be scheduled for weekly NS 2000 cc IV at [facility].  Each liter over 1 hour.  She does not have a port.  She will go to admitting.  She is scheduled for the first infusion for 04/11/13 11:00.  [place] and her husband are notified.  They will set up her next weeks appt on the 21st.

## 2013-04-04 ENCOUNTER — Ambulatory Visit (HOSPITAL_COMMUNITY): Payer: 59

## 2013-04-11 ENCOUNTER — Inpatient Hospital Stay (HOSPITAL_COMMUNITY): Admission: RE | Admit: 2013-04-11 | Payer: 59 | Source: Ambulatory Visit

## 2013-04-15 ENCOUNTER — Encounter (HOSPITAL_COMMUNITY): Admission: RE | Admit: 2013-04-15 | Payer: 59 | Source: Ambulatory Visit

## 2013-04-16 ENCOUNTER — Telehealth: Payer: Self-pay | Admitting: Internal Medicine

## 2013-04-16 NOTE — Telephone Encounter (Signed)
Patient sent Dr. Graciela Husbands email this morning - I cannot see it in his basket (I explained this to patient's husband). He proceeded to explain their concern about need for PICC/Port for daily IVF therapies at home. He tells me that they finally heard from Dr. Alycia Rossetti at [facility] this morning, who is supposed to let them know about placing central line. Patient and her husband were hoping for help from Dr. Graciela Husbands if needed, to ensure she gets this line for the IVF. I explained that Dr Graciela Husbands would more than glad to help aid in this situation if needed. I advised them to call me back and let me know if they needed his assist with Dr. Alycia Rossetti. They are agreeable to plan and will keep [place] informed of plans.

## 2013-04-16 NOTE — Telephone Encounter (Signed)
New Problem:  Pt's husband states he sent Dr. Graciela Husbands an email. He states it is very lengthy but he would appreciate the doctor's input as soon as possible. Please advise

## 2013-04-16 NOTE — Telephone Encounter (Signed)
Called to inform me that they got approval for Port-a-cath placement. They are appreciative of our willingness to help if needed in this matter.

## 2013-05-08 ENCOUNTER — Ambulatory Visit: Payer: 59 | Admitting: Internal Medicine

## 2013-06-25 ENCOUNTER — Other Ambulatory Visit: Payer: Self-pay | Admitting: Internal Medicine

## 2013-06-25 NOTE — Telephone Encounter (Signed)
Last prescribed 02/06/13--please advise

## 2013-06-25 NOTE — Telephone Encounter (Signed)
Rx called in to pharmacy. 

## 2013-06-25 NOTE — Telephone Encounter (Signed)
Ok to phone in xanax 

## 2013-07-23 ENCOUNTER — Other Ambulatory Visit: Payer: Self-pay | Admitting: Internal Medicine

## 2013-07-23 NOTE — Telephone Encounter (Signed)
Ok to phone in xanax 

## 2013-07-23 NOTE — Telephone Encounter (Signed)
Rx called in to pharmacy. 

## 2013-07-23 NOTE — Telephone Encounter (Signed)
Last prescribed 06/25/13--please advise

## 2013-08-06 ENCOUNTER — Encounter: Payer: Self-pay | Admitting: Radiology

## 2013-08-07 ENCOUNTER — Encounter: Payer: Self-pay | Admitting: Internal Medicine

## 2013-08-07 ENCOUNTER — Ambulatory Visit (INDEPENDENT_AMBULATORY_CARE_PROVIDER_SITE_OTHER): Payer: 59 | Admitting: Internal Medicine

## 2013-08-07 ENCOUNTER — Ambulatory Visit: Payer: 59 | Admitting: Internal Medicine

## 2013-08-07 VITALS — BP 114/82 | HR 80 | Temp 98.2°F | Wt 188.0 lb

## 2013-08-07 DIAGNOSIS — K5989 Other specified functional intestinal disorders: Secondary | ICD-10-CM

## 2013-08-07 DIAGNOSIS — F341 Dysthymic disorder: Secondary | ICD-10-CM

## 2013-08-07 DIAGNOSIS — K599 Functional intestinal disorder, unspecified: Secondary | ICD-10-CM

## 2013-08-07 DIAGNOSIS — F32A Depression, unspecified: Secondary | ICD-10-CM

## 2013-08-07 DIAGNOSIS — F419 Anxiety disorder, unspecified: Principal | ICD-10-CM

## 2013-08-07 DIAGNOSIS — F329 Major depressive disorder, single episode, unspecified: Secondary | ICD-10-CM

## 2013-08-07 DIAGNOSIS — K598 Other specified functional intestinal disorders: Secondary | ICD-10-CM

## 2013-08-07 DIAGNOSIS — E669 Obesity, unspecified: Secondary | ICD-10-CM | POA: Insufficient documentation

## 2013-08-07 MED ORDER — ALPRAZOLAM 1 MG PO TABS: ORAL_TABLET | ORAL | Status: DC

## 2013-08-07 NOTE — Assessment & Plan Note (Signed)
Reviewed notes, labs, scans that she brought in Copies scanned into chart Time to review medical record approx 15 minutes

## 2013-08-07 NOTE — Progress Notes (Signed)
Subjective:    Patient ID: Judy Gibbs, female    DOB: 07/11/1965, 48 y.o.   MRN: [id number]  HPI  Pt presents to the clinic today for 6 month follow up of chronic medical conditions. She has been following with [facility] hospital re: her motility issues. Besides the chronic pain she is in with that, she is otherwise doing well. She did have a PAC placed and home health that gives her a liter of fluids 5 days per week.  Anxiety and depression: Fairly well controlled on xanax and trazadone. No issues with mood swings. Denies SI/HI. 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 She brought me a packet of labs, procedures to review. She is on Nucynta for pain, marinol, prilosec, phenergan and carafate.  Obesity: She has been doing well. On the marinol so that she does not lose too much weight. Diet is mostly liquid.  Review of Systems      Past Medical History  Diagnosis Date  . Bipolar disorder   . DDD (degenerative disc disease), cervical   . Anxiety   . Depression   . Fibromyalgia   . GERD (gastroesophageal reflux disease)   . IBS (irritable bowel syndrome)   . Raynauds phenomenon   . Gastroparesis   . Colonic inertia   . Proctalgia   . Pelvic floor dysfunction   . Hypoglycemia   . Interstitial cystitis   . Obesity   . Vertigo     Current Outpatient Prescriptions  Medication Sig Dispense Refill  . ALPRAZolam (XANAX) 1 MG tablet TAKE 1 TABLET BY MOUTH TWICE DAILY  60 tablet  0  . calcium-vitamin D (OSCAL) 250-125 MG-UNIT per tablet Take 2 tablets by mouth every morning.       . dronabinol (MARINOL) 10 MG capsule Take 10 mg by mouth as needed.      Marland Kitchen EPINEPHrine (EPI-PEN) 0.3 mg/0.3 mL DEVI Inject 0.3 mLs (0.3 mg total) into the muscle once.  2 Device  1  . Ibuprofen (ADVIL MIGRAINE PO) Take 2 tablets by mouth daily as needed (for migraines).       . Multiple Vitamins-Minerals (ADEKS) chewable tablet Chew 2 tablets by mouth every morning. Multivitamin gummie      .  omeprazole (PRILOSEC) 40 MG capsule Take 40 mg by mouth 2 (two) times daily.      . promethazine (PHENERGAN) 6.25 MG/5ML syrup Take 20 mLs by mouth 4 (four) times daily as needed for nausea.      . sucralfate (CARAFATE) 1 GM/10ML suspension Take 1 g by mouth 2 (two) times daily.      . Tapentadol HCl (NUCYNTA) 75 MG TABS Take 75 mg by mouth every 4 (four) hours as needed (for pain).      . traZODone (DESYREL) 100 MG tablet Take 100 mg by mouth at bedtime.        No current facility-administered medications for this visit.    Allergies  Allergen Reactions  . Celecoxib     REACTION: hives  . Reglan [Metoclopramide] Anxiety    Family History  Problem Relation Age of Onset  . Colon polyps Paternal Uncle   . Esophageal cancer Paternal Uncle   . Diabetes Father   . Heart disease Father   . Stroke Father   . Hyperlipidemia Father   . Hyperlipidemia Mother   . Cancer Paternal Aunt     breast  . Early death Neg Hx     History   Social History  .  Marital Status: Married    Spouse Name: N/A    Number of Children: 2  . Years of Education: N/A   Occupational History  . homemaker    Social History Main Topics  . Smoking status: Former Smoker    Quit date: 05/22/1982  . Smokeless tobacco: Never Used  . Alcohol Use: No  . Drug Use: No  . Sexual Activity: Yes    Birth Control/ Protection: Surgical   Other Topics Concern  . Not on file   Social History Narrative  . No narrative on file     Constitutional: Pt reports fatigue. Denies fever, malaise, headache or abrupt weight changes.  Respiratory: Denies difficulty breathing, shortness of breath, cough or sputum production.   Cardiovascular: Denies chest pain, chest tightness, palpitations or swelling in the hands or feet.  Gastrointestinal: Pt reports abdominal pain, bloating, constipation, nausea and diarrhea (from ileostomy) Denies  blood in the stool.  Neurological: Denies dizziness, difficulty with memory, difficulty  with speech or problems with balance and coordination.   No other specific complaints in a complete review of systems (except as listed in HPI above).  Objective:   Physical Exam  BP 114/82  Pulse 80  Temp(Src) 98.2 F (36.8 C) (Oral)  Wt 188 lb (85.276 kg)  SpO2 99% Wt Readings from Last 3 Encounters:  08/07/13 188 lb (85.276 kg)  03/27/13 173 lb (78.472 kg)  03/24/13 174 lb 1.9 oz (78.98 kg)    General: Appears her stated age, well developed, well nourished in NAD. Cardiovascular: Normal rate and rhythm. S1,S2 noted.  No murmur, rubs or gallops noted. No JVD or BLE edema. No carotid bruits noted. Pulmonary/Chest: Normal effort and positive vesicular breath sounds. No respiratory distress. No wheezes, rales or ronchi noted.  Abdomen: Soft and generally ender. Ileostomy present. Normal bowel sounds, no bruits noted. No distention or masses noted. Liver, spleen and kidneys non palpable. Neurological: Alert and oriented. Cranial nerves II-XII intact. Coordination normal. +DTRs bilaterally. Psychiatric: Mood and affect normal. Behavior is normal. Judgment and thought content normal.     BMET    Component Value Date/Time   NA 142 03/27/2013 1602   K 4.2 03/27/2013 1602   CL 107 03/27/2013 1602   CO2 26 03/27/2013 1602   GLUCOSE 101* 03/27/2013 1602   BUN 6 03/27/2013 1602   CREATININE 0.71 03/27/2013 1602   CALCIUM 9.2 03/27/2013 1602   GFRNONAA >90 03/27/2013 1602   GFRAA >90 03/27/2013 1602    Lipid Panel  No results found for this basename: chol, trig, hdl, cholhdl, vldl, ldlcalc    CBC    Component Value Date/Time   WBC 4.8 03/27/2013 1602   RBC 4.91 03/27/2013 1602   HGB 14.3 03/27/2013 1602   HCT 38.9 03/27/2013 1602   PLT 265 03/27/2013 1602   MCV 79.2 03/27/2013 1602   MCH 29.1 03/27/2013 1602   MCHC 36.8* 03/27/2013 1602   RDW 13.6 03/27/2013 1602   LYMPHSABS 1.6 03/27/2013 1602   MONOABS 0.3 03/27/2013 1602   EOSABS 0.1 03/27/2013 1602   BASOSABS 0.0 03/27/2013 1602     Hgb A1C No results found for this basename: HGBA1C         Assessment & Plan:

## 2013-08-07 NOTE — Assessment & Plan Note (Signed)
Well controlled on trazadone and xanax Will refill xanax today

## 2013-08-07 NOTE — Assessment & Plan Note (Signed)
She is not focused on weight loss at this time d/t her GI disorder Continue Marinol for now

## 2013-08-07 NOTE — Progress Notes (Signed)
Pre visit review using our clinic review tool, if applicable. No additional management support is needed unless otherwise documented below in the visit note. 

## 2013-08-07 NOTE — Patient Instructions (Addendum)

## 2013-08-18 ENCOUNTER — Other Ambulatory Visit: Payer: Self-pay | Admitting: Internal Medicine

## 2013-08-18 ENCOUNTER — Other Ambulatory Visit: Payer: Self-pay

## 2013-08-18 DIAGNOSIS — R69 Illness, unspecified: Secondary | ICD-10-CM

## 2013-08-18 DIAGNOSIS — R4589 Other symptoms and signs involving emotional state: Secondary | ICD-10-CM

## 2013-08-18 NOTE — Telephone Encounter (Signed)
She will need to come in to sign controlled substance agreement and give urine sample for drug screen. I will also have to wait until drug screen results come back before I can prescrive Nucyenta. When is her appt with pain clinic in [place]? I have placed the referral for therapy.

## 2013-08-18 NOTE — Telephone Encounter (Signed)
Pt left v/m; pt has appt at [facility] on 08/[facility]/13 but pt feels too sick to travel and pts daughter was [facility] and cannot leave daughter at home alone; pt request Nicki Reaperegina Baity NP to write rx for Nucynta until pt can get to pain clinic [place]. Pt also request referral to [facility] for therapy to help pt cope with her sickness.Pt request cb.

## 2013-08-19 NOTE — Telephone Encounter (Signed)
I spoke to pt's husband and I let him know that it is imperative for Judy Gibbs to come in to sign the contract and provide a urine sample--I also stated that we will need the results of drug screen before Rx can be given--he states that he will try his best to get her here being that his daughter just had brain surgery--

## 2013-08-25 ENCOUNTER — Other Ambulatory Visit: Payer: Self-pay | Admitting: Internal Medicine

## 2013-08-29 ENCOUNTER — Other Ambulatory Visit: Payer: Self-pay | Admitting: Internal Medicine

## 2013-09-01 ENCOUNTER — Other Ambulatory Visit: Payer: Self-pay | Admitting: Internal Medicine

## 2013-09-01 ENCOUNTER — Encounter: Payer: Self-pay | Admitting: Internal Medicine

## 2013-09-01 NOTE — Telephone Encounter (Signed)
Last OV 08/07/13--please advise

## 2013-09-01 NOTE — Telephone Encounter (Signed)
Ok to phone in, on or after 09/07/13

## 2013-09-05 ENCOUNTER — Other Ambulatory Visit: Payer: Self-pay | Admitting: Internal Medicine

## 2013-09-05 MED ORDER — ALPRAZOLAM 1 MG PO TABS: ORAL_TABLET | ORAL | Status: DC

## 2013-09-05 NOTE — Telephone Encounter (Addendum)
Pt left v/m requesting alprazolam called in to [facility] today. Pt said she has been told she is not requesting too early pt request cb when refilled.

## 2013-09-05 NOTE — Telephone Encounter (Signed)
Rx called into pharmacy and pt is aware. 

## 2013-09-05 NOTE — Telephone Encounter (Signed)
Mel-can you please call this in today?

## 2013-09-10 ENCOUNTER — Ambulatory Visit (INDEPENDENT_AMBULATORY_CARE_PROVIDER_SITE_OTHER): Payer: 59 | Admitting: Psychology

## 2013-09-10 DIAGNOSIS — F332 Major depressive disorder, recurrent severe without psychotic features: Secondary | ICD-10-CM

## 2013-09-11 ENCOUNTER — Ambulatory Visit (INDEPENDENT_AMBULATORY_CARE_PROVIDER_SITE_OTHER): Payer: 59 | Admitting: Internal Medicine

## 2013-09-11 ENCOUNTER — Encounter: Payer: Self-pay | Admitting: Internal Medicine

## 2013-09-11 ENCOUNTER — Encounter: Payer: Self-pay | Admitting: Radiology

## 2013-09-11 VITALS — BP 106/64 | HR 82 | Temp 98.1°F | Wt 191.0 lb

## 2013-09-11 DIAGNOSIS — M5030 Other cervical disc degeneration, unspecified cervical region: Secondary | ICD-10-CM

## 2013-09-11 DIAGNOSIS — F32A Depression, unspecified: Secondary | ICD-10-CM

## 2013-09-11 DIAGNOSIS — K598 Other specified functional intestinal disorders: Secondary | ICD-10-CM

## 2013-09-11 DIAGNOSIS — K599 Functional intestinal disorder, unspecified: Secondary | ICD-10-CM

## 2013-09-11 DIAGNOSIS — F3289 Other specified depressive episodes: Secondary | ICD-10-CM

## 2013-09-11 DIAGNOSIS — F341 Dysthymic disorder: Secondary | ICD-10-CM

## 2013-09-11 DIAGNOSIS — K5989 Other specified functional intestinal disorders: Secondary | ICD-10-CM

## 2013-09-11 DIAGNOSIS — F329 Major depressive disorder, single episode, unspecified: Secondary | ICD-10-CM

## 2013-09-11 DIAGNOSIS — F419 Anxiety disorder, unspecified: Secondary | ICD-10-CM

## 2013-09-11 MED ORDER — BUPROPION HCL ER (XL) 150 MG PO TB24: 150.0000 mg | ORAL_TABLET | Freq: Every day | ORAL | Status: DC

## 2013-09-11 MED ORDER — TAPENTADOL HCL 75 MG PO TABS: 75.0000 mg | ORAL_TABLET | ORAL | Status: DC | PRN

## 2013-09-11 NOTE — Progress Notes (Signed)
Pre visit review using our clinic review tool, if applicable. No additional management support is needed unless otherwise documented below in the visit note. 

## 2013-09-11 NOTE — Assessment & Plan Note (Signed)
Will take over pain management She will sigh a CSA today RX for Nucynta given

## 2013-09-11 NOTE — Assessment & Plan Note (Signed)
Seems severe right now, situational Will start wellbutrin in addition to xanax and trazadone Encouraged her to talk with someone, she declines at this time  RTC in 1 month to reevaluate

## 2013-09-11 NOTE — Progress Notes (Signed)
Subjective:    Patient ID: Judy FlockSheila K Armetta, female    DOB: [birth year], [age] y.o.   MRN: [id number]  HPI  Pt presents to the clinic today to discuss having her pain management switched to our practice from the practice she is currently at. She is on Nucynta 50 mg for cervicall DDD, fibromyalgia and colonic inertia. The nucynta has worked wonders for her. She wants to switch here because [facility] is an hour away from her house. She is willing to set up CSA with [facility].  She is also having issues with depression. She is on xanax and trazadone. She was treated for depression like 8 years ago with lamictal, an antipsycotic and prozac. She reports the depression is really bad right now because her daughter recently had a shunt revision that did not go well. She does have a history of SI but nothing recently. She has done some research and would like to try wellbutrin.  Review of Systems      Past Medical History  Diagnosis Date  . Bipolar disorder   . DDD (degenerative disc disease), cervical   . Anxiety   . Depression   . Fibromyalgia   . GERD (gastroesophageal reflux disease)   . IBS (irritable bowel syndrome)   . Raynauds phenomenon   . Gastroparesis   . Colonic inertia   . Proctalgia   . Pelvic floor dysfunction   . Hypoglycemia   . Interstitial cystitis   . Obesity   . Vertigo     Current Outpatient Prescriptions  Medication Sig Dispense Refill  . ALPRAZolam (XANAX) 1 MG tablet TAKE 1 TABLET BY MOUTH TWICE DAILY  60 tablet  0  . calcium-vitamin D (OSCAL) 250-125 MG-UNIT per tablet Take 2 tablets by mouth every morning.       . dronabinol (MARINOL) 10 MG capsule Take 10 mg by mouth as needed.      Marland Kitchen. EPINEPHrine (EPI-PEN) 0.3 mg/0.3 mL DEVI Inject 0.3 mLs (0.3 mg total) into the muscle once.  2 Device  1  . Ibuprofen (ADVIL MIGRAINE PO) Take 2 tablets by mouth daily as needed (for migraines).       . Multiple Vitamins-Minerals (ADEKS) chewable tablet  Chew 2 tablets by mouth every morning. Multivitamin gummie      . omeprazole (PRILOSEC) 40 MG capsule Take 40 mg by mouth 2 (two) times daily.      . promethazine (PHENERGAN) 6.25 MG/5ML syrup Take 20 mLs by mouth 4 (four) times daily as needed for nausea.      . sucralfate (CARAFATE) 1 GM/10ML suspension Take 1 g by mouth 2 (two) times daily.      . Tapentadol HCl (NUCYNTA) 75 MG TABS Take 75 mg by mouth every 4 (four) hours as needed (for pain).      . traZODone (DESYREL) 100 MG tablet Take 100 mg by mouth at bedtime.        No current facility-administered medications for this visit.    Allergies  Allergen Reactions  . Celecoxib     REACTION: hives  . Reglan [Metoclopramide] Anxiety    Family History  Problem Relation Age of Onset  . Colon polyps Paternal Uncle   . Esophageal cancer Paternal Uncle   . Diabetes Father   . Heart disease Father   . Stroke Father   . Hyperlipidemia Father   . Hyperlipidemia Mother   . Cancer Paternal Aunt     breast  . Early death Neg Hx  History   Social History  . Marital Status: Married    Spouse Name: N/A    Number of Children: 2  . Years of Education: N/A   Occupational History  . homemaker    Social History Main Topics  . Smoking status: Former Smoker    Quit date: 05/22/1982  . Smokeless tobacco: Never Used  . Alcohol Use: No  . Drug Use: No  . Sexual Activity: Yes    Birth Control/ Protection: Surgical   Other Topics Concern  . Not on file   Social History Narrative  . No narrative on file     Constitutional: Denies fever, malaise, fatigue, headache or abrupt weight changes.  Gastrointestinal: Pt reports chronic abdominal pain and constipation. Denies  diarrhea or blood in the stool.  Musculoskeletal: Pt reports chronic neck pain. Denies difficulty with gait, muscle pain or joint pain and swelling.  Psych: Pt reports anxiety and depression. Denies SI/HI.  No other specific complaints in a complete review of  systems (except as listed in HPI above).  Objective:   Physical Exam   BP 106/64  Pulse 82  Temp(Src) 98.1 F (36.7 C) (Oral)  Wt 191 lb (86.637 kg)  SpO2 98% Wt Readings from Last 3 Encounters:  09/11/13 191 lb (86.637 kg)  08/07/13 188 lb (85.276 kg)  03/27/13 173 lb (78.472 kg)    General: Appears her stated age, chronically ill appearing in NAD. Neck: Decreased flexion, extension and rotation.  Cardiovascular: Normal rate and rhythm. S1,S2 noted.  No murmur, rubs or gallops noted. No JVD or BLE edema. No carotid bruits noted. Pulmonary/Chest: Normal effort and positive vesicular breath sounds. No respiratory distress. No wheezes, rales or ronchi noted.  Abdomen: Soft and generally tender. Normal bowel sounds, no bruits noted. No masses noted Psychiatric: Mood tearful and affect flat. Behavior is normal. Judgment and thought content normal.     BMET    Component Value Date/Time   NA 142 03/27/2013 1602   K 4.2 03/27/2013 1602   CL 107 03/27/2013 1602   CO2 26 03/27/2013 1602   GLUCOSE 101* 03/27/2013 1602   BUN 6 03/27/2013 1602   CREATININE 0.71 03/27/2013 1602   CALCIUM 9.2 03/27/2013 1602   GFRNONAA >90 03/27/2013 1602   GFRAA >90 03/27/2013 1602    Lipid Panel  No results found for this basename: chol, trig, hdl, cholhdl, vldl, ldlcalc    CBC    Component Value Date/Time   WBC 4.8 03/27/2013 1602   RBC 4.91 03/27/2013 1602   HGB 14.3 03/27/2013 1602   HCT 38.9 03/27/2013 1602   PLT 265 03/27/2013 1602   MCV 79.2 03/27/2013 1602   MCH 29.1 03/27/2013 1602   MCHC 36.8* 03/27/2013 1602   RDW 13.6 03/27/2013 1602   LYMPHSABS 1.6 03/27/2013 1602   MONOABS 0.3 03/27/2013 1602   EOSABS 0.1 03/27/2013 1602   BASOSABS 0.0 03/27/2013 1602    Hgb A1C No results found for this basename: HGBA1C        Assessment & Plan:

## 2013-09-11 NOTE — Patient Instructions (Addendum)

## 2013-09-12 ENCOUNTER — Encounter: Payer: Self-pay | Admitting: Internal Medicine

## 2013-09-15 ENCOUNTER — Ambulatory Visit: Payer: 59 | Admitting: Internal Medicine

## 2013-09-17 ENCOUNTER — Ambulatory Visit: Payer: 59 | Admitting: Psychology

## 2013-09-24 ENCOUNTER — Encounter: Payer: Self-pay | Admitting: Internal Medicine

## 2013-09-25 ENCOUNTER — Ambulatory Visit: Payer: 59 | Admitting: Psychology

## 2013-10-06 ENCOUNTER — Encounter: Payer: Self-pay | Admitting: Internal Medicine

## 2013-10-07 ENCOUNTER — Encounter: Payer: Self-pay | Admitting: Internal Medicine

## 2013-10-08 ENCOUNTER — Encounter: Payer: Self-pay | Admitting: Internal Medicine

## 2013-10-09 ENCOUNTER — Encounter: Payer: Self-pay | Admitting: Internal Medicine

## 2013-10-10 ENCOUNTER — Encounter: Payer: Self-pay | Admitting: Internal Medicine

## 2013-10-10 ENCOUNTER — Ambulatory Visit (INDEPENDENT_AMBULATORY_CARE_PROVIDER_SITE_OTHER): Payer: 59 | Admitting: Internal Medicine

## 2013-10-10 VITALS — BP 112/72 | HR 87 | Temp 98.0°F | Wt 187.0 lb

## 2013-10-10 DIAGNOSIS — R142 Eructation: Secondary | ICD-10-CM

## 2013-10-10 DIAGNOSIS — K5989 Other specified functional intestinal disorders: Secondary | ICD-10-CM

## 2013-10-10 DIAGNOSIS — M5030 Other cervical disc degeneration, unspecified cervical region: Secondary | ICD-10-CM

## 2013-10-10 DIAGNOSIS — R141 Gas pain: Secondary | ICD-10-CM

## 2013-10-10 DIAGNOSIS — R143 Flatulence: Secondary | ICD-10-CM

## 2013-10-10 DIAGNOSIS — K598 Other specified functional intestinal disorders: Secondary | ICD-10-CM

## 2013-10-10 DIAGNOSIS — K599 Functional intestinal disorder, unspecified: Secondary | ICD-10-CM

## 2013-10-10 DIAGNOSIS — R140 Abdominal distension (gaseous): Secondary | ICD-10-CM

## 2013-10-10 DIAGNOSIS — R300 Dysuria: Secondary | ICD-10-CM

## 2013-10-10 LAB — POCT URINALYSIS DIPSTICK
Blood, UA: NEGATIVE
Glucose, UA: NEGATIVE
Ketones, UA: NEGATIVE
LEUKOCYTES UA: NEGATIVE
NITRITE UA: NEGATIVE
SPEC GRAV UA: 1.02
UROBILINOGEN UA: 0.2
pH, UA: 6

## 2013-10-10 MED ORDER — TAPENTADOL HCL 75 MG PO TABS: 75.0000 mg | ORAL_TABLET | ORAL | Status: DC | PRN

## 2013-10-10 NOTE — Progress Notes (Signed)
Subjective:    Patient ID: Judy Gibbs, female    DOB: [birth year], 48 y.o.   MRN: [id number]  HPI  Pt presents to the clinic today to discuss multiple issues.  1- She is having trouble with her stoma. She notes that it has been getting smaller and constricting. She has noticed some fluid coming from around the stoma and not out of the stoma itself. 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   2- She c/o poor appetite. She reports she can get 1-2 ensures in per day but not able to eat anything else. When she does eat, she feels very bloated. The extreme bloating causes her a lot of pain  3- She needs a refill of her Nucynta today.  4- Additionally, she thinks she may have a UTI. She is experiencing some discomfort with urination. She denies fevers but has had abdominal pain and nausea (those are chronic issues for her). She has not tried anything OTC. Review of Systems      Past Medical History  Diagnosis Date  . Bipolar disorder   . DDD (degenerative disc disease), cervical   . Anxiety   . Depression   . Fibromyalgia   . GERD (gastroesophageal reflux disease)   . IBS (irritable bowel syndrome)   . Raynauds phenomenon   . Gastroparesis   . Colonic inertia   . Proctalgia   . Pelvic floor dysfunction   . Hypoglycemia   . Interstitial cystitis   . Obesity   . Vertigo     Current Outpatient Prescriptions  Medication Sig Dispense Refill  . ALPRAZolam (XANAX) 1 MG tablet TAKE 1 TABLET BY MOUTH TWICE DAILY  60 tablet  0  . buPROPion (WELLBUTRIN XL) 150 MG 24 hr tablet Take 1 tablet (150 mg total) by mouth daily.  30 tablet  2  . calcium-vitamin D (OSCAL) 250-125 MG-UNIT per tablet Take 2 tablets by mouth every morning.       . dronabinol (MARINOL) 10 MG capsule Take 10 mg by mouth as needed.      Marland Kitchen EPINEPHrine (EPI-PEN) 0.3 mg/0.3 mL DEVI Inject 0.3 mLs (0.3 mg total) into the muscle once.  2 Device  1  . Ibuprofen  (ADVIL MIGRAINE PO) Take 2 tablets by mouth daily as needed (for migraines).       . Multiple Vitamins-Minerals (ADEKS) chewable tablet Chew 2 tablets by mouth every morning. Multivitamin gummie      . omeprazole (PRILOSEC) 40 MG capsule Take 40 mg by mouth 2 (two) times daily.      . promethazine (PHENERGAN) 6.25 MG/5ML syrup Take 20 mLs by mouth 4 (four) times daily as needed for nausea.      . sucralfate (CARAFATE) 1 GM/10ML suspension Take 1 g by mouth 2 (two) times daily.      . Tapentadol HCl (NUCYNTA) 75 MG TABS Take 1 tablet (75 mg total) by mouth every 4 (four) hours as needed (for pain).  120 each  0  . traZODone (DESYREL) 100 MG tablet Take 100 mg by mouth at bedtime.        No current facility-administered medications for this visit.    Allergies  Allergen Reactions  . Celecoxib     REACTION: hives  . Reglan [Metoclopramide] Anxiety    Family History  Problem Relation Age of Onset  . Colon polyps Paternal Uncle   . Esophageal cancer Paternal Uncle   .  Diabetes Father   . Heart disease Father   . Stroke Father   . Hyperlipidemia Father   . Hyperlipidemia Mother   . Cancer Paternal Aunt     breast  . Early death Neg Hx     History   Social History  . Marital Status: Married    Spouse Name: N/A    Number of Children: 2  . Years of Education: N/A   Occupational History  . homemaker    Social History Main Topics  . Smoking status: Former Smoker    Quit date: 05/22/1982  . Smokeless tobacco: Never Used  . Alcohol Use: No  . Drug Use: No  . Sexual Activity: Yes    Birth Control/ Protection: Surgical   Other Topics Concern  . Not on file   Social History Narrative  . No narrative on file     Constitutional: Denies fever, malaise, fatigue, headache or abrupt weight changes.  Gastrointestinal: Pt reports poor appetite, nausea, bloating and abdominal pain. Denies constipation, diarrhea or blood in the stool.  GU: Pt reports dysuria. Denies urgency,  frequency,  burning sensation, blood in urine, odor or discharge.   No other specific complaints in a complete review of systems (except as listed in HPI above).  Objective:   Physical Exam  BP 112/72  Pulse 87  Temp(Src) 98 F (36.7 C) (Oral)  Wt 187 lb (84.823 kg)  SpO2 99% Wt Readings from Last 3 Encounters:  10/10/13 187 lb (84.823 kg)  09/11/13 191 lb (86.637 kg)  08/07/13 188 lb (85.276 kg)    General: Appears her stated age, well developed, well nourished in NAD. Cardiovascular: Normal rate and rhythm. S1,S2 noted.  No murmur, rubs or gallops noted. No JVD or BLE edema. No carotid bruits noted. Pulmonary/Chest: Normal effort and positive vesicular breath sounds. No respiratory distress. No wheezes, rales or ronchi noted.  Abdomen: Soft and tender in th RLQ. Normal bowel sounds, no bruits noted. No masses noted. Mild distention noted in the RLQ.  Liver, spleen and kidneys non palpable.    BMET    Component Value Date/Time   NA 142 03/27/2013 1602   K 4.2 03/27/2013 1602   CL 107 03/27/2013 1602   CO2 26 03/27/2013 1602   GLUCOSE 101* 03/27/2013 1602   BUN 6 03/27/2013 1602   CREATININE 0.71 03/27/2013 1602   CALCIUM 9.2 03/27/2013 1602   GFRNONAA >90 03/27/2013 1602   GFRAA >90 03/27/2013 1602    Lipid Panel  No results found for this basename: chol, trig, hdl, cholhdl, vldl, ldlcalc    CBC    Component Value Date/Time   WBC 4.8 03/27/2013 1602   RBC 4.91 03/27/2013 1602   HGB 14.3 03/27/2013 1602   HCT 38.9 03/27/2013 1602   PLT 265 03/27/2013 1602   MCV 79.2 03/27/2013 1602   MCH 29.1 03/27/2013 1602   MCHC 36.8* 03/27/2013 1602   RDW 13.6 03/27/2013 1602   LYMPHSABS 1.6 03/27/2013 1602   MONOABS 0.3 03/27/2013 1602   EOSABS 0.1 03/27/2013 1602   BASOSABS 0.0 03/27/2013 1602    Hgb A1C No results found for this basename: HGBA1C         Assessment & Plan:   Colonic inertia, nausea, bloating and abdominal pain:  Will refer her to [facility]  Dysuria:  Urinalysis: moderate bilirubin.  No indication for antibiotics at this time.  RTC as needed

## 2013-10-10 NOTE — Addendum Note (Signed)
Addended by: Roena Malady on: 10/10/2013 03:41 PM   Modules accepted: Orders

## 2013-10-10 NOTE — Patient Instructions (Addendum)
Nausea, Adult Nausea is the feeling that you have an upset stomach or have to vomit. Nausea by itself is not likely a serious concern, but it may be an early sign of more serious medical problems. As nausea gets worse, it can lead to vomiting. If vomiting develops, there is the risk of dehydration.  CAUSES   Viral infections.  Food poisoning.  Medicines.  Pregnancy.  Motion sickness.  Migraine headaches.  Emotional distress.  Severe pain from any source.  Alcohol intoxication. HOME CARE INSTRUCTIONS  Get plenty of rest.  Ask your caregiver about specific rehydration instructions.  Eat small amounts of food and sip liquids more often.  Take all medicines as told by your caregiver. SEEK MEDICAL CARE IF:  You have not improved after 2 days, or you get worse.  You have a headache. SEEK IMMEDIATE MEDICAL CARE IF:   You have a fever.  You faint.  You keep vomiting or have blood in your vomit.  You are extremely weak or dehydrated.  You have dark or bloody stools.  You have severe chest or abdominal pain. MAKE SURE YOU:  Understand these instructions.  Will watch your condition.  Will get help right away if you are not doing well or get worse. Document Released: 06/15/2004 Document Revised: 01/31/2012 Document Reviewed: 01/18/2011 Exit[employer] Patient Information 2014 [employer].  

## 2013-10-10 NOTE — Progress Notes (Signed)
Pre visit review using our clinic review tool, if applicable. No additional management support is needed unless otherwise documented below in the visit note. 

## 2013-10-14 ENCOUNTER — Encounter: Payer: Self-pay | Admitting: Internal Medicine

## 2013-10-15 ENCOUNTER — Encounter: Payer: Self-pay | Admitting: Internal Medicine

## 2013-10-27 ENCOUNTER — Encounter: Payer: Self-pay | Admitting: Internal Medicine

## 2013-10-27 ENCOUNTER — Other Ambulatory Visit: Payer: Self-pay

## 2013-10-27 MED ORDER — TRAZODONE HCL 100 MG PO TABS: 100.0000 mg | ORAL_TABLET | Freq: Every day | ORAL | Status: DC

## 2013-10-30 ENCOUNTER — Ambulatory Visit (INDEPENDENT_AMBULATORY_CARE_PROVIDER_SITE_OTHER): Payer: 59 | Admitting: Psychology

## 2013-10-30 DIAGNOSIS — F332 Major depressive disorder, recurrent severe without psychotic features: Secondary | ICD-10-CM

## 2013-11-03 ENCOUNTER — Encounter: Payer: Self-pay | Admitting: Internal Medicine

## 2013-11-06 ENCOUNTER — Other Ambulatory Visit: Payer: Self-pay | Admitting: Internal Medicine

## 2013-11-06 NOTE — Telephone Encounter (Signed)
Last filled 09/05/13--please advise

## 2013-11-07 NOTE — Telephone Encounter (Signed)
Ok to phone in xanax 

## 2013-11-10 ENCOUNTER — Encounter: Payer: 59 | Admitting: Internal Medicine

## 2013-11-10 ENCOUNTER — Encounter: Payer: Self-pay | Admitting: Internal Medicine

## 2013-11-10 ENCOUNTER — Telehealth: Payer: Self-pay

## 2013-11-10 ENCOUNTER — Other Ambulatory Visit: Payer: Self-pay | Admitting: Internal Medicine

## 2013-11-10 DIAGNOSIS — K599 Functional intestinal disorder, unspecified: Secondary | ICD-10-CM

## 2013-11-10 DIAGNOSIS — M5030 Other cervical disc degeneration, unspecified cervical region: Secondary | ICD-10-CM

## 2013-11-10 MED ORDER — TAPENTADOL HCL 75 MG PO TABS: 75.0000 mg | ORAL_TABLET | ORAL | Status: DC | PRN

## 2013-11-10 NOTE — Telephone Encounter (Signed)
Rx called in to pharmacy. 

## 2013-11-10 NOTE — Telephone Encounter (Signed)
Rx quantity to #180 as you directed and printed for your signature

## 2013-11-11 NOTE — Progress Notes (Signed)
error 

## 2013-11-16 ENCOUNTER — Encounter: Payer: Self-pay | Admitting: Internal Medicine

## 2013-11-17 ENCOUNTER — Other Ambulatory Visit: Payer: Self-pay | Admitting: Internal Medicine

## 2013-11-17 ENCOUNTER — Encounter: Payer: Self-pay | Admitting: Internal Medicine

## 2013-11-17 MED ORDER — CYCLOBENZAPRINE HCL 10 MG PO TABS: 10.0000 mg | ORAL_TABLET | Freq: Three times a day (TID) | ORAL | Status: DC | PRN

## 2013-11-18 ENCOUNTER — Telehealth: Payer: Self-pay | Admitting: Internal Medicine

## 2013-11-18 NOTE — Telephone Encounter (Signed)
Patient Information:  Caller Name: Judy Gibbs  Phone: [phone]  Patient: Judy Gibbs, Judy Gibbs  Gender: Female  DOB: Jul 13, 1965  Age: 48 Years  PCP: Nicki ReaperBaity, Regina  Pregnant: No  Office Follow Up:  Does the office need to follow up with this patient?: No  Instructions For The Office: N/A   Symptoms  Reason For Call & Symptoms: Pts husband called to see if pt should be seen. RN checked [facility] Epic/pt was gardening and pulled some rib muscles on 11/16/13. She called after hours nurse and then [employer] pt has taken without much relief. RN checked note in Epic which said if no better call for an appt. RN offered appt/pt declined. Told her husband "she didn't need to come in /she was feeling much better".  Reviewed Health History In EMR: Yes  Reviewed Medications In EMR: Yes  Reviewed Allergies In EMR: Yes  Reviewed Surgeries / Procedures: Yes  Date of Onset of Symptoms: 11/17/2013 OB / GYN:  LMP: Unknown  Guideline(s) Used:  No Protocol Available - Information Only  Disposition Per Guideline:   Home Care  Reason For Disposition Reached:   Information only question and nurse able to answer  Advice Given:  Call Back If:  New symptoms develop  You become worse.  Patient Will Follow Care Advice:  YES

## 2013-11-24 ENCOUNTER — Telehealth: Payer: Self-pay

## 2013-11-24 NOTE — Telephone Encounter (Signed)
Ciara nurse with [facility] left v/m; Ciara saw pt today and pt complained with generalized weakness and poor balance at times; Ciara request home health PT order to have evaluation of pt.Ciara request cb.

## 2013-11-24 NOTE — Telephone Encounter (Signed)
Okay to have PT do evaluation

## 2013-11-25 NOTE — Telephone Encounter (Signed)
Spoke to PT and gave the verbal order for eval

## 2013-11-28 ENCOUNTER — Ambulatory Visit (INDEPENDENT_AMBULATORY_CARE_PROVIDER_SITE_OTHER): Payer: 59 | Admitting: Psychology

## 2013-11-28 DIAGNOSIS — F332 Major depressive disorder, recurrent severe without psychotic features: Secondary | ICD-10-CM

## 2013-12-02 ENCOUNTER — Telehealth: Payer: Self-pay

## 2013-12-02 ENCOUNTER — Encounter: Payer: Self-pay | Admitting: Internal Medicine

## 2013-12-02 NOTE — Telephone Encounter (Signed)
Weston BrassNick PT with [facility] left v/m requesting verbal order for home health PT recertification for extended 60 days; also see pt 1 x a week for 1 week and 2 x a week for 2 weeks beginning 12/04/13 - 12/20/13.Please advise.

## 2013-12-03 NOTE — Telephone Encounter (Signed)
Ok to continue PT as described

## 2013-12-03 NOTE — Telephone Encounter (Signed)
lmovm for pt to return my call 

## 2013-12-03 NOTE — Telephone Encounter (Signed)
Spoke with Weston Brassick and he was given verbal order okay per NVR Incegina Baity

## 2013-12-03 NOTE — Telephone Encounter (Signed)
lmovm for Weston Brassick to return my call

## 2013-12-04 ENCOUNTER — Emergency Department (HOSPITAL_COMMUNITY): Payer: 59

## 2013-12-04 ENCOUNTER — Encounter (HOSPITAL_COMMUNITY): Payer: Self-pay | Admitting: Emergency Medicine

## 2013-12-04 ENCOUNTER — Emergency Department (HOSPITAL_COMMUNITY)
Admission: EM | Admit: 2013-12-04 | Discharge: 2013-12-04 | Disposition: A | Discharge status: Home / Self Care | Payer: 59 | Attending: Emergency Medicine | Admitting: Emergency Medicine

## 2013-12-04 DIAGNOSIS — Z8679 Personal history of other diseases of the circulatory system: Secondary | ICD-10-CM | POA: Insufficient documentation

## 2013-12-04 DIAGNOSIS — Z8639 Personal history of other endocrine, nutritional and metabolic disease: Secondary | ICD-10-CM | POA: Insufficient documentation

## 2013-12-04 DIAGNOSIS — R202 Paresthesia of skin: Secondary | ICD-10-CM

## 2013-12-04 DIAGNOSIS — Z79899 Other long term (current) drug therapy: Secondary | ICD-10-CM | POA: Insufficient documentation

## 2013-12-04 DIAGNOSIS — Z87891 Personal history of nicotine dependence: Secondary | ICD-10-CM | POA: Insufficient documentation

## 2013-12-04 DIAGNOSIS — R5383 Other fatigue: Secondary | ICD-10-CM

## 2013-12-04 DIAGNOSIS — R5381 Other malaise: Secondary | ICD-10-CM | POA: Insufficient documentation

## 2013-12-04 DIAGNOSIS — Z8739 Personal history of other diseases of the musculoskeletal system and connective tissue: Secondary | ICD-10-CM | POA: Insufficient documentation

## 2013-12-04 DIAGNOSIS — Z8742 Personal history of other diseases of the female genital tract: Secondary | ICD-10-CM | POA: Insufficient documentation

## 2013-12-04 DIAGNOSIS — R531 Weakness: Secondary | ICD-10-CM

## 2013-12-04 DIAGNOSIS — F411 Generalized anxiety disorder: Secondary | ICD-10-CM | POA: Insufficient documentation

## 2013-12-04 DIAGNOSIS — R008 Other abnormalities of heart beat: Secondary | ICD-10-CM

## 2013-12-04 DIAGNOSIS — R209 Unspecified disturbances of skin sensation: Secondary | ICD-10-CM | POA: Insufficient documentation

## 2013-12-04 DIAGNOSIS — IMO0001 Reserved for inherently not codable concepts without codable children: Secondary | ICD-10-CM | POA: Insufficient documentation

## 2013-12-04 DIAGNOSIS — E669 Obesity, unspecified: Secondary | ICD-10-CM | POA: Insufficient documentation

## 2013-12-04 DIAGNOSIS — F319 Bipolar disorder, unspecified: Secondary | ICD-10-CM | POA: Insufficient documentation

## 2013-12-04 DIAGNOSIS — Z862 Personal history of diseases of the blood and blood-forming organs and certain disorders involving the immune mechanism: Secondary | ICD-10-CM | POA: Insufficient documentation

## 2013-12-04 LAB — DIFFERENTIAL
BASOS ABS: 0 10*3/uL (ref 0.0–0.1)
Basophils Relative: 1 % (ref 0–1)
Eosinophils Absolute: 0.1 10*3/uL (ref 0.0–0.7)
Eosinophils Relative: 1 % (ref 0–5)
LYMPHS PCT: 27 % (ref 12–46)
Lymphs Abs: 1.4 10*3/uL (ref 0.7–4.0)
MONO ABS: 0.5 10*3/uL (ref 0.1–1.0)
Monocytes Relative: 10 % (ref 3–12)
NEUTROS ABS: 3.3 10*3/uL (ref 1.7–7.7)
Neutrophils Relative %: 61 % (ref 43–77)

## 2013-12-04 LAB — COMPREHENSIVE METABOLIC PANEL
ALT: 12 U/L (ref 0–35)
AST: 21 U/L (ref 0–37)
Albumin: 3.7 g/dL (ref 3.5–5.2)
Alkaline Phosphatase: 105 U/L (ref 39–117)
Anion gap: 15 (ref 5–15)
BILIRUBIN TOTAL: 0.3 mg/dL (ref 0.3–1.2)
BUN: 7 mg/dL (ref 6–23)
CHLORIDE: 102 meq/L (ref 96–112)
CO2: 24 mEq/L (ref 19–32)
Calcium: 9 mg/dL (ref 8.4–10.5)
Creatinine, Ser: 0.63 mg/dL (ref 0.50–1.10)
GLUCOSE: 95 mg/dL (ref 70–99)
Potassium: 3.9 mEq/L (ref 3.7–5.3)
SODIUM: 141 meq/L (ref 137–147)
Total Protein: 7 g/dL (ref 6.0–8.3)

## 2013-12-04 LAB — I-STAT CHEM 8, ED
BUN: 4 mg/dL — ABNORMAL LOW (ref 6–23)
CHLORIDE: 102 meq/L (ref 96–112)
CREATININE: 0.6 mg/dL (ref 0.50–1.10)
Calcium, Ion: 1.13 mmol/L (ref 1.12–1.23)
GLUCOSE: 96 mg/dL (ref 70–99)
HCT: 40 % (ref 36.0–46.0)
HEMOGLOBIN: 13.6 g/dL (ref 12.0–15.0)
POTASSIUM: 3.2 meq/L — AB (ref 3.7–5.3)
Sodium: 142 mEq/L (ref 137–147)
TCO2: 23 mmol/L (ref 0–100)

## 2013-12-04 LAB — URINALYSIS, ROUTINE W REFLEX MICROSCOPIC
Bilirubin Urine: NEGATIVE
Glucose, UA: NEGATIVE mg/dL
Ketones, ur: 15 mg/dL — AB
NITRITE: NEGATIVE
Protein, ur: NEGATIVE mg/dL
Specific Gravity, Urine: 1.009 (ref 1.005–1.030)
UROBILINOGEN UA: 0.2 mg/dL (ref 0.0–1.0)
pH: 7.5 (ref 5.0–8.0)

## 2013-12-04 LAB — CBC
HEMATOCRIT: 37.3 % (ref 36.0–46.0)
Hemoglobin: 13.3 g/dL (ref 12.0–15.0)
MCH: 27.2 pg (ref 26.0–34.0)
MCHC: 35.7 g/dL (ref 30.0–36.0)
MCV: 76.3 fL — AB (ref 78.0–100.0)
PLATELETS: 290 10*3/uL (ref 150–400)
RBC: 4.89 MIL/uL (ref 3.87–5.11)
RDW: 14.6 % (ref 11.5–15.5)
WBC: 5.4 10*3/uL (ref 4.0–10.5)

## 2013-12-04 LAB — RAPID URINE DRUG SCREEN, HOSP PERFORMED
Amphetamines: NOT DETECTED
Barbiturates: NOT DETECTED
Benzodiazepines: NOT DETECTED
Cocaine: NOT DETECTED
Opiates: NOT DETECTED
TETRAHYDROCANNABINOL: NOT DETECTED

## 2013-12-04 LAB — ETHANOL: Alcohol, Ethyl (B): <11 mg/dL (ref 0–11)

## 2013-12-04 LAB — I-STAT TROPONIN, ED: Troponin i, poc: 0 ng/mL (ref 0.00–0.08)

## 2013-12-04 LAB — URINE MICROSCOPIC-ADD ON

## 2013-12-04 LAB — CBG MONITORING, ED: Glucose-Capillary: 91 mg/dL (ref 70–99)

## 2013-12-04 MED ORDER — SODIUM CHLORIDE 0.9 % IJ SOLN: 10.0000 mL | INTRAMUSCULAR | Status: DC | PRN

## 2013-12-04 MED ORDER — SODIUM CHLORIDE 0.9 % IV BOLUS (SEPSIS)
1000.0000 mL | Freq: Once | INTRAVENOUS | Status: AC
  Administered 2013-12-04: 1000 mL via INTRAVENOUS

## 2013-12-04 MED ORDER — ONDANSETRON HCL 4 MG/2ML IJ SOLN: 4.0000 mg | Freq: Once | INTRAMUSCULAR | Status: DC

## 2013-12-04 MED ORDER — POTASSIUM CHLORIDE 20 MEQ/15ML (10%) PO LIQD
40.0000 meq | Freq: Once | ORAL | Status: AC
Start: 2013-12-04 — End: 2013-12-04
  Administered 2013-12-04: 40 meq via ORAL
  Filled 2013-12-04: qty 30

## 2013-12-04 NOTE — ED Provider Notes (Signed)
CSN: [id number]     Arrival date & time 12/04/13  1021 History   First MD Initiated Contact with Patient 12/04/13 1027     Chief Complaint  Patient presents with  . Numbness     (Consider location/radiation/quality/duration/timing/severity/associated sxs/prior Treatment) HPI  48 year old female with a past medical history of bipolar disorder, fibromyalgia, depression and anxiety, irritable bowel syndrome, Raynaud's, gastroparesis, proctalgia fugax, interstitial cystitis present to the emergency department chief complaint of weakness and paresthesia. The patient states she has had worsening weakness for the past f dates. She states that she had her port accessed changed Monday and was running a low grade fever of 99.3 the past few days. She states that her weakness has been excessive over the past few days. She has had worsening decreased by mouth intake she also complains of decreased output in her ileostomy bag. Last night the patient was experiencing metallic taste in her mouth. She states that she had an episode of presyncope the patient felt that she may be having hypoglycemia and drink a glass of apple juice with added sugar the paresthesias began just after ingestion. She complains of paresthesia in the left face, left arm and left leg. The patient took her blood sugar and it was 91 states "I must have had low blood sugars but to only rise to 91 and after drinking apple Juice." The patient's charts from [facility] which is followed by family medicine, neurology, and gastroenterology she is a previous history of these same complaints. The patient has had multiple workups for paresthesias in the upper and lower extremities, she's been seen at [facility] for this autonomic dysfunction with negative testing. Patient has also had nerve conduction studies which are negative. The patient is scheduled for her upper GI series tomorrow at [facility] 2 to area of possible constriction near her  ileostomy site which was seen on CT several days ago . Patient has been this with her and states that her weakness has become increasingly worse since yesterday. He states that she has about leg weakness and he essentially had her into the car today. Patient is concerned because her by mouth intake has been so poor.  Patient has no active fevers, urinary symptoms, vomiting. She denies any change in her stools. She does have chronic abdominal pain and nausea. She denies difficulty with speech, change in vision, headaches, unilateral weakness, vertigo.  Denies fevers, chills, myalgias, arthralgias. Denies DOE, SOB, chest tightness or pressure, radiation to left arm, jaw or back, or diaphoresis.     Past Medical History  Diagnosis Date  . Bipolar disorder   . DDD (degenerative disc disease), cervical   . Anxiety   . Depression   . Fibromyalgia   . GERD (gastroesophageal reflux disease)   . IBS (irritable bowel syndrome)   . Raynauds phenomenon   . Gastroparesis   . Colonic inertia   . Proctalgia   . Pelvic floor dysfunction   . Hypoglycemia   . Interstitial cystitis   . Obesity   . Vertigo    Past Surgical History  Procedure Laterality Date  . Cholecystectomy    . Hysterectomy - unknown type    . Total knee arthroplasty  88  & 10    R & L  . Colonoscopy  08/08/2001    normal, Dr. Lina Sar  . Upper gastrointestinal endoscopy  06/15/2006    hiatal hernia, gastritis  . Esophagogastroduodenoscopy  12/20/1993    mild antral gastritis, Dr. Lina Sar  .  Edg  06/28/1987    Dr. Matthias HughsBuccini, bile in stomach  . Upper gastrointestinal endoscopy  07/04/2001    Dr. Lina Sarora Brodie  . Diverting ileostomy  2013  . Abdominal hysterectomy  2001    has one ovary   Family History  Problem Relation Age of Onset  . Colon polyps Paternal Uncle   . Esophageal cancer Paternal Uncle   . Diabetes Father   . Heart disease Father   . Stroke Father   . Hyperlipidemia Father   . Hyperlipidemia Mother   .  Cancer Paternal Aunt     breast  . Early death Neg Hx    History  Substance Use Topics  . Smoking status: Former Smoker    Quit date: 05/22/1982  . Smokeless tobacco: Never Used  . Alcohol Use: No   OB History   Grav Para Term Preterm Abortions TAB SAB Ect Mult Living                 Review of Systems  Ten systems reviewed and are negative for acute change, except as noted in the HPI.    Allergies  Celecoxib and Reglan  Home Medications   Prior to Admission medications   Medication Sig Start Date End Date Taking? Authorizing Provider  ALPRAZolam (XANAX) 1 MG tablet TAKE 1 TABLET BY MOUTH TWICE DAILY    Nicki Reaperegina Baity, NP  buPROPion (WELLBUTRIN XL) 150 MG 24 hr tablet Take 1 tablet (150 mg total) by mouth daily. 09/11/13   Nicki Reaperegina Baity, NP  calcium-vitamin D (OSCAL) 250-125 MG-UNIT per tablet Take 2 tablets by mouth every morning.     Historical Provider, MD  cyclobenzaprine (FLEXERIL) 10 MG tablet Take 1 tablet (10 mg total) by mouth 3 (three) times daily as needed for muscle spasms. 11/17/13   Nicki Reaperegina Baity, NP  dronabinol (MARINOL) 10 MG capsule Take 10 mg by mouth as needed.    Historical Provider, MD  EPINEPHrine (EPI-PEN) 0.3 mg/0.3 mL DEVI Inject 0.3 mLs (0.3 mg total) into the muscle once. 09/25/12   Patriciaann ClanMary B Dixon, PA-C  Ibuprofen (ADVIL MIGRAINE PO) Take 2 tablets by mouth daily as needed (for migraines).     Historical Provider, MD  Multiple Vitamins-Minerals (ADEKS) chewable tablet Chew 2 tablets by mouth every morning. Multivitamin gummie    Historical Provider, MD  omeprazole (PRILOSEC) 40 MG capsule Take 40 mg by mouth 2 (two) times daily.    Historical Provider, MD  promethazine (PHENERGAN) 6.25 MG/5ML syrup Take 20 mLs by mouth 4 (four) times daily as needed for nausea.    Historical Provider, MD  sucralfate (CARAFATE) 1 GM/10ML suspension Take 1 g by mouth 2 (two) times daily.    Historical Provider, MD  Tapentadol HCl (NUCYNTA) 75 MG TABS Take 1 tablet (75 mg total) by  mouth every 4 (four) hours as needed (for pain). 11/10/13   Nicki Reaperegina Baity, NP  traZODone (DESYREL) 100 MG tablet Take 1 tablet (100 mg total) by mouth at bedtime. 10/27/13   Nicki Reaperegina Baity, NP   BP 137/75  Pulse 86  Temp(Src) 98.4 F (36.9 C) (Oral)  SpO2 100% Physical Exam  Nursing note and vitals reviewed. Constitutional: She is oriented to person, place, and time. She appears well-developed. No distress.  Appears pale, weak, tremulous, weak speech  HENT:  Head: Normocephalic and atraumatic.  Eyes: Conjunctivae and EOM are normal. Pupils are equal, round, and reactive to light.  Neck: No JVD present.  Cardiovascular: Normal rate, regular rhythm and normal heart  sounds.   Pulmonary/Chest: Effort normal and breath sounds normal.  Abdominal: Soft. Bowel sounds are normal. There is tenderness.  RLQ ileostomy in place. Mild TTP. No signs of infection  Musculoskeletal: Normal range of motion.  Neurological: She is alert and oriented to person, place, and time.  Speech is clear and goal oriented, follows commands Major Cranial nerves without deficit, no facial droop Decreased but equal strength in upper and lower extremities bilaterally including dorsiflexion and plantar flexion. Patient with mild incoordintation . Weakness in deltoids with abduction. F-N abnormal. Gate deffered  Skin: She is not diaphoretic.   ED Course  Procedures (including critical care time) Labs Review Labs Reviewed - No data to display  Imaging Review No results found.   EKG Interpretation None      MDM   Final diagnoses:  Weakness  Paresthesia  Symptomatic orthostatic increase in heart rate    12:16 PM BP 137/75  Pulse 86  Temp(Src) 98.4 F (36.9 C) (Oral)  SpO2 100% Patient with 20 point rise in her pulse with standing. Consider pos orthostatics. No signs of acute stroke on CT. She is not considered a code stroke as onset of sxs > 12 hours and not a candidate for TPA. I doubt CVA as the cause  of her sxs today. The pateint has had similar sxs previously. Her abdominal pain is unchanged.   Marland Kitchen4:42 PM Filed Vitals:   12/04/13 1515 12/04/13 1530 12/04/13 1545 12/04/13 1706  BP: 130/64 129/68 141/80 128/67  Pulse: 74 92 94 95  Temp:    98.4 F (36.9 C)  TempSrc:    Oral  Resp: 20 21 14 14   SpO2: 99% 100% 96% 99%    Patient tolrating PO fluids and crackers. She is able to ambulate around the ED.  Her paresthesias have resolved. Patient received IV fluids here in the ED. Ambulating around the ED. No vomiting. She is recievein her last bag of fluids and will bedischarged by PA tran when her IV fluids finish. She has a follow appointment with her GI specialist at [facility] scheduled. Discussed return precaution.  Arthor Captain, PA-C 12/04/13 2250

## 2013-12-04 NOTE — ED Notes (Signed)
Notified RN of CBG 91 

## 2013-12-04 NOTE — ED Notes (Signed)
Pt stated she woke up this am at 6 exp left sided numbness and weakness of her face, arm, and leg. Pt stated she shakes when she walks. Pt denies any problem with speech or swallowing. No other s/s of distress noted.

## 2013-12-04 NOTE — ED Notes (Signed)
Jill collect cbg at 11:30am

## 2013-12-04 NOTE — ED Provider Notes (Signed)
Pt here with c/o generalized numbness.  Found to have positive orthostasis. Pt able to tolerates PO, and able to ambulate.  Will d/c once pt received her IVF.   BP 128/67  Pulse 95  Temp(Src) 98.4 F (36.9 C) (Oral)  Resp 14  SpO2 99%  I have reviewed nursing notes and vital signs. I personally reviewed the imaging tests through PACS system  I reviewed available ER/hospitalization records thought the EMR  Results for orders placed during the hospital encounter of 12/04/13  ETHANOL      Result Value Ref Range   Alcohol, Ethyl (B) <11  0 - 11 mg/dL  CBC      Result Value Ref Range   WBC 5.4  4.0 - 10.5 K/uL   RBC 4.89  3.87 - 5.11 MIL/uL   Hemoglobin 13.3  12.0 - 15.0 g/dL   HCT 16.1  09.6 - 04.5 %   MCV 76.3 (*) 78.0 - 100.0 fL   MCH 27.2  26.0 - 34.0 pg   MCHC 35.7  30.0 - 36.0 g/dL   RDW 40.9  81.1 - 91.4 %   Platelets 290  150 - 400 K/uL  DIFFERENTIAL      Result Value Ref Range   Neutrophils Relative % 61  43 - 77 %   Neutro Abs 3.3  1.7 - 7.7 K/uL   Lymphocytes Relative 27  12 - 46 %   Lymphs Abs 1.4  0.7 - 4.0 K/uL   Monocytes Relative 10  3 - 12 %   Monocytes Absolute 0.5  0.1 - 1.0 K/uL   Eosinophils Relative 1  0 - 5 %   Eosinophils Absolute 0.1  0.0 - 0.7 K/uL   Basophils Relative 1  0 - 1 %   Basophils Absolute 0.0  0.0 - 0.1 K/uL  COMPREHENSIVE METABOLIC PANEL      Result Value Ref Range   Sodium 141  137 - 147 mEq/L   Potassium 3.9  3.7 - 5.3 mEq/L   Chloride 102  96 - 112 mEq/L   CO2 24  19 - 32 mEq/L   Glucose, Bld 95  70 - 99 mg/dL   BUN 7  6 - 23 mg/dL   Creatinine, Ser 7.82  0.50 - 1.10 mg/dL   Calcium 9.0  8.4 - 95.6 mg/dL   Total Protein 7.0  6.0 - 8.3 g/dL   Albumin 3.7  3.5 - 5.2 g/dL   AST 21  0 - 37 U/L   ALT 12  0 - 35 U/L   Alkaline Phosphatase 105  39 - 117 U/L   Total Bilirubin 0.3  0.3 - 1.2 mg/dL   GFR calc non Af Amer >90  >90 mL/min   GFR calc Af Amer >90  >90 mL/min   Anion gap 15  5 - 15  URINE RAPID DRUG SCREEN (HOSP  PERFORMED)      Result Value Ref Range   Opiates NONE DETECTED  NONE DETECTED   Cocaine NONE DETECTED  NONE DETECTED   Benzodiazepines NONE DETECTED  NONE DETECTED   Amphetamines NONE DETECTED  NONE DETECTED   Tetrahydrocannabinol NONE DETECTED  NONE DETECTED   Barbiturates NONE DETECTED  NONE DETECTED  URINALYSIS, ROUTINE W REFLEX MICROSCOPIC      Result Value Ref Range   Color, Urine YELLOW  YELLOW   APPearance CLOUDY (*) CLEAR   Specific Gravity, Urine 1.009  1.005 - 1.030   pH 7.5  5.0 - 8.0  Glucose, UA NEGATIVE  NEGATIVE mg/dL   Hgb urine dipstick TRACE (*) NEGATIVE   Bilirubin Urine NEGATIVE  NEGATIVE   Ketones, ur 15 (*) NEGATIVE mg/dL   Protein, ur NEGATIVE  NEGATIVE mg/dL   Urobilinogen, UA 0.2  0.0 - 1.0 mg/dL   Nitrite NEGATIVE  NEGATIVE   Leukocytes, UA MODERATE (*) NEGATIVE  URINE MICROSCOPIC-ADD ON      Result Value Ref Range   Squamous Epithelial / LPF MANY (*) RARE   WBC, UA 3-6  <3 WBC/hpf   RBC / HPF 0-2  <3 RBC/hpf   Bacteria, UA FEW (*) RARE  CBG MONITORING, ED      Result Value Ref Range   Glucose-Capillary 91  70 - 99 mg/dL  I-STAT CHEM 8, ED      Result Value Ref Range   Sodium 142  137 - 147 mEq/L   Potassium 3.2 (*) 3.7 - 5.3 mEq/L   Chloride 102  96 - 112 mEq/L   BUN 4 (*) 6 - 23 mg/dL   Creatinine, Ser 2.950.60  0.50 - 1.10 mg/dL   Glucose, Bld 96  70 - 99 mg/dL   Calcium, Ion 2.841.13  1.321.12 - 1.23 mmol/L   TCO2 23  0 - 100 mmol/L   Hemoglobin 13.6  12.0 - 15.0 g/dL   HCT 44.040.0  10.236.0 - 72.546.0 %  I-STAT TROPOININ, ED      Result Value Ref Range   Troponin i, poc 0.00  0.00 - 0.08 ng/mL   Comment 3            Ct Head Wo Contrast  12/04/2013   CLINICAL DATA:  Numbness of the right face, law arm and leg. Unsteady gait.  EXAM: CT HEAD WITHOUT CONTRAST  TECHNIQUE: Contiguous axial images were obtained from the base of the skull through the vertex without intravenous contrast.  COMPARISON:  MRI 02/09/2012.  CT 08/03/2006.  FINDINGS: The brain has a normal  appearance without evidence of atrophy, infarction, mass lesion, hemorrhage, hydrocephalus or extra-axial collection. The calvarium is unremarkable. The paranasal sinuses, middle ears and mastoids are clear.  IMPRESSION: Normal head CT   Electronically Signed   By: Paulina FusiMark  Shogry M.D.   On: 12/04/2013 12:41      Fayrene HelperBowie Daniela Siebers, PA-C 12/04/13 1742

## 2013-12-04 NOTE — Discharge Instructions (Signed)
Please continue to rest and take in foods and fluids as you can tolerate. You will need to call and follow closely [facility] as well as your primary care physician. Please continue to take in food that you are able to tolerate. I would suggest Ensure as well as Gatorade.  Abdominal (belly) pain can be caused by many things. Your caregiver performed an examination and possibly ordered blood/urine tests and imaging (CT scan, x-rays, ultrasound). Many cases can be observed and treated at home after initial evaluation in the emergency department. Even though you are being discharged home, abdominal pain can be unpredictable. Therefore, you need a repeated exam if your pain does not resolve, returns, or worsens. Most patients with abdominal pain don't have to be admitted to the hospital or have surgery, but serious problems like appendicitis and gallbladder attacks can start out as nonspecific pain. Many abdominal conditions cannot be diagnosed in one visit, so follow-up evaluations are very important. SEEK IMMEDIATE MEDICAL ATTENTION IF: The pain does not go away or becomes severe.  A temperature above 101 develops.  Repeated vomiting occurs (multiple episodes).  The pain becomes localized to portions of the abdomen. The right side could possibly be appendicitis. In an adult, the left lower portion of the abdomen could be colitis or diverticulitis.  Blood is being passed in stools or vomit (bright red or black tarry stools).  Return also if you develop chest pain, difficulty breathing, dizziness or fainting, or become confused, poorly responsive, or inconsolable (young children).   Paresthesia Paresthesia is an abnormal burning or prickling sensation. This sensation is generally felt in the hands, arms, legs, or feet. However, it may occur in any part of the body. It is usually not painful. The feeling may be described as:  Tingling or numbness.  "Pins and needles."  Skin  crawling.  Buzzing.  Limbs "falling asleep."  Itching. Most people experience temporary (transient) paresthesia at some time in their lives. CAUSES  Paresthesia may occur when you breathe too quickly (hyperventilation). It can also occur without any apparent cause. Commonly, paresthesia occurs when pressure is placed on a nerve. The feeling quickly goes away once the pressure is removed. For some people, however, paresthesia is a long-lasting (chronic) condition caused by an underlying disorder. The underlying disorder may be:  A traumatic, direct injury to nerves. Examples include a:  Broken (fractured) neck.  Fractured skull.  A disorder affecting the brain and spinal cord (central nervous system). Examples include:  Transverse myelitis.  Encephalitis.  Transient ischemic attack.  Multiple sclerosis.  Stroke.  Tumor or blood vessel problems, such as an arteriovenous malformation pressing against the brain or spinal cord.  A condition that damages the peripheral nerves (peripheral neuropathy). Peripheral nerves are not part of the brain and spinal cord. These conditions include:  Diabetes.  Peripheral vascular disease.  Nerve entrapment syndromes, such as carpal tunnel syndrome.  Shingles.  Hypothyroidism.  Vitamin B12 deficiencies.  Alcoholism.  Heavy metal poisoning (lead, arsenic).  Rheumatoid arthritis.  Systemic lupus erythematosus. DIAGNOSIS  Your caregiver will attempt to find the underlying cause of your paresthesia. Your caregiver may:  Take your medical history.  Perform a physical exam.  Order various lab tests.  Order imaging tests. TREATMENT  Treatment for paresthesia depends on the underlying cause. HOME CARE INSTRUCTIONS  Avoid drinking alcohol.  You may consider massage or acupuncture to help relieve your symptoms.  Keep all follow-up appointments as directed by your caregiver. SEEK IMMEDIATE  MEDICAL CARE IF:   You feel  weak.  You have trouble walking or moving.  You have problems with speech or vision.  You feel confused.  You cannot control your bladder or bowel movements.  You feel numbness after an injury.  You faint.  Your burning or prickling feeling gets worse when walking.  You have pain, cramps, or dizziness.  You develop a rash. MAKE SURE YOU:  Understand these instructions.  Will watch your condition.  Will get help right away if you are not doing well or get worse. Document Released: 04/28/2002 Document Revised: 07/31/2011 Document Reviewed: 01/27/2011 [facility] Patient Information 2015 [place], [place]. This information is not intended to replace advice given to you by your health care provider. Make sure you discuss any questions you have with your health care provider.  Weakness Weakness is a lack of strength. It may be felt all over the body (generalized) or in one specific part of the body (focal). Some causes of weakness can be serious. You may need further medical evaluation, especially if you are elderly or you have a history of immunosuppression (such as chemotherapy or HIV), kidney disease, heart disease, or diabetes. CAUSES  Weakness can be caused by many different things, including:  Infection.  Physical exhaustion.  Internal bleeding or other blood loss that results in a lack of red blood cells (anemia).  Dehydration. This cause is more common in elderly people.  Side effects or electrolyte abnormalities from medicines, such as pain medicines or sedatives.  Emotional distress, anxiety, or depression.  Circulation problems, especially severe peripheral arterial disease.  Heart disease, such as rapid atrial fibrillation, bradycardia, or heart failure.  Nervous system disorders, such as Guillain-Barr syndrome, multiple sclerosis, or stroke. DIAGNOSIS  To find the cause of your weakness, your caregiver will take your history and perform a physical exam. Lab  tests or X-rays may also be ordered, if needed. TREATMENT  Treatment of weakness depends on the cause of your symptoms and can vary greatly. HOME CARE INSTRUCTIONS   Rest as needed.  Eat a well-balanced diet.  Try to get some exercise every day.  Only take over-the-counter or prescription medicines as directed by your caregiver. SEEK MEDICAL CARE IF:   Your weakness seems to be getting worse or spreads to other parts of your body.  You develop new aches or pains. SEEK IMMEDIATE MEDICAL CARE IF:   You cannot perform your normal daily activities, such as getting dressed and feeding yourself.  You cannot walk up and down stairs, or you feel exhausted when you do so.  You have shortness of breath or chest pain.  You have difficulty moving parts of your body.  You have weakness in only one area of the body or on only one side of the body.  You have a fever.  You have trouble speaking or swallowing.  You cannot control your bladder or bowel movements.  You have black or bloody vomit or stools. MAKE SURE YOU:  Understand these instructions.  Will watch your condition.  Will get help right away if you are not doing well or get worse. Document Released: 05/08/2005 Document Revised: 11/07/2011 Document Reviewed: 07/07/2011 [employer] Patient Information 2015 [place], [place]. This information is not intended to replace advice given to you by your health care provider. Make sure you discuss any questions you have with your health care provider.

## 2013-12-05 NOTE — ED Provider Notes (Addendum)
Medical screening examination/treatment/procedure(s) were conducted as a shared visit with non-physician practitioner(s) and myself.  I personally evaluated the patient during the encounter. Several days of generalized weakness with paresthesias, decreased ileostomy output. No fever.  Patient globally weak with some voluntary tremors.  Abdomen soft, gas and stool in ileostomy. Orthostatics positive.  No focal neuro deficits.  Labs at baseline.  Suspect patient's symptoms due to dehydration and orthostasis.  Observation admission offered to patient which she declines. She is tolerating PO and ambulatory and states she is feeling better.  She is well versed in giving herself IV fluids at home which she does daily.  Husband is IT sales professionalfirefighter. She has follow up with her GI doctor tomorrow.   EKG Interpretation   Date/Time:  Thursday December 04 2013 12:19:11 EDT Ventricular Rate:  82 PR Interval:  165 QRS Duration: 83 QT Interval:  381 QTC Calculation: 445 R Axis:   32 Text Interpretation:  Sinus rhythm No significant change was found  Confirmed by Manus GunningANCOUR  MD, Yomaris Palecek [phone]([id number]) on 12/04/2013 12:25:24 PM       Glynn OctaveStephen Mylo Driskill, MD 12/05/13 1906  Glynn OctaveStephen Hannan Hutmacher, MD 12/05/13 [id number]

## 2013-12-05 NOTE — ED Provider Notes (Signed)
Medical screening examination/treatment/procedure(s) were performed by non-physician practitioner and as supervising physician I was immediately available for consultation/collaboration.   EKG Interpretation   Date/Time:  Thursday December 04 2013 12:19:11 EDT Ventricular Rate:  82 PR Interval:  165 QRS Duration: 83 QT Interval:  381 QTC Calculation: 445 R Axis:   32 Text Interpretation:  Sinus rhythm No significant change was found  Confirmed by Manus GunningANCOUR  MD, STEPHEN ([id number]) on 12/04/2013 12:25:24 PM        Shon Batonourtney F Horton, MD 12/05/13 [phone]

## 2013-12-08 ENCOUNTER — Encounter: Payer: Self-pay | Admitting: Internal Medicine

## 2013-12-08 ENCOUNTER — Ambulatory Visit (INDEPENDENT_AMBULATORY_CARE_PROVIDER_SITE_OTHER): Payer: 59 | Admitting: Internal Medicine

## 2013-12-08 VITALS — BP 100/64 | HR 98 | Temp 98.4°F

## 2013-12-08 DIAGNOSIS — E876 Hypokalemia: Secondary | ICD-10-CM

## 2013-12-08 DIAGNOSIS — I951 Orthostatic hypotension: Secondary | ICD-10-CM

## 2013-12-08 DIAGNOSIS — E860 Dehydration: Secondary | ICD-10-CM

## 2013-12-08 DIAGNOSIS — K599 Functional intestinal disorder, unspecified: Secondary | ICD-10-CM

## 2013-12-08 DIAGNOSIS — K598 Other specified functional intestinal disorders: Secondary | ICD-10-CM

## 2013-12-08 DIAGNOSIS — K5989 Other specified functional intestinal disorders: Secondary | ICD-10-CM

## 2013-12-08 MED ORDER — POTASSIUM CHLORIDE CRYS ER 10 MEQ PO TBCR: 10.0000 meq | EXTENDED_RELEASE_TABLET | Freq: Every day | ORAL | Status: DC

## 2013-12-08 NOTE — Patient Instructions (Addendum)
Dehydration, Adult Dehydration is when you lose more fluids from the body than you take in. Vital organs like the kidneys, brain, and heart cannot function without a proper amount of fluids and salt. Any loss of fluids from the body can cause dehydration.  CAUSES   Vomiting.  Diarrhea.  Excessive sweating.  Excessive urine output.  Fever. SYMPTOMS  Mild dehydration  Thirst.  Dry lips.  Slightly dry mouth. Moderate dehydration  Very dry mouth.  Sunken eyes.  Skin does not bounce back quickly when lightly pinched and released.  Dark urine and decreased urine production.  Decreased tear production.  Headache. Severe dehydration  Very dry mouth.  Extreme thirst.  Rapid, weak pulse (more than 100 beats per minute at rest).  Cold hands and feet.  Not able to sweat in spite of heat and temperature.  Rapid breathing.  Blue lips.  Confusion and lethargy.  Difficulty being awakened.  Minimal urine production.  No tears. DIAGNOSIS  Your caregiver will diagnose dehydration based on your symptoms and your exam. Blood and urine tests will help confirm the diagnosis. The diagnostic evaluation should also identify the cause of dehydration. TREATMENT  Treatment of mild or moderate dehydration can often be done at home by increasing the amount of fluids that you drink. It is best to drink small amounts of fluid more often. Drinking too much at one time can make vomiting worse. Refer to the home care instructions below. Severe dehydration needs to be treated at the hospital where you will probably be given intravenous (IV) fluids that contain water and electrolytes. HOME CARE INSTRUCTIONS   Ask your caregiver about specific rehydration instructions.  Drink enough fluids to keep your urine clear or pale yellow.  Drink small amounts frequently if you have nausea and vomiting.  Eat as you normally do.  Avoid:  Foods or drinks high in sugar.  Carbonated  drinks.  Juice.  Extremely hot or cold fluids.  Drinks with caffeine.  Fatty, greasy foods.  Alcohol.  Tobacco.  Overeating.  Gelatin desserts.  Wash your hands well to avoid spreading bacteria and viruses.  Only take over-the-counter or prescription medicines for pain, discomfort, or fever as directed by your caregiver.  Ask your caregiver if you should continue all prescribed and over-the-counter medicines.  Keep all follow-up appointments with your caregiver. SEEK MEDICAL CARE IF:  You have abdominal pain and it increases or stays in one area (localizes).  You have a rash, stiff neck, or severe headache.  You are irritable, sleepy, or difficult to awaken.  You are weak, dizzy, or extremely thirsty. SEEK IMMEDIATE MEDICAL CARE IF:   You are unable to keep fluids down or you get worse despite treatment.  You have frequent episodes of vomiting or diarrhea.  You have blood or green matter (bile) in your vomit.  You have blood in your stool or your stool looks black and tarry.  You have not urinated in 6 to 8 hours, or you have only urinated a small amount of very dark urine.  You have a fever.  You faint. MAKE SURE YOU:   Understand these instructions.  Will watch your condition.  Will get help right away if you are not doing well or get worse. Document Released: 05/08/2005 Document Revised: 07/31/2011 Document Reviewed: 12/26/2010 Exit[employer] Patient Information 2015 [employer]. This information is not intended to replace advice given to you by your health care provider. Make sure you discuss any questions you have with your health care   provider.  

## 2013-12-08 NOTE — Progress Notes (Signed)
Subjective:    Patient ID: Judy Gibbs, female    DOB: [birth year], 48 y.o.   MRN: [id number]  HPI  Pt presents to the clinic today to follow up ER visit for dehydration and orthostasis. She went to the ER 12/04/13 with c/o weakness and paresthesia. She was found to be tachycardic on exam, worse with standing. CT scan of head was negative for acute intracranial process. ECG was normal. She was resuscitated with IVF. She did decline admission at that time because she had a follow up appt with [facility] the next day. She does reports that she had to reschedule that appt due to feeling so poorly. She still feels extremely fatigued, weak. She is doing her best to hold down fluids. She is drinking ensure. She has been in touch with her GI doctor regarding a follow up appt. They have not gotten back in touch with her.  Review of Systems      Past Medical History  Diagnosis Date  . Bipolar disorder   . DDD (degenerative disc disease), cervical   . Anxiety   . Depression   . Fibromyalgia   . GERD (gastroesophageal reflux disease)   . IBS (irritable bowel syndrome)   . Raynauds phenomenon   . Gastroparesis   . Colonic inertia   . Proctalgia   . Pelvic floor dysfunction   . Hypoglycemia   . Interstitial cystitis   . Obesity   . Vertigo     Current Outpatient Prescriptions  Medication Sig Dispense Refill  . ALPRAZolam (XANAX) 1 MG tablet Take 1 mg by mouth at bedtime as needed for anxiety.      Marland Kitchen. buPROPion (WELLBUTRIN XL) 150 MG 24 hr tablet Take 1 tablet (150 mg total) by mouth daily.  30 tablet  2  . calcium-vitamin D (OSCAL) 250-125 MG-UNIT per tablet Take 2 tablets by mouth every morning.       . cyclobenzaprine (FLEXERIL) 10 MG tablet Take 1 tablet (10 mg total) by mouth 3 (three) times daily as needed for muscle spasms.  30 tablet  0  . dronabinol (MARINOL) 10 MG capsule Take 10 mg by mouth daily as needed (nausea).       . Ibuprofen (ADVIL MIGRAINE PO) Take 2 tablets by mouth  daily as needed (for migraines).       . Multiple Vitamins-Minerals (ADEKS) chewable tablet Chew 2 tablets by mouth every morning. Multivitamin gummie      . omeprazole (PRILOSEC) 40 MG capsule Take 40 mg by mouth 2 (two) times daily.      . promethazine (PHENERGAN) 6.25 MG/5ML syrup Take 20 mLs by mouth 4 (four) times daily as needed for nausea.      . sucralfate (CARAFATE) 1 GM/10ML suspension Take 1 g by mouth 2 (two) times daily.      . Tapentadol HCl (NUCYNTA) 75 MG TABS Take 1 tablet (75 mg total) by mouth every 4 (four) hours as needed (for pain).  180 each  0  . traZODone (DESYREL) 100 MG tablet Take 1 tablet (100 mg total) by mouth at bedtime.  30 tablet  0   No current facility-administered medications for this visit.    Allergies  Allergen Reactions  . Celecoxib     REACTION: hives  . Reglan [Metoclopramide] Anxiety    Family History  Problem Relation Age of Onset  . Colon polyps Paternal Uncle   . Esophageal cancer Paternal Uncle   . Diabetes Father   . Heart  disease Father   . Stroke Father   . Hyperlipidemia Father   . Hyperlipidemia Mother   . Cancer Paternal Aunt     breast  . Early death Neg Hx     History   Social History  . Marital Status: Married    Spouse Name: N/A    Number of Children: 2  . Years of Education: N/A   Occupational History  . homemaker    Social History Main Topics  . Smoking status: Former Smoker    Quit date: 05/22/1982  . Smokeless tobacco: Never Used  . Alcohol Use: No  . Drug Use: No  . Sexual Activity: Yes    Birth Control/ Protection: Surgical   Other Topics Concern  . Not on file   Social History Narrative  . No narrative on file     Constitutional: Pt reports fatigue. Denies fever, malaise, headache or abrupt weight changes.  Respiratory: Denies difficulty breathing, shortness of breath, cough or sputum production.   Cardiovascular: Denies chest pain, chest tightness, palpitations or swelling in the hands or  feet.  Gastrointestinal: Pt reports nausea. Denies abdominal pain,  constipation, diarrhea or blood in the stool.  Musculoskeletal: Pt reports generalized weakness. Denies decrease in range of motion, muscle pain or joint pain and swelling.    No other specific complaints in a complete review of systems (except as listed in HPI above).  Objective:   Physical Exam   BP 100/64  Pulse 98  Temp(Src) 98.4 F (36.9 C) (Oral)  SpO2 99% Wt Readings from Last 3 Encounters:  10/10/13 187 lb (84.823 kg)  09/11/13 191 lb (86.637 kg)  08/07/13 188 lb (85.276 kg)    General: Appears her stated age, ill appearing in NAD. Cardiovascular: Normal rate and rhythm. S1,S2 noted.  No murmur, rubs or gallops noted. No JVD or BLE edema. No carotid bruits noted. Pulmonary/Chest: Normal effort and positive vesicular breath sounds. No respiratory distress. No wheezes, rales or ronchi noted.  Abdomen: Soft and nontender. Normal bowel sounds, no bruits noted. No distention or masses noted. Liver, spleen and kidneys non palpable. Musculoskeletal: Unable to assess, in wheelchair.   BMET    Component Value Date/Time   NA 142 12/04/2013 1159   K 3.2* 12/04/2013 1159   CL 102 12/04/2013 1159   CO2 24 12/04/2013 1134   GLUCOSE 96 12/04/2013 1159   BUN 4* 12/04/2013 1159   CREATININE 0.60 12/04/2013 1159   CALCIUM 9.0 12/04/2013 1134   GFRNONAA >90 12/04/2013 1134   GFRAA >90 12/04/2013 1134    Lipid Panel  No results found for this basename: chol, trig, hdl, cholhdl, vldl, ldlcalc    CBC    Component Value Date/Time   WBC 5.4 12/04/2013 1134   RBC 4.89 12/04/2013 1134   HGB 13.6 12/04/2013 1159   HCT 40.0 12/04/2013 1159   PLT 290 12/04/2013 1134   MCV 76.3* 12/04/2013 1134   MCH 27.2 12/04/2013 1134   MCHC 35.7 12/04/2013 1134   RDW 14.6 12/04/2013 1134   LYMPHSABS 1.4 12/04/2013 1134   MONOABS 0.5 12/04/2013 1134   EOSABS 0.1 12/04/2013 1134   BASOSABS 0.0 12/04/2013 1134    Hgb A1C No results found for  this basename: HGBA1C        Assessment & Plan:  Hospital follow up for dehydration, orthostasis and colonic inertia:  Hospitals notes, imaging, labs reviewed. Time to review approx 15 minutes. She is ill appearing today Will supplement her kcl- 10 meq daily  Plan to recheck CMET in 1 month I advised her to get in touch with GI and request a follow up appt because she is feeling so poorly If she can not get an appt or feels worse, advised ER  RTC as needed

## 2013-12-08 NOTE — Progress Notes (Signed)
Pre visit review using our clinic review tool, if applicable. No additional management support is needed unless otherwise documented below in the visit note. 

## 2013-12-10 ENCOUNTER — Telehealth: Payer: Self-pay

## 2013-12-10 NOTE — Telephone Encounter (Signed)
noted 

## 2013-12-10 NOTE — Telephone Encounter (Signed)
Weston BrassNick PT with [facility] left v/m; pt had cancelled PT home health for 12/09/13 and pt refuses to schedule any more PT home health visits due to pt having special needs child that does not feel comfortable with strangers in the home. Pt has been instructed on home exercise program.Nick does not require cb unless further instructions needed.

## 2013-12-18 ENCOUNTER — Ambulatory Visit: Payer: 59 | Admitting: Psychology

## 2013-12-19 ENCOUNTER — Other Ambulatory Visit: Payer: Self-pay | Admitting: Internal Medicine

## 2013-12-19 ENCOUNTER — Encounter: Payer: Self-pay | Admitting: Internal Medicine

## 2013-12-19 NOTE — Telephone Encounter (Signed)
Please advise if okay to call in to pharmacy

## 2013-12-19 NOTE — Telephone Encounter (Signed)
Ok to phone in, not sure why it was d/c'd in ER

## 2013-12-19 NOTE — Telephone Encounter (Signed)
Called pharmacy and they stated that the last fill they have for Xanax was 11/10/13--please advise

## 2013-12-19 NOTE — Telephone Encounter (Signed)
Rx called in to pharmacy. 

## 2013-12-19 NOTE — Telephone Encounter (Signed)
This was filled 7/16, too early now

## 2013-12-22 ENCOUNTER — Encounter: Payer: Self-pay | Admitting: Internal Medicine

## 2013-12-23 ENCOUNTER — Other Ambulatory Visit: Payer: Self-pay | Admitting: Internal Medicine

## 2013-12-23 DIAGNOSIS — K599 Functional intestinal disorder, unspecified: Secondary | ICD-10-CM

## 2013-12-23 DIAGNOSIS — M5030 Other cervical disc degeneration, unspecified cervical region: Secondary | ICD-10-CM

## 2013-12-23 MED ORDER — TAPENTADOL HCL 75 MG PO TABS: 75.0000 mg | ORAL_TABLET | ORAL | Status: DC | PRN

## 2013-12-23 MED ORDER — ALPRAZOLAM 1 MG PO TABS: 1.0000 mg | ORAL_TABLET | Freq: Every evening | ORAL | Status: DC | PRN

## 2013-12-24 NOTE — Telephone Encounter (Signed)
Mr Judy Gibbs called requesting status of refills; [facility]

## 2013-12-25 ENCOUNTER — Encounter: Payer: Self-pay | Admitting: Internal Medicine

## 2013-12-26 ENCOUNTER — Encounter: Payer: Self-pay | Admitting: Internal Medicine

## 2013-12-29 ENCOUNTER — Encounter: Payer: Self-pay | Admitting: Internal Medicine

## 2013-12-29 ENCOUNTER — Other Ambulatory Visit: Payer: Self-pay | Admitting: Internal Medicine

## 2013-12-29 DIAGNOSIS — K435 Parastomal hernia without obstruction or  gangrene: Secondary | ICD-10-CM

## 2013-12-30 ENCOUNTER — Encounter: Payer: Self-pay | Admitting: Internal Medicine

## 2013-12-31 ENCOUNTER — Encounter: Payer: Self-pay | Admitting: Internal Medicine

## 2014-01-01 ENCOUNTER — Encounter: Payer: Self-pay | Admitting: Internal Medicine

## 2014-01-05 ENCOUNTER — Encounter: Payer: Self-pay | Admitting: Internal Medicine

## 2014-01-06 ENCOUNTER — Encounter: Payer: Self-pay | Admitting: Internal Medicine

## 2014-01-10 ENCOUNTER — Other Ambulatory Visit: Payer: Self-pay | Admitting: Internal Medicine

## 2014-01-12 ENCOUNTER — Other Ambulatory Visit: Payer: Self-pay

## 2014-01-12 ENCOUNTER — Encounter: Payer: Self-pay | Admitting: Internal Medicine

## 2014-01-12 DIAGNOSIS — F32A Depression, unspecified: Secondary | ICD-10-CM

## 2014-01-12 DIAGNOSIS — F329 Major depressive disorder, single episode, unspecified: Secondary | ICD-10-CM

## 2014-01-12 MED ORDER — BUPROPION HCL ER (XL) 150 MG PO TB24: 150.0000 mg | ORAL_TABLET | Freq: Every day | ORAL | Status: DC

## 2014-01-12 NOTE — Telephone Encounter (Signed)
Last filled 09/11/2013 with 2 refills--please advise

## 2014-01-13 ENCOUNTER — Other Ambulatory Visit: Payer: Self-pay | Admitting: Internal Medicine

## 2014-01-13 MED ORDER — CYCLOBENZAPRINE HCL 10 MG PO TABS: 10.0000 mg | ORAL_TABLET | Freq: Three times a day (TID) | ORAL | Status: DC | PRN

## 2014-01-21 ENCOUNTER — Encounter: Payer: Self-pay | Admitting: Internal Medicine

## 2014-01-21 ENCOUNTER — Ambulatory Visit (INDEPENDENT_AMBULATORY_CARE_PROVIDER_SITE_OTHER): Payer: 59 | Admitting: Internal Medicine

## 2014-01-21 VITALS — BP 102/60 | HR 105 | Temp 97.8°F | Wt 197.0 lb

## 2014-01-21 DIAGNOSIS — K599 Functional intestinal disorder, unspecified: Secondary | ICD-10-CM

## 2014-01-21 DIAGNOSIS — K598 Other specified functional intestinal disorders: Secondary | ICD-10-CM

## 2014-01-21 DIAGNOSIS — K5989 Other specified functional intestinal disorders: Secondary | ICD-10-CM

## 2014-01-21 DIAGNOSIS — M5030 Other cervical disc degeneration, unspecified cervical region: Secondary | ICD-10-CM

## 2014-01-21 MED ORDER — TAPENTADOL HCL 75 MG PO TABS: 75.0000 mg | ORAL_TABLET | ORAL | Status: DC | PRN

## 2014-01-21 MED ORDER — ALPRAZOLAM 1 MG PO TABS: 1.0000 mg | ORAL_TABLET | Freq: Every evening | ORAL | Status: DC | PRN

## 2014-01-21 NOTE — Progress Notes (Signed)
Subjective:    Patient ID: Judy Gibbs, female    DOB: [birth year], 48 y.o.   MRN: [id number]  HPI  Pt presents to the clinic today for medication refill. She does need her Nucynta. She also c/o some stomach pain. She has a very complicated history including GERD, IBS, gastroparesis and colonic inertia. 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 She is passing a lot of odorless gas from her rectum. Her ileostomy is putting out a lot of liquid stool despite her decrease in appetite. She is not able to eat much because of the pain and bloating in her stomach. Her weight has remained stable. She is following with Dr. Alycia [facility]setti at [facility]. She is transitioning her care to [facility] but was unable to get an appointment there until 03/2014. She is supposed to be having a flex sigmoidoscopy for evaluation of the pain, but this has not been scheduled yet.  Review of Systems      Past Medical History  Diagnosis Date  . Bipolar disorder   . DDD (degenerative disc disease), cervical   . Anxiety   . Depression   . Fibromyalgia   . GERD (gastroesophageal reflux disease)   . IBS (irritable bowel syndrome)   . Raynauds phenomenon   . Gastroparesis   . Colonic inertia   . Proctalgia   . Pelvic floor dysfunction   . Hypoglycemia   . Interstitial cystitis   . Obesity   . Vertigo     Current Outpatient Prescriptions  Medication Sig Dispense Refill  . ALPRAZolam (XANAX) 1 MG tablet Take 1 tablet (1 mg total) by mouth at bedtime as needed for anxiety.  30 tablet  0  . buPROPion (WELLBUTRIN XL) 150 MG 24 hr tablet Take 1 tablet (150 mg total) by mouth daily.  30 tablet  2  . calcium-vitamin D (OSCAL) 250-125 MG-UNIT per tablet Take 2 tablets by mouth every morning.       . cyclobenzaprine (FLEXERIL) 10 MG tablet Take 1 tablet (10 mg total) by mouth 3 (three) times daily as needed for muscle spasms.  30 tablet  0  . dronabinol (MARINOL) 10 MG capsule Take 10 mg by mouth  daily as needed (nausea).       . Ibuprofen (ADVIL MIGRAINE PO) Take 2 tablets by mouth daily as needed (for migraines).       . Multiple Vitamins-Minerals (ADEKS) chewable tablet Chew 2 tablets by mouth every morning. Multivitamin gummie      . omeprazole (PRILOSEC) 40 MG capsule Take 40 mg by mouth 2 (two) times daily.      . potassium chloride (K-DUR,KLOR-CON) 10 MEQ tablet Take 1 tablet (10 mEq total) by mouth daily.  30 tablet  2  . promethazine (PHENERGAN) 6.25 MG/5ML syrup Take 20 mLs by mouth 4 (four) times daily as needed for nausea.      . sucralfate (CARAFATE) 1 GM/10ML suspension Take 1 g by mouth 2 (two) times daily.      . Tapentadol HCl (NUCYNTA) 75 MG TABS Take 1 tablet (75 mg total) by mouth every 4 (four) hours as needed (for pain).  180 each  0  . traZODone (DESYREL) 100 MG tablet Take 1 tablet (100 mg total) by mouth at bedtime.  30 tablet  0   No current facility-administered medications for this visit.    Allergies  Allergen Reactions  . Celecoxib     REACTION: hives  . Reglan [Metoclopramide]  Anxiety    Family History  Problem Relation Age of Onset  . Colon polyps Paternal Uncle   . Esophageal cancer Paternal Uncle   . Diabetes Father   . Heart disease Father   . Stroke Father   . Hyperlipidemia Father   . Hyperlipidemia Mother   . Cancer Paternal Aunt     breast  . Early death Neg Hx     History   Social History  . Marital Status: Married    Spouse Name: N/A    Number of Children: 2  . Years of Education: N/A   Occupational History  . homemaker    Social History Main Topics  . Smoking status: Former Smoker    Quit date: 05/22/1982  . Smokeless tobacco: Never Used  . Alcohol Use: No  . Drug Use: No  . Sexual Activity: Yes    Birth Control/ Protection: Surgical   Other Topics Concern  . Not on file   Social History Narrative  . No narrative on file     Constitutional: Pt reports fatigue. Denies fever, malaise,  headache or abrupt  weight changes.  Respiratory: Denies difficulty breathing, shortness of breath, cough or sputum production.   Cardiovascular: Denies chest pain, chest tightness, palpitations or swelling in the hands or feet.  Gastrointestinal: Pt reports abdominal pain. Denies  bloating, constipation, diarrhea or blood in the stool.  GU: Denies urgency, frequency, pain with urination, burning sensation, blood in urine, odor or discharge. Musculoskeletal: Denies decrease in range of motion, difficulty with gait, muscle pain or joint pain and swelling.  Skin: Denies redness, rashes, lesions or ulcercations.  Neurological: Denies dizziness, difficulty with memory, difficulty with speech or problems with balance and coordination.   No other specific complaints in a complete review of systems (except as listed in HPI above).  Objective:   Physical Exam  BP 102/60  Pulse 105  Temp(Src) 97.8 F (36.6 C) (Oral)  Wt 197 lb (89.359 kg)  SpO2 98% Wt Readings from Last 3 Encounters:  01/21/14 197 lb (89.359 kg)  10/10/13 187 lb (84.823 kg)  09/11/13 191 lb (86.637 kg)    General: Appears her stated age, chronically ill appearing in NAD. Cardiovascular: Tachycardic with normal rhythm. S1,S2 noted.  No murmur, rubs or gallops noted.  Pulmonary/Chest: Normal effort and positive vesicular breath sounds. No respiratory distress. No wheezes, rales or ronchi noted.  Abdomen: Soft and generally tender. Normal bowel sounds, no bruits noted. Ileostomy noted in the RLQ. Stoma looks good. Liver, spleen and kidneys non palpable.   BMET    Component Value Date/Time   NA 142 12/04/2013 1159   K 3.2* 12/04/2013 1159   CL 102 12/04/2013 1159   CO2 24 12/04/2013 1134   GLUCOSE 96 12/04/2013 1159   BUN 4* 12/04/2013 1159   CREATININE 0.60 12/04/2013 1159   CALCIUM 9.0 12/04/2013 1134   GFRNONAA >90 12/04/2013 1134   GFRAA >90 12/04/2013 1134    Lipid Panel  No results found for this basename: chol, trig, hdl, cholhdl, vldl,  ldlcalc    CBC    Component Value Date/Time   WBC 5.4 12/04/2013 1134   RBC 4.89 12/04/2013 1134   HGB 13.6 12/04/2013 1159   HCT 40.0 12/04/2013 1159   PLT 290 12/04/2013 1134   MCV 76.3* 12/04/2013 1134   MCH 27.2 12/04/2013 1134   MCHC 35.7 12/04/2013 1134   RDW 14.6 12/04/2013 1134   LYMPHSABS 1.4 12/04/2013 1134   MONOABS 0.5 12/04/2013 1134  EOSABS 0.1 12/04/2013 1134   BASOSABS 0.0 12/04/2013 1134    Hgb A1C No results found for this basename: HGBA1C         Assessment & Plan:

## 2014-01-21 NOTE — Patient Instructions (Signed)

## 2014-01-21 NOTE — Assessment & Plan Note (Signed)
Awaiting flex sig Will continue current medications at this time Wait for appt at [facility] with Dr. Standley Dakins and Xanax refilled today

## 2014-01-21 NOTE — Progress Notes (Signed)
Pre visit review using our clinic review tool, if applicable. No additional management support is needed unless otherwise documented below in the visit note. 

## 2014-01-23 ENCOUNTER — Encounter: Payer: Self-pay | Admitting: Internal Medicine

## 2014-02-01 ENCOUNTER — Encounter: Payer: Self-pay | Admitting: Internal Medicine

## 2014-02-02 ENCOUNTER — Encounter: Payer: Self-pay | Admitting: Internal Medicine

## 2014-02-03 ENCOUNTER — Ambulatory Visit: Payer: Self-pay | Admitting: Internal Medicine

## 2014-02-04 ENCOUNTER — Encounter: Payer: Self-pay | Admitting: Internal Medicine

## 2014-02-04 ENCOUNTER — Ambulatory Visit (INDEPENDENT_AMBULATORY_CARE_PROVIDER_SITE_OTHER): Payer: 59 | Admitting: Internal Medicine

## 2014-02-04 ENCOUNTER — Ambulatory Visit: Payer: Self-pay | Admitting: Internal Medicine

## 2014-02-04 ENCOUNTER — Ambulatory Visit (INDEPENDENT_AMBULATORY_CARE_PROVIDER_SITE_OTHER)
Admission: RE | Admit: 2014-02-04 | Discharge: 2014-02-04 | Disposition: A | Discharge status: Home / Self Care | Payer: 59 | Source: Ambulatory Visit | Attending: Internal Medicine | Admitting: Internal Medicine

## 2014-02-04 VITALS — BP 108/58 | HR 119 | Temp 99.1°F

## 2014-02-04 DIAGNOSIS — R109 Unspecified abdominal pain: Secondary | ICD-10-CM

## 2014-02-04 DIAGNOSIS — Z932 Ileostomy status: Secondary | ICD-10-CM

## 2014-02-04 DIAGNOSIS — R509 Fever, unspecified: Secondary | ICD-10-CM

## 2014-02-04 MED ORDER — IOHEXOL 300 MG/ML  SOLN
100.0000 mL | Freq: Once | INTRAMUSCULAR | Status: AC | PRN
  Administered 2014-02-04: 100 mL via INTRAVENOUS

## 2014-02-04 NOTE — Patient Instructions (Signed)

## 2014-02-04 NOTE — Progress Notes (Signed)
Subjective:    Patient ID: Judy Gibbs, female    DOB: [birth year], 48 y.o.   MRN: [id number]  HPI  Pt presents to the clinic today with c/o abdominal pain, bruising of the abdomen, running fevers and passing large stools out of her rectum. 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 She is currently being followed by Dr. Alycia Rossetti and [facility]. She does have a new patient appt with Dr. Joseph Art at [facility] in 03/2014. She has had a small hernia around her ileostomy that was seen on CT scan 06/2012. She was evaluated by a surgeon at [facility] who opted not to repair the hernia this time. Dr. Alycia Rossetti also did not feel that the hernia needed surgical intervention. Pt has a history of colonic inertia, gastroparesis and GERD. She reports that she is only able to eat small amounts of mashed potato's, jello. She does drink boost and drinks as much fluid as she can. She does get 1 liter of IV fluids through a port 2 x week.  Review of Systems      Past Medical History  Diagnosis Date  . Bipolar disorder   . DDD (degenerative disc disease), cervical   . Anxiety   . Depression   . Fibromyalgia   . GERD (gastroesophageal reflux disease)   . IBS (irritable bowel syndrome)   . Raynauds phenomenon   . Gastroparesis   . Colonic inertia   . Proctalgia   . Pelvic floor dysfunction   . Hypoglycemia   . Interstitial cystitis   . Obesity   . Vertigo     Current Outpatient Prescriptions  Medication Sig Dispense Refill  . ALPRAZolam (XANAX) 1 MG tablet Take 1 tablet (1 mg total) by mouth at bedtime as needed for anxiety.  30 tablet  0  . buPROPion (WELLBUTRIN XL) 150 MG 24 hr tablet Take 1 tablet (150 mg total) by mouth daily.  30 tablet  2  . calcium-vitamin D (OSCAL) 250-125 MG-UNIT per tablet Take 2 tablets by mouth every morning.       . cyclobenzaprine (FLEXERIL) 10 MG tablet Take 1 tablet (10 mg total) by mouth 3 (three) times daily as needed for muscle spasms.  30 tablet  0  . dronabinol (MARINOL)  10 MG capsule Take 10 mg by mouth daily as needed (nausea).       . Ibuprofen (ADVIL MIGRAINE PO) Take 2 tablets by mouth daily as needed (for migraines).       . Multiple Vitamins-Minerals (ADEKS) chewable tablet Chew 2 tablets by mouth every morning. Multivitamin gummie      . omeprazole (PRILOSEC) 40 MG capsule Take 40 mg by mouth 2 (two) times daily.      . potassium chloride (K-DUR,KLOR-CON) 10 MEQ tablet Take 1 tablet (10 mEq total) by mouth daily.  30 tablet  2  . promethazine (PHENERGAN) 6.25 MG/5ML syrup Take 20 mLs by mouth 4 (four) times daily as needed for nausea.      . sucralfate (CARAFATE) 1 GM/10ML suspension Take 1 g by mouth 2 (two) times daily.      . Tapentadol HCl (NUCYNTA) 75 MG TABS Take 1 tablet (75 mg total) by mouth every 4 (four) hours as needed (for pain).  180 each  0  . traZODone (DESYREL) 100 MG tablet Take 1 tablet (100 mg total) by mouth at bedtime.  30 tablet  0   No current facility-administered medications for this visit.    Allergies  Allergen Reactions  . Celecoxib     REACTION: hives  . Reglan [Metoclopramide] Anxiety    Family History  Problem Relation Age of Onset  . Colon polyps Paternal Uncle   . Esophageal cancer Paternal Uncle   . Diabetes Father   . Heart disease Father   . Stroke Father   . Hyperlipidemia Father   . Hyperlipidemia Mother   . Cancer Paternal Aunt     breast  . Early death Neg Hx     History   Social History  . Marital Status: Married    Spouse Name: N/A    Number of Children: 2  . Years of Education: N/A   Occupational History  . homemaker    Social History Main Topics  . Smoking status: Former Smoker    Quit date: 05/22/1982  . Smokeless tobacco: Never Used  . Alcohol Use: No  . Drug Use: No  . Sexual Activity: Yes    Birth Control/ Protection: Surgical   Other Topics Concern  . Not on file   Social History Narrative  . No narrative on file     Constitutional: Pt reports fever, fatigue.  Denies headache or abrupt weight changes.  Gastrointestinal: Pt reports abdominal pain. Denies constipation, diarrhea or blood in the stool.  Skin: Pt reports bruising of the abdominal wall. Denies rashes, lesions or ulcercations.     No other specific complaints in a complete review of systems (except as listed in HPI above).  Objective:   Physical Exam   BP 108/58  Pulse 119  Temp(Src) 99.1 F (37.3 C) (Oral)  SpO2 98% Wt Readings from Last 3 Encounters:  01/21/14 197 lb (89.359 kg)  10/10/13 187 lb (84.823 kg)  09/11/13 191 lb (86.637 kg)    General: Appears her stated age, well developed, well nourished in NAD. Skin: Warm, dry and intact. Discoloration noted of the left lower abdomen- ? Bruising. Cardiovascular: Tachycardic with normal rhythm. S1,S2 noted.  No murmur, rubs or gallops noted. No JVD or BLE edema. Pulmonary/Chest: Normal effort and positive vesicular breath sounds. No respiratory distress. No wheezes, rales or ronchi noted.  Abdomen: Soft and generally tender, especially in the RLQ. Normal bowel sounds, no bruits noted. No distention or masses noted. Liver, spleen and kidneys non palpable.  BMET    Component Value Date/Time   NA 142 12/04/2013 1159   K 3.2* 12/04/2013 1159   CL 102 12/04/2013 1159   CO2 24 12/04/2013 1134   GLUCOSE 96 12/04/2013 1159   BUN 4* 12/04/2013 1159   CREATININE 0.60 12/04/2013 1159   CALCIUM 9.0 12/04/2013 1134   GFRNONAA >90 12/04/2013 1134   GFRAA >90 12/04/2013 1134    Lipid Panel  No results found for this basename: chol, trig, hdl, cholhdl, vldl, ldlcalc    CBC    Component Value Date/Time   WBC 5.4 12/04/2013 1134   RBC 4.89 12/04/2013 1134   HGB 13.6 12/04/2013 1159   HCT 40.0 12/04/2013 1159   PLT 290 12/04/2013 1134   MCV 76.3* 12/04/2013 1134   MCH 27.2 12/04/2013 1134   MCHC 35.7 12/04/2013 1134   RDW 14.6 12/04/2013 1134   LYMPHSABS 1.4 12/04/2013 1134   MONOABS 0.5 12/04/2013 1134   EOSABS 0.1 12/04/2013 1134   BASOSABS  0.0 12/04/2013 1134    Hgb A1C No results found for this basename: HGBA1C        Assessment & Plan:   Fever, abdominal pain, passing stool through rectum with  working ileostomy;  Will check CBC and CMET today Will obtain CT abd with contrast Will forward to Dr. Lucretia Roers and Dr. Jess Barters Continue your nausea medication and pain medicine Continue to drink your ensure and get IV fluids as ordered by Dr. Jess Barters  Will follow up with you after CT Scan

## 2014-02-04 NOTE — Progress Notes (Signed)
Pre visit review using our clinic review tool, if applicable. No additional management support is needed unless otherwise documented below in the visit note. 

## 2014-02-05 ENCOUNTER — Encounter: Payer: Self-pay | Admitting: Internal Medicine

## 2014-02-05 ENCOUNTER — Telehealth: Payer: Self-pay

## 2014-02-05 LAB — COMPREHENSIVE METABOLIC PANEL
ALT: 13 U/L (ref 0–35)
AST: 19 U/L (ref 0–37)
Albumin: 3.6 g/dL (ref 3.5–5.2)
Alkaline Phosphatase: 107 U/L (ref 39–117)
BUN: 6 mg/dL (ref 6–23)
CHLORIDE: 104 meq/L (ref 96–112)
CO2: 29 mEq/L (ref 19–32)
Calcium: 8.6 mg/dL (ref 8.4–10.5)
Creatinine, Ser: 0.7 mg/dL (ref 0.4–1.2)
GFR: 89.84 mL/min (ref >60.00)
Glucose, Bld: 100 mg/dL — ABNORMAL HIGH (ref 70–99)
POTASSIUM: 4.1 meq/L (ref 3.5–5.1)
SODIUM: 137 meq/L (ref 135–145)
Total Bilirubin: 0.3 mg/dL (ref 0.2–1.2)
Total Protein: 7.1 g/dL (ref 6.0–8.3)

## 2014-02-05 LAB — CBC
HCT: 36.6 % (ref 36.0–46.0)
Hemoglobin: 12.2 g/dL (ref 12.0–15.0)
MCHC: 33.3 g/dL (ref 30.0–36.0)
MCV: 81.3 fl (ref 78.0–100.0)
Platelets: 300 10*3/uL (ref 150.0–400.0)
RBC: 4.51 Mil/uL (ref 3.87–5.11)
RDW: 14.8 % (ref 11.5–15.5)
WBC: 10.1 10*3/uL (ref 4.0–10.5)

## 2014-02-05 NOTE — Telephone Encounter (Signed)
We can definitely get a CT scan of the chest to assess the nodules. That is not the cause of her pain but we need to wait a few days until the contrast from yesterdays scan gets completely out of her system

## 2014-02-05 NOTE — Telephone Encounter (Signed)
Mr Deamer called and request phone call rather than mychart message re: CT scan; Mr Grosso wants to know if pt needs CT of Chest scheduled.

## 2014-02-06 ENCOUNTER — Telehealth: Payer: Self-pay | Admitting: Internal Medicine

## 2014-02-06 ENCOUNTER — Encounter: Payer: Self-pay | Admitting: Internal Medicine

## 2014-02-06 NOTE — Telephone Encounter (Signed)
Pt called and was not feeling any better, spoke with Nicki Reaper, she recommended that she go to ER.  Pt informed, she will go when husband arrives.

## 2014-02-08 ENCOUNTER — Encounter: Payer: Self-pay | Admitting: Internal Medicine

## 2014-02-09 ENCOUNTER — Ambulatory Visit: Payer: Self-pay | Admitting: Internal Medicine

## 2014-02-09 NOTE — Telephone Encounter (Signed)
Pt wanted to know if she could just get the CT ordered without another OV as they have so much going on with moving--pt is aware she will receive a call in reference to making appt if you approve--so i canceled her appt today as you stated you would order CT

## 2014-02-09 NOTE — Telephone Encounter (Signed)
Pt has f/u appt today to discuss

## 2014-02-10 ENCOUNTER — Other Ambulatory Visit: Payer: Self-pay | Admitting: Internal Medicine

## 2014-02-10 DIAGNOSIS — R9389 Abnormal findings on diagnostic imaging of other specified body structures: Secondary | ICD-10-CM

## 2014-02-10 NOTE — Telephone Encounter (Signed)
Ct ordered

## 2014-02-11 ENCOUNTER — Other Ambulatory Visit: Payer: Self-pay

## 2014-02-11 ENCOUNTER — Encounter: Payer: Self-pay | Admitting: Internal Medicine

## 2014-02-11 ENCOUNTER — Ambulatory Visit (INDEPENDENT_AMBULATORY_CARE_PROVIDER_SITE_OTHER)
Admission: RE | Admit: 2014-02-11 | Discharge: 2014-02-11 | Disposition: A | Discharge status: Home / Self Care | Payer: 59 | Source: Ambulatory Visit | Attending: Internal Medicine | Admitting: Internal Medicine

## 2014-02-11 DIAGNOSIS — R9389 Abnormal findings on diagnostic imaging of other specified body structures: Secondary | ICD-10-CM

## 2014-02-11 MED ORDER — IOHEXOL 300 MG/ML  SOLN
80.0000 mL | Freq: Once | INTRAMUSCULAR | Status: AC | PRN
  Administered 2014-02-11: 80 mL via INTRAVENOUS

## 2014-02-12 ENCOUNTER — Other Ambulatory Visit: Payer: Self-pay | Admitting: Internal Medicine

## 2014-02-12 MED ORDER — LEVOFLOXACIN 500 MG PO TABS: 500.0000 mg | ORAL_TABLET | Freq: Every day | ORAL | Status: DC

## 2014-02-16 ENCOUNTER — Encounter: Payer: Self-pay | Admitting: Internal Medicine

## 2014-02-18 ENCOUNTER — Encounter: Payer: Self-pay | Admitting: Internal Medicine

## 2014-02-18 ENCOUNTER — Other Ambulatory Visit: Payer: Self-pay

## 2014-02-18 MED ORDER — TAPENTADOL HCL 100 MG PO TABS: 1.0000 | ORAL_TABLET | ORAL | Status: DC

## 2014-02-19 ENCOUNTER — Telehealth: Payer: Self-pay | Admitting: Internal Medicine

## 2014-02-19 ENCOUNTER — Encounter: Payer: Self-pay | Admitting: Internal Medicine

## 2014-02-19 NOTE — Telephone Encounter (Signed)
Pts husband advised that pharmacy has been changed [address] [facility] [phone]  Store [phone]  [address] OF [address]

## 2014-02-22 ENCOUNTER — Encounter: Payer: Self-pay | Admitting: Internal Medicine

## 2014-02-23 ENCOUNTER — Encounter: Payer: Self-pay | Admitting: Internal Medicine

## 2014-02-24 ENCOUNTER — Other Ambulatory Visit: Payer: Self-pay | Admitting: Internal Medicine

## 2014-02-24 MED ORDER — LEVOFLOXACIN 500 MG PO TABS: 500.0000 mg | ORAL_TABLET | Freq: Every day | ORAL | Status: DC

## 2014-02-25 ENCOUNTER — Telehealth: Payer: Self-pay | Admitting: Internal Medicine

## 2014-02-25 NOTE — Telephone Encounter (Signed)
Please refer to previous message with response to original note

## 2014-02-25 NOTE — Telephone Encounter (Signed)
Larita FifeLynn with [facility] calling to update you on the pt. Pt is having right lung pain, lobe is diminished. Pt is on antibotics. Pt did experience fever 102.8, she took tylenol, fever came down. Fever is back up as of now at 100.6, sharpe stabbing pain in on right side, especially with deep breathes. They can do a mobile chest x-ray, Larita FifeLynn would need an order. Pt states both daughters do have pneumonia, chest? Pt really does not want go out. Please advise. Larita FifeLynn may be reached at [phone]. Thank you!

## 2014-02-25 NOTE — Telephone Encounter (Signed)
Spoke to pt and she is aware as instructed and she states she will let her nurse Larita FifeLynn know as well--

## 2014-02-25 NOTE — Telephone Encounter (Signed)
Please call patient about message she left earlier.

## 2014-02-25 NOTE — Telephone Encounter (Signed)
Chest xray is not going to show anything different. The patient already has a known pneumonia and is getting treatment. If pain is worse, or continues to spike fevers despite abx, she may have a resistant infection and may need IV antibiotics. I would say that if she truly feels she is getting worse, she should go to ER.

## 2014-02-25 NOTE — Telephone Encounter (Signed)
lmovm for Judy Gibbs to return my call--will call pt

## 2014-02-26 ENCOUNTER — Encounter: Payer: Self-pay | Admitting: Internal Medicine

## 2014-02-27 ENCOUNTER — Encounter: Payer: Self-pay | Admitting: Internal Medicine

## 2014-03-11 ENCOUNTER — Encounter: Payer: Self-pay | Admitting: Internal Medicine

## 2014-03-11 ENCOUNTER — Other Ambulatory Visit: Payer: Self-pay

## 2014-03-11 ENCOUNTER — Other Ambulatory Visit: Payer: Self-pay | Admitting: Internal Medicine

## 2014-03-11 MED ORDER — ALPRAZOLAM 1 MG PO TABS: 1.0000 mg | ORAL_TABLET | Freq: Every evening | ORAL | Status: DC | PRN

## 2014-03-11 NOTE — Telephone Encounter (Signed)
[place] Nicki [facility]regina [place]ity [place] [facility] changing from #30 to #45-- Rx called into pharmacy

## 2014-03-14 DIAGNOSIS — F419 Anxiety disorder, unspecified: Secondary | ICD-10-CM | POA: Insufficient documentation

## 2014-03-16 DIAGNOSIS — E46 Unspecified protein-calorie malnutrition: Secondary | ICD-10-CM | POA: Insufficient documentation

## 2014-03-18 ENCOUNTER — Encounter: Payer: Self-pay | Admitting: Internal Medicine

## 2014-03-26 ENCOUNTER — Encounter: Payer: Self-pay | Admitting: Internal Medicine

## 2014-03-27 MED ORDER — TAPENTADOL HCL 100 MG PO TABS: 1.0000 | ORAL_TABLET | ORAL | Status: DC

## 2014-03-27 NOTE — Telephone Encounter (Signed)
Pt request status of nucynta rx to be picked up. Do not see at front desk for pick up. Pt request cb at [phone] or can email pt when rx ready for pick up.Needs to pick up today.

## 2014-03-27 NOTE — Telephone Encounter (Signed)
Rx printed and waiting for your signature--[facility] and i advised her Rx would be ready for pick up

## 2014-03-27 NOTE — Telephone Encounter (Signed)
Pt left v/m when rx ready for pick up call pts husband [phone].

## 2014-04-02 ENCOUNTER — Encounter: Payer: Self-pay | Admitting: Internal Medicine

## 2014-04-03 ENCOUNTER — Encounter: Payer: Self-pay | Admitting: Internal Medicine

## 2014-04-03 NOTE — Telephone Encounter (Signed)
Mr Judy Gibbs left v/m  Requesting cb [phone] about email pt sent to Judy Reaperegina Baity NP earlier today. pts temp now is 101 and concerned about pneumonia. Judy Gibbs CMA advised Judy Reaperegina Baity NP did get email and will respond.

## 2014-04-04 ENCOUNTER — Ambulatory Visit: Payer: Self-pay | Admitting: Physician Assistant

## 2014-04-05 ENCOUNTER — Ambulatory Visit: Payer: Self-pay | Admitting: Family Medicine

## 2014-04-06 ENCOUNTER — Encounter: Payer: Self-pay | Admitting: Internal Medicine

## 2014-04-08 ENCOUNTER — Encounter: Payer: Self-pay | Admitting: Internal Medicine

## 2014-04-08 ENCOUNTER — Other Ambulatory Visit: Payer: Self-pay | Admitting: Internal Medicine

## 2014-04-08 NOTE — Telephone Encounter (Signed)
Last filled 03/11/2014--okay to call in on 03/10/14?--please advise

## 2014-04-08 NOTE — Telephone Encounter (Signed)
Ok to phone in  Xanax 11/20

## 2014-04-09 ENCOUNTER — Emergency Department: Payer: Self-pay | Admitting: Emergency Medicine

## 2014-04-09 ENCOUNTER — Ambulatory Visit: Payer: Self-pay | Admitting: Internal Medicine

## 2014-04-09 LAB — CBC
HCT: 29 % — ABNORMAL LOW (ref 35.0–47.0)
HGB: 9.5 g/dL — AB (ref 12.0–16.0)
MCH: 25.1 pg — AB (ref 26.0–34.0)
MCHC: 32.8 g/dL (ref 32.0–36.0)
MCV: 76 fL — ABNORMAL LOW (ref 80–100)
Platelet: 316 10*3/uL (ref 150–440)
RBC: 3.79 10*6/uL — AB (ref 3.80–5.20)
RDW: 17.4 % — ABNORMAL HIGH (ref 11.5–14.5)
WBC: 5.2 10*3/uL (ref 3.6–11.0)

## 2014-04-09 LAB — COMPREHENSIVE METABOLIC PANEL
Albumin: 2.6 g/dL — ABNORMAL LOW (ref 3.4–5.0)
Alkaline Phosphatase: 114 U/L
Anion Gap: 7 (ref 7–16)
BILIRUBIN TOTAL: 0.2 mg/dL (ref 0.2–1.0)
BUN: 5 mg/dL — AB (ref 7–18)
CALCIUM: 8.2 mg/dL — AB (ref 8.5–10.1)
CO2: 25 mmol/L (ref 21–32)
CREATININE: 1.07 mg/dL (ref 0.60–1.30)
Chloride: 109 mmol/L — ABNORMAL HIGH (ref 98–107)
EGFR (Non-African Amer.): 58 — ABNORMAL LOW
GLUCOSE: 100 mg/dL — AB (ref 65–99)
OSMOLALITY: 279 (ref 275–301)
Potassium: 3.6 mmol/L (ref 3.5–5.1)
SGOT(AST): 18 U/L (ref 15–37)
SGPT (ALT): 13 U/L — ABNORMAL LOW
Sodium: 141 mmol/L (ref 136–145)
Total Protein: 7 g/dL (ref 6.4–8.2)

## 2014-04-09 LAB — TROPONIN I: Troponin-I: <0.02 ng/mL

## 2014-04-10 NOTE — Telephone Encounter (Signed)
Rx called in to pharmacy. 

## 2014-04-13 ENCOUNTER — Telehealth: Payer: Self-pay | Admitting: Internal Medicine
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
## 2014-04-14 ENCOUNTER — Ambulatory Visit (INDEPENDENT_AMBULATORY_CARE_PROVIDER_SITE_OTHER): Payer: 59 | Admitting: Internal Medicine

## 2014-04-14 ENCOUNTER — Encounter: Payer: Self-pay | Admitting: Internal Medicine

## 2014-04-14 ENCOUNTER — Ambulatory Visit: Payer: Self-pay | Admitting: Internal Medicine

## 2014-04-14 VITALS — BP 112/68 | HR 108 | Temp 98.3°F | Wt 185.0 lb

## 2014-04-14 DIAGNOSIS — J189 Pneumonia, unspecified organism: Secondary | ICD-10-CM

## 2014-04-14 DIAGNOSIS — T2134XD Burn of third degree of lower back, subsequent encounter: Secondary | ICD-10-CM

## 2014-04-14 NOTE — Progress Notes (Signed)
Pre visit review using our clinic review tool, if applicable. No additional management support is needed unless otherwise documented below in the visit note. 

## 2014-04-14 NOTE — Patient Instructions (Signed)
Burn Care Your skin is a natural barrier to infection. It is the largest organ of your body. Burns damage this natural protection. To help prevent infection, it is very important to follow your caregiver's instructions in the care of your burn. Burns are classified as:  First degree. There is only redness of the skin (erythema). No scarring is expected.  Second degree. There is blistering of the skin. Scarring may occur with deeper burns.  Third degree. All layers of the skin are injured, and scarring is expected. HOME CARE INSTRUCTIONS   Wash your hands well before changing your bandage.  Change your bandage as often as directed by your caregiver.  Remove the old bandage. If the bandage sticks, you may soak it off with cool, clean water.  Cleanse the burn thoroughly but gently with mild soap and water.  Pat the area dry with a clean, dry cloth.  Apply a thin layer of antibacterial cream to the burn.  Apply a clean bandage as instructed by your caregiver.  Keep the bandage as clean and dry as possible.  Elevate the affected area for the first 24 hours, then as instructed by your caregiver.  Only take over-the-counter or prescription medicines for pain, discomfort, or fever as directed by your caregiver. SEEK IMMEDIATE MEDICAL CARE IF:   You develop excessive pain.  You develop redness, tenderness, swelling, or red streaks near the burn.  The burned area develops yellowish-white fluid (pus) or a bad smell.  You have a fever. MAKE SURE YOU:   Understand these instructions.  Will watch your condition.  Will get help right away if you are not doing well or get worse. Document Released: 05/08/2005 Document Revised: 07/31/2011 Document Reviewed: 09/28/2010 ExitCare Patient Information 2015 [employer]. This information is not intended to replace advice given to you by your health care provider. Make sure you discuss any questions you have with your health care  provider.  
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
## 2014-04-22 ENCOUNTER — Encounter: Payer: Self-pay | Admitting: Internal Medicine

## 2014-04-22 ENCOUNTER — Other Ambulatory Visit: Payer: Self-pay | Admitting: Internal Medicine

## 2014-04-22 MED ORDER — LEVOFLOXACIN 750 MG PO TABS: 750.0000 mg | ORAL_TABLET | Freq: Every day | ORAL | Status: DC

## 2014-04-25 ENCOUNTER — Encounter: Payer: Self-pay | Admitting: Internal Medicine

## 2014-04-26 ENCOUNTER — Encounter: Payer: Self-pay | Admitting: Internal Medicine

## 2014-04-27 ENCOUNTER — Other Ambulatory Visit: Payer: Self-pay | Admitting: Internal Medicine

## 2014-04-27 MED ORDER — TAPENTADOL HCL 100 MG PO TABS: 1.0000 | ORAL_TABLET | ORAL | Status: DC

## 2014-04-28 ENCOUNTER — Encounter: Payer: Self-pay | Admitting: Internal Medicine

## 2014-04-29 ENCOUNTER — Other Ambulatory Visit: Payer: Self-pay | Admitting: Internal Medicine

## 2014-04-29 MED ORDER — SILVER SULFADIAZINE 1 % EX CREA: 1.0000 "application " | TOPICAL_CREAM | Freq: Two times a day (BID) | CUTANEOUS | Status: DC

## 2014-05-07 ENCOUNTER — Other Ambulatory Visit: Payer: Self-pay | Admitting: Internal Medicine

## 2014-05-07 NOTE — Telephone Encounter (Signed)
Ok to phone in xanax to be filled on or after 05/08/14

## 2014-05-07 NOTE — Telephone Encounter (Signed)
last filled 04/08/14--please advise

## 2014-05-08 ENCOUNTER — Encounter: Payer: Self-pay | Admitting: Internal Medicine

## 2014-05-08 NOTE — Telephone Encounter (Signed)
Rx called in to pharmacy. 

## 2014-05-13 ENCOUNTER — Encounter: Payer: Self-pay | Admitting: Internal Medicine

## 2014-05-19 ENCOUNTER — Encounter: Payer: Self-pay | Admitting: Internal Medicine

## 2014-05-21 ENCOUNTER — Telehealth: Payer: Self-pay

## 2014-05-21 NOTE — Telephone Encounter (Signed)
Pt left v/m; pt request Ct report sent to Dr Meredeth IdeFleming earlier this week to be resent with cover sheet indicating pts name and DOB and info needed to go to Dr Meredeth IdeFleming in pulmonology. Pt also request cb to [phone].

## 2014-05-25 NOTE — Telephone Encounter (Signed)
CT faxed to [facility] dr Meredeth Ide

## 2014-05-26 ENCOUNTER — Other Ambulatory Visit: Payer: Self-pay

## 2014-05-26 DIAGNOSIS — Z1231 Encounter for screening mammogram for malignant neoplasm of breast: Secondary | ICD-10-CM

## 2014-05-27 ENCOUNTER — Telehealth: Payer: Self-pay | Admitting: Internal Medicine

## 2014-05-27 NOTE — Telephone Encounter (Signed)
Pt husband is calling in this morning to request a RX for O2 due to "her being in a lot of pain and gasping for breath". He said that he was able to give her some O2 last time and it seemed to help quiet a bit. He is requesting a call back regarding this.

## 2014-05-27 NOTE — Telephone Encounter (Signed)
They need to contact the pulmonologist

## 2014-05-28 NOTE — Telephone Encounter (Signed)
Pt is aware and she states she has an appt with Pulm next week--she will discuss then

## 2014-05-29 ENCOUNTER — Encounter: Payer: Self-pay | Admitting: Internal Medicine

## 2014-05-29 ENCOUNTER — Ambulatory Visit (INDEPENDENT_AMBULATORY_CARE_PROVIDER_SITE_OTHER): Payer: 59 | Admitting: Internal Medicine

## 2014-05-29 VITALS — BP 134/78 | HR 103 | Temp 97.8°F

## 2014-05-29 DIAGNOSIS — T2134XD Burn of third degree of lower back, subsequent encounter: Secondary | ICD-10-CM

## 2014-05-29 DIAGNOSIS — J189 Pneumonia, unspecified organism: Secondary | ICD-10-CM

## 2014-05-29 DIAGNOSIS — R079 Chest pain, unspecified: Secondary | ICD-10-CM

## 2014-05-29 MED ORDER — TAPENTADOL HCL 100 MG PO TABS: 1.0000 | ORAL_TABLET | ORAL | Status: DC

## 2014-05-29 NOTE — Patient Instructions (Signed)
Burn Care Your skin is a natural barrier to infection. It is the largest organ of your body. Burns damage this natural protection. To help prevent infection, it is very important to follow your caregiver's instructions in the care of your burn. Burns are classified as:  First degree. There is only redness of the skin (erythema). No scarring is expected.  Second degree. There is blistering of the skin. Scarring may occur with deeper burns.  Third degree. All layers of the skin are injured, and scarring is expected. HOME CARE INSTRUCTIONS   Wash your hands well before changing your bandage.  Change your bandage as often as directed by your caregiver.  Remove the old bandage. If the bandage sticks, you may soak it off with cool, clean water.  Cleanse the burn thoroughly but gently with mild soap and water.  Pat the area dry with a clean, dry cloth.  Apply a thin layer of antibacterial cream to the burn.  Apply a clean bandage as instructed by your caregiver.  Keep the bandage as clean and dry as possible.  Elevate the affected area for the first 24 hours, then as instructed by your caregiver.  Only take over-the-counter or prescription medicines for pain, discomfort, or fever as directed by your caregiver. SEEK IMMEDIATE MEDICAL CARE IF:   You develop excessive pain.  You develop redness, tenderness, swelling, or red streaks near the burn.  The burned area develops yellowish-white fluid (pus) or a bad smell.  You have a fever. MAKE SURE YOU:   Understand these instructions.  Will watch your condition.  Will get help right away if you are not doing well or get worse. Document Released: 05/08/2005 Document Revised: 07/31/2011 Document Reviewed: 09/28/2010 ExitCare Patient Information 2015 [employer]. This information is not intended to replace advice given to you by your health care provider. Make sure you discuss any questions you have with your health care  provider.  
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
## 2014-05-29 NOTE — Progress Notes (Signed)
Pre visit review using our clinic review tool, if applicable. No additional management support is needed unless otherwise documented below in the visit note. 

## 2014-05-30 ENCOUNTER — Encounter: Payer: Self-pay | Admitting: Internal Medicine

## 2014-05-30 ENCOUNTER — Ambulatory Visit: Payer: Self-pay | Admitting: Physician Assistant

## 2014-05-30 ENCOUNTER — Ambulatory Visit: Payer: Self-pay

## 2014-05-30 LAB — URINALYSIS, COMPLETE
Bacteria: NEGATIVE
Bilirubin,UR: NEGATIVE
GLUCOSE, UR: NEGATIVE
Ketone: 15
LEUKOCYTE ESTERASE: NEGATIVE
NITRITE: NEGATIVE
Ph: 6 (ref 5.0–8.0)
Protein: NEGATIVE
Specific Gravity: 1.015 (ref 1.000–1.030)

## 2014-05-30 LAB — COMPREHENSIVE METABOLIC PANEL
ANION GAP: 8 (ref 7–16)
Albumin: 3.1 g/dL — ABNORMAL LOW (ref 3.4–5.0)
Alkaline Phosphatase: 105 U/L
BILIRUBIN TOTAL: 0.5 mg/dL (ref 0.2–1.0)
BUN: 10 mg/dL (ref 7–18)
CHLORIDE: 101 mmol/L (ref 98–107)
CO2: 26 mmol/L (ref 21–32)
CREATININE: 0.98 mg/dL (ref 0.60–1.30)
Calcium, Total: 8.8 mg/dL (ref 8.5–10.1)
EGFR (Non-African Amer.): >60
Glucose: 92 mg/dL (ref 65–99)
Osmolality: 269 (ref 275–301)
POTASSIUM: 3.3 mmol/L — AB (ref 3.5–5.1)
SGOT(AST): 15 U/L (ref 15–37)
SGPT (ALT): 18 U/L
Sodium: 135 mmol/L — ABNORMAL LOW (ref 136–145)
TOTAL PROTEIN: 7.1 g/dL (ref 6.4–8.2)

## 2014-05-30 LAB — CBC WITH DIFFERENTIAL/PLATELET
Basophil #: 0.1 10*3/uL (ref 0.0–0.1)
Basophil %: 0.7 %
EOS PCT: 2.9 %
Eosinophil #: 0.2 10*3/uL (ref 0.0–0.7)
HCT: 30.6 % — ABNORMAL LOW (ref 35.0–47.0)
HGB: 10 g/dL — ABNORMAL LOW (ref 12.0–16.0)
LYMPHS ABS: 1.3 10*3/uL (ref 1.0–3.6)
LYMPHS PCT: 16.6 %
MCH: 24.5 pg — ABNORMAL LOW (ref 26.0–34.0)
MCHC: 32.8 g/dL (ref 32.0–36.0)
MCV: 75 fL — AB (ref 80–100)
MONOS PCT: 6.1 %
Monocyte #: 0.5 x10 3/mm (ref 0.2–0.9)
Neutrophil #: 5.9 10*3/uL (ref 1.4–6.5)
Neutrophil %: 73.7 %
PLATELETS: 319 10*3/uL (ref 150–440)
RBC: 4.11 10*6/uL (ref 3.80–5.20)
RDW: 19.9 % — AB (ref 11.5–14.5)
WBC: 8.1 10*3/uL (ref 3.6–11.0)

## 2014-05-31 ENCOUNTER — Inpatient Hospital Stay: Payer: Self-pay | Admitting: Internal Medicine

## 2014-05-31 LAB — COMPREHENSIVE METABOLIC PANEL
ALK PHOS: 127 U/L — AB
ALT: 19 U/L
AST: 20 U/L (ref 15–37)
Albumin: 3.2 g/dL — ABNORMAL LOW (ref 3.4–5.0)
Anion Gap: 8 (ref 7–16)
BILIRUBIN TOTAL: 0.3 mg/dL (ref 0.2–1.0)
BUN: 8 mg/dL (ref 7–18)
CHLORIDE: 105 mmol/L (ref 98–107)
CREATININE: 1.01 mg/dL (ref 0.60–1.30)
Calcium, Total: 8.7 mg/dL (ref 8.5–10.1)
Co2: 25 mmol/L (ref 21–32)
EGFR (Non-African Amer.): >60
Glucose: 114 mg/dL — ABNORMAL HIGH (ref 65–99)
Osmolality: 275 (ref 275–301)
Potassium: 3.3 mmol/L — ABNORMAL LOW (ref 3.5–5.1)
Sodium: 138 mmol/L (ref 136–145)
Total Protein: 7.9 g/dL (ref 6.4–8.2)

## 2014-05-31 LAB — CBC WITH DIFFERENTIAL/PLATELET
Basophil #: 0.1 10*3/uL (ref 0.0–0.1)
Basophil %: 0.6 %
EOS PCT: 5.1 %
Eosinophil #: 0.5 10*3/uL (ref 0.0–0.7)
HCT: 32.5 % — ABNORMAL LOW (ref 35.0–47.0)
HGB: 11 g/dL — ABNORMAL LOW (ref 12.0–16.0)
LYMPHS ABS: 1.6 10*3/uL (ref 1.0–3.6)
Lymphocyte %: 16 %
MCH: 25.5 pg — ABNORMAL LOW (ref 26.0–34.0)
MCHC: 34 g/dL (ref 32.0–36.0)
MCV: 75 fL — ABNORMAL LOW (ref 80–100)
Monocyte #: 0.7 x10 3/mm (ref 0.2–0.9)
Monocyte %: 7.3 %
NEUTROS PCT: 71 %
Neutrophil #: 7.1 10*3/uL — ABNORMAL HIGH (ref 1.4–6.5)
Platelet: 358 10*3/uL (ref 150–440)
RBC: 4.32 10*6/uL (ref 3.80–5.20)
RDW: 20 % — AB (ref 11.5–14.5)
WBC: 10.1 10*3/uL (ref 3.6–11.0)

## 2014-06-01 ENCOUNTER — Telehealth: Payer: Self-pay

## 2014-06-01 LAB — CBC WITH DIFFERENTIAL/PLATELET
Basophil #: 0 10*3/uL (ref 0.0–0.1)
Basophil %: 0.5 %
EOS ABS: 0.1 10*3/uL (ref 0.0–0.7)
Eosinophil %: 1.9 %
HCT: 27.6 % — AB (ref 35.0–47.0)
HGB: 9.3 g/dL — ABNORMAL LOW (ref 12.0–16.0)
Lymphocyte #: 1.9 10*3/uL (ref 1.0–3.6)
Lymphocyte %: 24 %
MCH: 25 pg — ABNORMAL LOW (ref 26.0–34.0)
MCHC: 33.6 g/dL (ref 32.0–36.0)
MCV: 75 fL — ABNORMAL LOW (ref 80–100)
MONOS PCT: 8.2 %
Monocyte #: 0.6 x10 3/mm (ref 0.2–0.9)
NEUTROS ABS: 5.1 10*3/uL (ref 1.4–6.5)
NEUTROS PCT: 65.4 %
Platelet: 303 10*3/uL (ref 150–440)
RBC: 3.7 10*6/uL — ABNORMAL LOW (ref 3.80–5.20)
RDW: 19.7 % — ABNORMAL HIGH (ref 11.5–14.5)
WBC: 7.7 10*3/uL (ref 3.6–11.0)

## 2014-06-01 LAB — BASIC METABOLIC PANEL
Anion Gap: 9 (ref 7–16)
BUN: 5 mg/dL — ABNORMAL LOW (ref 7–18)
CALCIUM: 8.1 mg/dL — AB (ref 8.5–10.1)
CO2: 23 mmol/L (ref 21–32)
Chloride: 109 mmol/L — ABNORMAL HIGH (ref 98–107)
Creatinine: 0.79 mg/dL (ref 0.60–1.30)
EGFR (African American): >60
EGFR (Non-African Amer.): >60
GLUCOSE: 98 mg/dL (ref 65–99)
Osmolality: 278 (ref 275–301)
Potassium: 3.3 mmol/L — ABNORMAL LOW (ref 3.5–5.1)
Sodium: 141 mmol/L (ref 136–145)

## 2014-06-01 NOTE — Telephone Encounter (Signed)
The port could be the problem. Probably no way to tell though without having blood cultures done. These are usually done inpatient at the hospital.

## 2014-06-01 NOTE — Telephone Encounter (Signed)
PLEASE NOTE: All timestamps contained within this report are represented as [place] Standard Time. CONFIDENTIALTY NOTICE: This fax transmission is intended only for the addressee. It contains information that is legally privileged, confidential or otherwise protected from use or disclosure. If you are not the intended recipient, you are strictly prohibited from reviewing, disclosing, copying using or disseminating any of this information or taking any action in reliance on or regarding this information. If you have received this fax in error, please notify us immediately by telephone so that we can arrange for its return to us. Phone: [phone], Toll-Free: [phone], Fax: [phone] Page: 1 of 2 Call Id: [id number] [facility] - Client TELEPHONE ADVICE RECORD [facility] Patient Name: Judy BectonSHELIA Judy Gibbs Gender: Female DOB: 1965-11-09 Age: [age] Y 10 M 26 D Return Phone Number: [phone] (Primary) Address: [address] [place]State[place]Zip: [place] [id number] Client [facility] - [facility] Client Site  [facility] Night Physician Nicki ReaperBaity, Regina Contact Type Call Call Type Triage / Clinical Caller Name Jimmhy Relationship To Patient Spouse Return Phone Number [phone] (Primary) Chief Complaint Fever (non urgent symptom) Initial Comment Caller state his wife is a patient was seen in the office today pneumonia history for past 5 months. She has a fever 101.5 she has been on RX since Sept with hospital stays. PreDisposition Home Care Nurse Assessment Nurse: Orson AloeHenderson, RN, Morley KosGeorgeanne Date/Time Lamount Cohen(Eastern Time): 05/29/2014 7:31:03 PM Confirm and document reason for call. If symptomatic, describe symptoms. ---husband states pt has had pneumonia since september. been on several different abx. seeing pulmonologist. took last abx today and now has temp of 101.5. has portacath. scheduled to take  port out. . no cough. wonders if port is problem. Has the patient traveled out of the country within the last 30 days? ---No Does the patient require triage? ---Yes Related visit to physician within the last 2 weeks? ---Yes Does the PT have any chronic conditions? (i.e. diabetes, asthma, etc.) ---Yes List chronic conditions. ---LES, illestomy, pelvic floor dysfunction. Did the patient indicate they were pregnant? ---No Guidelines Guideline Title Affirmed Question Affirmed Notes Nurse Date/Time (Eastern Time) Fever [1] Fever > 100.5 F (38.1 C) AND [2] has port (portacath), central line, or PICC line Orson AloeHenderson, RN, Morley KosGeorgeanne 05/29/2014 7:38:13 PM Disp. Time Lamount Cohen(Eastern Time) Disposition Final User 05/29/2014 7:50:20 PM See Physician within 4 Hours (or PCP triage) Yes Orson AloeHenderson, RN, Morley KosGeorgeanne PLEASE NOTE: All timestamps contained within this report are represented as [place] Standard Time. CONFIDENTIALTY NOTICE: This fax transmission is intended only for the addressee. It contains information that is legally privileged, confidential or otherwise protected from use or disclosure. If you are not the intended recipient, you are strictly prohibited from reviewing, disclosing, copying using or disseminating any of this information or taking any action in reliance on or regarding this information. If you have received this fax in error, please notify us immediately by telephone so that we can arrange for its return to us. Phone: [phone], Toll-Free: [phone], Fax: [phone] Page: 2 of 2 Call Id: [id number] Caller Understands: Yes Disagree/Comply: Disagree Disagree/Comply Reason: Wait and see Care Advice Given Per Guideline SEE PHYSICIAN WITHIN 4 HOURS (or PCP triage): * IF NO PCP TRIAGE: You need to be seen. Go to _______________ (ED/ [facility] or office if it will be open) within the next 3 or 4 hours. Go sooner if you become worse. FOR ALL FEVERS: * Drink cold fluids to prevent  dehydration. * Dress in  1 layer of lightweight clothing and sleep with 1 light blanket. * For fevers less than 101 F (38.3 C), fever medicines are usually not necessary. CALL BACK IF: * You become worse. CARE ADVICE given per Fever (Adult) guideline. After Care Instructions Given Call Event Type User Date / Time Description

## 2014-06-02 LAB — POTASSIUM: Potassium: 3.5 mmol/L (ref 3.5–5.1)

## 2014-06-02 LAB — URINE CULTURE

## 2014-06-02 NOTE — Telephone Encounter (Signed)
Not sure what to do with this feedback other than forward to [facility]

## 2014-06-02 NOTE — Telephone Encounter (Signed)
Spoke to patient--pt is currently admitted to [facility]--pt states she was told she had yeast in port and still have bilateral pneumonia---however pt states she was concerned with speaking to triage--[facility]--Pt states her husband was told to "not give her Tylenol as her body needs to fight infection" also "do not take to ED unless temperature is 105.0"--pt's husband also states he was told that the nurse he was [place] was from [place] and that was supposed to be a secret.

## 2014-06-04 LAB — COMPREHENSIVE METABOLIC PANEL
ANION GAP: 8 (ref 7–16)
Albumin: 2.6 g/dL — ABNORMAL LOW (ref 3.4–5.0)
Alkaline Phosphatase: 90 U/L
BUN: 3 mg/dL — AB (ref 7–18)
Bilirubin,Total: 0.2 mg/dL (ref 0.2–1.0)
CALCIUM: 8.1 mg/dL — AB (ref 8.5–10.1)
Chloride: 110 mmol/L — ABNORMAL HIGH (ref 98–107)
Co2: 25 mmol/L (ref 21–32)
Creatinine: 0.76 mg/dL (ref 0.60–1.30)
EGFR (African American): >60
EGFR (Non-African Amer.): >60
GLUCOSE: 93 mg/dL (ref 65–99)
Osmolality: 281 (ref 275–301)
POTASSIUM: 2.9 mmol/L — AB (ref 3.5–5.1)
SGOT(AST): 10 U/L — ABNORMAL LOW (ref 15–37)
SGPT (ALT): 13 U/L — ABNORMAL LOW
Sodium: 143 mmol/L (ref 136–145)
TOTAL PROTEIN: 6.3 g/dL — AB (ref 6.4–8.2)

## 2014-06-04 LAB — CULTURE, BLOOD (SINGLE)

## 2014-06-05 ENCOUNTER — Encounter: Payer: Self-pay | Admitting: Internal Medicine

## 2014-06-05 ENCOUNTER — Other Ambulatory Visit: Payer: Self-pay | Admitting: Internal Medicine

## 2014-06-05 ENCOUNTER — Telehealth: Payer: Self-pay

## 2014-06-05 LAB — CULTURE, BLOOD (SINGLE)

## 2014-06-05 NOTE — Telephone Encounter (Signed)
Pt left v/m; pt's pain is doing better and pt request to stop taking Nucynta 100 mg. Pt request med that is not opiate based; pt has previously taken tramadol and would like to restart taking tramadol since pt does not need strong pain med now. [facility] [place]. Pt request cb.

## 2014-06-05 NOTE — Telephone Encounter (Signed)
She was in pain last time I saw her. I think she should continue the Nucynta

## 2014-06-07 ENCOUNTER — Telehealth: Payer: Self-pay | Admitting: Internal Medicine

## 2014-06-07 ENCOUNTER — Encounter: Payer: Self-pay | Admitting: Internal Medicine

## 2014-06-07 DIAGNOSIS — R079 Chest pain, unspecified: Secondary | ICD-10-CM

## 2014-06-08 MED ORDER — TRAMADOL HCL 50 MG PO TABS: 50.0000 mg | ORAL_TABLET | Freq: Three times a day (TID) | ORAL | Status: DC | PRN

## 2014-06-08 NOTE — Telephone Encounter (Signed)
Sent msg in response to question via [employer] msg

## 2014-06-08 NOTE — Telephone Encounter (Signed)
Please phone in tramadol 

## 2014-06-08 NOTE — Telephone Encounter (Signed)
Rx called in to pharmacy. 

## 2014-06-09 LAB — WOUND CULTURE

## 2014-06-23 LAB — CULTURE, FUNGUS WITHOUT SMEAR

## 2014-06-25 ENCOUNTER — Encounter: Payer: Self-pay | Admitting: Internal Medicine

## 2014-06-25 ENCOUNTER — Other Ambulatory Visit: Payer: Self-pay | Admitting: Internal Medicine

## 2014-06-25 MED ORDER — ALPRAZOLAM 1 MG PO TABS: ORAL_TABLET | ORAL | Status: DC

## 2014-06-25 NOTE — Telephone Encounter (Signed)
Rx called in to pharmacy. 

## 2014-06-29 ENCOUNTER — Encounter: Payer: Self-pay | Admitting: Internal Medicine

## 2014-06-30 ENCOUNTER — Encounter: Payer: Self-pay | Admitting: Internal Medicine

## 2014-07-06 ENCOUNTER — Ambulatory Visit: Payer: Self-pay | Admitting: Internal Medicine

## 2014-07-29 ENCOUNTER — Telehealth: Payer: Self-pay

## 2014-07-29 NOTE — Telephone Encounter (Signed)
Judy Gibbs pts husband said pt has appt with Nicki Reaperegina Baity NP next week and Judy Gibbs needs to give [place] heads up on some issues. Judy Gibbs only wants to speak with Nicki Reaperegina Baity NP and request cb at [phone]. Judy Gibbs said he would be with his wife this afternoon on and off and request a text to [phone] and then he will cb to speak with Nicki Reaperegina Baity NP because he does not want to talk in front of his wife.

## 2014-07-29 NOTE — Telephone Encounter (Signed)
We can discuss things at his appt

## 2014-07-30 NOTE — Telephone Encounter (Signed)
Advised pt that Judy Gibbs would be unable to text him before OV--asked pt if he would like to give me the information and I can get the information to regina--pt refused and said "nevermind, will be okay"

## 2014-07-31 ENCOUNTER — Other Ambulatory Visit: Payer: Self-pay | Admitting: Internal Medicine

## 2014-07-31 NOTE — Telephone Encounter (Signed)
Last filled 06/25/2014--please advise 

## 2014-07-31 NOTE — Telephone Encounter (Signed)
Please phone in xanax 

## 2014-07-31 NOTE — Telephone Encounter (Signed)
Rx called in to pharmacy. 

## 2014-08-10 ENCOUNTER — Encounter: Payer: Self-pay | Admitting: Internal Medicine

## 2014-08-10 ENCOUNTER — Ambulatory Visit (INDEPENDENT_AMBULATORY_CARE_PROVIDER_SITE_OTHER): Payer: 59 | Admitting: Internal Medicine

## 2014-08-10 ENCOUNTER — Ambulatory Visit: Payer: Self-pay

## 2014-08-10 VITALS — BP 118/80 | HR 79 | Temp 98.4°F | Wt 180.0 lb

## 2014-08-10 DIAGNOSIS — K220 Achalasia of cardia: Secondary | ICD-10-CM

## 2014-08-10 DIAGNOSIS — R197 Diarrhea, unspecified: Secondary | ICD-10-CM

## 2014-08-10 DIAGNOSIS — K224 Dyskinesia of esophagus: Secondary | ICD-10-CM

## 2014-08-10 LAB — BASIC METABOLIC PANEL
BUN: 9 mg/dL (ref 6–23)
CALCIUM: 9.5 mg/dL (ref 8.4–10.5)
CHLORIDE: 104 meq/L (ref 96–112)
CO2: 29 mEq/L (ref 19–32)
Creatinine, Ser: 0.95 mg/dL (ref 0.40–1.20)
GFR: 66.15 mL/min (ref >60.00)
Glucose, Bld: 94 mg/dL (ref 70–99)
POTASSIUM: 4.1 meq/L (ref 3.5–5.1)
SODIUM: 138 meq/L (ref 135–145)

## 2014-08-10 LAB — MAGNESIUM: Magnesium: 1.9 mg/dL (ref 1.5–2.5)

## 2014-08-10 NOTE — Progress Notes (Signed)
Subjective:    Patient ID: Judy Gibbs, female    DOB: 03-20-1966, 49 y.o.   MRN: [id number]  HPI [name] is a 49 year old female who presents today wanting to discuss lab work. She was hospitalized in January at [facility] for a pneumonia and a yeast infection in her right port a cath.  Patient has a past medical history of multiple GI issues including a current ileostomy, hypertensive lower esophageal disorder, chronic gut motility disorder and pelvic floor dysfunction, and is followed by Dr. Chestine Sporelark at [facility].  She has a follow up appointment with Dr. Chestine Sporelark this week. She was told when she was hospitalized in January that her potassium and magnesium were low and would like to have her electrolytes checked.   She also complains of pain in her abdomen and the pain travels up to her esophagus.  She has tramadol that she can take but does wish to take it.  She also complains of nausea and vomiting intermittently and she has promethazine at home that she can take.     Review of Systems  Constitutional: Negative for fever, chills and fatigue.  HENT: Negative for congestion, postnasal drip, rhinorrhea and sinus pressure.   Respiratory: Negative for cough, shortness of breath and wheezing.   Cardiovascular: Negative for chest pain, palpitations and leg swelling.  Gastrointestinal: Positive for nausea (chronic), abdominal pain and diarrhea (reports large amounts of watery stool from ileostomy). Negative for constipation.  Genitourinary: Negative for dysuria, urgency and flank pain.  Musculoskeletal: Negative for back pain, joint swelling and gait problem.  Skin: Positive for wound. Negative for color change, pallor and rash.  Neurological: Negative for dizziness, light-headedness and headaches.   Past Medical History  Diagnosis Date  . Bipolar disorder   . DDD (degenerative disc disease), cervical   . Anxiety   . Depression   . Fibromyalgia   . GERD (gastroesophageal reflux disease)    . IBS (irritable bowel syndrome)   . Raynauds phenomenon   . Gastroparesis   . Colonic inertia   . Proctalgia   . Pelvic floor dysfunction   . Hypoglycemia   . Interstitial cystitis   . Obesity   . Vertigo    Family History  Problem Relation Age of Onset  . Colon polyps Paternal Uncle   . Esophageal cancer Paternal Uncle   . Diabetes Father   . Heart disease Father   . Stroke Father   . Hyperlipidemia Father   . Hyperlipidemia Mother   . Cancer Paternal Aunt     breast  . Early death Neg Hx    Current Outpatient Prescriptions on File Prior to Visit  Medication Sig Dispense Refill  . ALPRAZolam (XANAX) 1 MG tablet TAKE 1 TO 2 TABLETS BY MOUTH EVERY NIGHT AT BEDTIME AS NEEDED 45 tablet 0  . calcium-vitamin D (OSCAL) 250-125 MG-UNIT per tablet Take 2 tablets by mouth every morning.     . dronabinol (MARINOL) 10 MG capsule Take 10 mg by mouth daily as needed (nausea).     . Multiple Vitamins-Minerals (ADEKS) chewable tablet Chew 2 tablets by mouth every morning. Multivitamin gummie    . omeprazole (PRILOSEC) 40 MG capsule Take 40 mg by mouth 2 (two) times daily.    . promethazine (PHENERGAN) 6.25 MG/5ML syrup Take 20 mLs by mouth 4 (four) times daily as needed for nausea.    . sucralfate (CARAFATE) 1 GM/10ML suspension Take 1 g by mouth as needed.     .Marland Kitchen  traMADol (ULTRAM) 50 MG tablet Take 1 tablet (50 mg total) by mouth every 8 (eight) hours as needed. (Patient taking differently: Take 50 mg by mouth as needed. ) 90 tablet 0  . traZODone (DESYREL) 100 MG tablet Take 1 tablet (100 mg total) by mouth at bedtime. (Patient taking differently: Take 100 mg by mouth at bedtime as needed. ) 30 tablet 0   No current facility-administered medications on file prior to visit.       Objective:   Physical Exam  Constitutional: She is oriented to person, place, and time. She appears well-developed and well-nourished.  HENT:  Head: Normocephalic and atraumatic.  Right Ear: External ear  normal.  Left Ear: External ear normal.  Mouth/Throat: Oropharynx is clear and moist. No oropharyngeal exudate.  Neck: Normal range of motion. Neck supple. No JVD present.  Cardiovascular: Normal rate, regular rhythm, normal heart sounds and intact distal pulses.   No murmur heard. Pulmonary/Chest: Effort normal and breath sounds normal. She has no wheezes. She has no rales.  Abdominal: Soft. Bowel sounds are normal. She exhibits no distension. There is no tenderness.  Musculoskeletal: Normal range of motion.  Lymphadenopathy:    She has no cervical adenopathy.  Neurological: She is alert and oriented to person, place, and time.  Skin: Skin is warm and dry.  Has a healing burn from a heating pad on left side of back.    Psychiatric: She has a normal mood and affect.    BP 118/80 mmHg  Pulse 79  Temp(Src) 98.4 F (36.9 C) (Oral)  Wt 180 lb (81.647 kg)  SpO2 99%      Assessment & Plan:  1. Diarrhea from ileostomy Will draw BMP and magnesium today.  Keep follow up with GI doctor this week.

## 2014-08-10 NOTE — Progress Notes (Signed)
Subjective:    Patient ID: Judy Gibbs, female    DOB: [birth year], 49 y.o.   MRN: [id number]  HPI  Pt presents to the clinic today wanting to discuss lab work. She was hospitalized in January at [facility] for a pneumonia and a yeast infection in her right port a cath.  Patient has a past medical history of multiple GI issues including a current ileostomy, hypertensive lower esophageal disorder, chronic gut motility disorder and pelvic floor dysfunction, and is followed by Dr. Alycia Rossetti at [facility].  She has a follow up appointment with Dr. Alycia Rossetti this week. She was told when she was hospitalized in January that her potassium and magnesium were low and would like to have her electrolytes checked.   She also complains of pain in her abdomen and the pain travels up to her esophagus.  She has tramadol that she can take but does not wish to take it because she feels like it slows down her motiliy. She also complains of nausea and vomiting intermittently and she has promethazine at home that she can take.    Review of Systems      Past Medical History  Diagnosis Date  . Bipolar disorder   . DDD (degenerative disc disease), cervical   . Anxiety   . Depression   . Fibromyalgia   . GERD (gastroesophageal reflux disease)   . IBS (irritable bowel syndrome)   . Raynauds phenomenon   . Gastroparesis   . Colonic inertia   . Proctalgia   . Pelvic floor dysfunction   . Hypoglycemia   . Interstitial cystitis   . Obesity   . Vertigo     Current Outpatient Prescriptions  Medication Sig Dispense Refill  . ALPRAZolam (XANAX) 1 MG tablet TAKE 1 TO 2 TABLETS BY MOUTH EVERY NIGHT AT BEDTIME AS NEEDED 45 tablet 0  . calcium-vitamin D (OSCAL) 250-125 MG-UNIT per tablet Take 2 tablets by mouth every morning.     . dronabinol (MARINOL) 10 MG capsule Take 10 mg by mouth daily as needed (nausea).     . Multiple Vitamins-Minerals (ADEKS) chewable tablet Chew 2 tablets by mouth every morning. Multivitamin  gummie    . omeprazole (PRILOSEC) 40 MG capsule Take 40 mg by mouth 2 (two) times daily.    . promethazine (PHENERGAN) 6.25 MG/5ML syrup Take 20 mLs by mouth 4 (four) times daily as needed for nausea.    . sucralfate (CARAFATE) 1 GM/10ML suspension Take 1 g by mouth as needed.     . traMADol (ULTRAM) 50 MG tablet Take 1 tablet (50 mg total) by mouth every 8 (eight) hours as needed. (Patient taking differently: Take 50 mg by mouth as needed. ) 90 tablet 0  . traZODone (DESYREL) 100 MG tablet Take 1 tablet (100 mg total) by mouth at bedtime. (Patient taking differently: Take 100 mg by mouth at bedtime as needed. ) 30 tablet 0   No current facility-administered medications for this visit.    Allergies  Allergen Reactions  . Celecoxib     REACTION: hives  . Reglan [Metoclopramide] Anxiety    Family History  Problem Relation Age of Onset  . Colon polyps Paternal Uncle   . Esophageal cancer Paternal Uncle   . Diabetes Father   . Heart disease Father   . Stroke Father   . Hyperlipidemia Father   . Hyperlipidemia Mother   . Cancer Paternal Aunt     breast  . Early death Neg Hx  History   Social History  . Marital Status: Married    Spouse Name: N/A  . Number of Children: 2  . Years of Education: N/A   Occupational History  . homemaker    Social History Main Topics  . Smoking status: Former Smoker    Quit date: 05/22/1982  . Smokeless tobacco: Never Used  . Alcohol Use: No  . Drug Use: No  . Sexual Activity: Yes    Birth Control/ Protection: Surgical   Other Topics Concern  . Not on file   Social History Narrative     Constitutional: Pt reports fatigue. Denies fever, malaise, headache or abrupt weight changes.  r sputum production.   Cardiovascular: Denies chest pain, chest tightness, palpitations or swelling in the hands or feet.  Gastrointestinal: Pt reports epigastric pain and diarrhea from ileostoym. Denies constipation or blood in the stool.  Skin: Denies  redness, rashes, lesions or ulcercations.  Neurological: Denies dizziness, difficulty with memory, difficulty with speech or problems with balance and coordination.   No other specific complaints in a complete review of systems (except as listed in HPI above).  Objective:   Physical Exam  BP 118/80 mmHg  Pulse 79  Temp(Src) 98.4 F (36.9 C) (Oral)  Wt 180 lb (81.647 kg)  SpO2 99% Wt Readings from Last 3 Encounters:  08/10/14 180 lb (81.647 kg)  04/14/14 185 lb (83.915 kg)  01/21/14 197 lb (89.359 kg)    General: Appears her stated age, well developed, well nourished in NAD. Skin: Warm, dry and intact. No rashes, lesions or ulcerations noted. Cardiovascular: Normal rate and rhythm. S1,S2 noted.  No murmur, rubs or gallops noted. Pulmonary/Chest: Normal effort and positive vesicular breath sounds. No respiratory distress. No wheezes, rales or ronchi noted.  Abdomen: Soft and very tender in the epigastric area. Normal bowel sounds, no bruits noted. No distention or masses noted. Stoma looks good. Liquid in ileostomy bag looks normal. Neurological: Alert and oriented.  Psychiatric: She is very anxious.  BMET    Component Value Date/Time   NA 137 02/04/2014 1358   K 4.1 02/04/2014 1358   CL 104 02/04/2014 1358   CO2 29 02/04/2014 1358   GLUCOSE 100* 02/04/2014 1358   BUN 6 02/04/2014 1358   CREATININE 0.7 02/04/2014 1358   CALCIUM 8.6 02/04/2014 1358   GFRNONAA >90 12/04/2013 1134   GFRAA >90 12/04/2013 1134    Lipid Panel  No results found for: CHOL, TRIG, HDL, CHOLHDL, VLDL, LDLCALC  CBC    Component Value Date/Time   WBC 10.1 02/04/2014 1358   RBC 4.51 02/04/2014 1358   HGB 12.2 02/04/2014 1358   HCT 36.6 02/04/2014 1358   PLT 300.0 02/04/2014 1358   MCV 81.3 02/04/2014 1358   MCH 27.2 12/04/2013 1134   MCHC 33.3 02/04/2014 1358   RDW 14.8 02/04/2014 1358   LYMPHSABS 1.4 12/04/2013 1134   MONOABS 0.5 12/04/2013 1134   EOSABS 0.1 12/04/2013 1134   BASOSABS  0.0 12/04/2013 1134    Hgb A1C No results found for: HGBA1C       Assessment & Plan:   Diarrhea from Ileostomy bag:  Advised her that the stool from an ileostomy is more watery that normal because the stool hasn't had time to form. She will continue Imodium We will check BMET and Magnesium to make sure that her electrolytes are stable Advised her to make sure she is drinking plenty of fluids to avoid dehydration  Hypertensive LES:  This is the source  of her pain Advised her to take the Tramadol when she needs Continue to follow up with Dr. Alycia Rossetti.  RTC as needed or if symptoms persist or worsen

## 2014-08-10 NOTE — Patient Instructions (Signed)

## 2014-08-10 NOTE — Progress Notes (Signed)
Pre visit review using our clinic review tool, if applicable. No additional management support is needed unless otherwise documented below in the visit note. 

## 2014-08-19 ENCOUNTER — Encounter: Payer: Self-pay | Admitting: Internal Medicine

## 2014-08-21 ENCOUNTER — Encounter: Payer: Self-pay | Admitting: Internal Medicine

## 2014-08-24 ENCOUNTER — Encounter: Payer: Self-pay | Admitting: Internal Medicine

## 2014-08-24 ENCOUNTER — Ambulatory Visit
Admit: 2014-08-24 | Disposition: A | Discharge status: Home / Self Care | Payer: Self-pay | Attending: Family Medicine | Admitting: Family Medicine

## 2014-08-24 LAB — COMPREHENSIVE METABOLIC PANEL
ALBUMIN: 3.9 g/dL
ALT: 12 U/L — AB
AST: 18 U/L
Alkaline Phosphatase: 91 U/L
Anion Gap: 5 — ABNORMAL LOW (ref 7–16)
BUN: 13 mg/dL
Bilirubin,Total: 0.3 mg/dL
CALCIUM: 9.1 mg/dL
Chloride: 103 mmol/L
Co2: 31 mmol/L
Creatinine: 0.79 mg/dL
EGFR (Non-African Amer.): >60
Glucose: 92 mg/dL
POTASSIUM: 4.4 mmol/L
Sodium: 139 mmol/L
TOTAL PROTEIN: 7 g/dL

## 2014-08-24 LAB — CBC WITH DIFFERENTIAL/PLATELET
BASOS ABS: 0 10*3/uL (ref 0.0–0.1)
Basophil %: 0.6 %
EOS ABS: 0.1 10*3/uL (ref 0.0–0.7)
Eosinophil %: 2.8 %
HCT: 36.6 % (ref 35.0–47.0)
HGB: 12.4 g/dL (ref 12.0–16.0)
LYMPHS PCT: 52.1 %
Lymphocyte #: 2.6 10*3/uL (ref 1.0–3.6)
MCH: 25.8 pg — ABNORMAL LOW (ref 26.0–34.0)
MCHC: 33.9 g/dL (ref 32.0–36.0)
MCV: 76 fL — ABNORMAL LOW (ref 80–100)
MONO ABS: 0.3 x10 3/mm (ref 0.2–0.9)
MONOS PCT: 6 %
Neutrophil #: 1.9 10*3/uL (ref 1.4–6.5)
Neutrophil %: 38.5 %
PLATELETS: 243 10*3/uL (ref 150–440)
RBC: 4.82 10*6/uL (ref 3.80–5.20)
RDW: 17.5 % — AB (ref 11.5–14.5)
WBC: 5.1 10*3/uL (ref 3.6–11.0)

## 2014-08-26 ENCOUNTER — Encounter: Payer: Self-pay | Admitting: Internal Medicine

## 2014-08-27 ENCOUNTER — Encounter: Payer: Self-pay | Admitting: Internal Medicine

## 2014-08-31 ENCOUNTER — Telehealth: Payer: Self-pay | Admitting: Internal Medicine

## 2014-08-31 ENCOUNTER — Encounter: Payer: Self-pay | Admitting: Internal Medicine

## 2014-08-31 NOTE — Telephone Encounter (Signed)
Spoke with patient who states she is under the care of Dr[facility] Sharlee [facility]wKenneth Cook [facility] for [facility] motility [facility]; patient states she has hypertensive lower esophageal sphincter - pt states pain is similar in intensity to MI.  States she was recently started on Isosorbide dinitrate 5 mg 2-3 times per day for esophageal relaxation.  Patient wants to make certain that this medication is safe to take from a cardiac standpoint.  Patient complains of lungs feeling full and pain with breathing - states has hx of multiple pneumonias from yeast infection through port-a-cath.  I advised patient of the pharmacology of isosorbide dinitrate and advised her of safe dosing instructions per my reference materials.  I advised her to follow-up for pain with breathing with PCP (patient states she needs a referral to pulmonologist with [place], does not like pulmonologist she saw in [place]). Patient verbalized understanding and agreement and thanked me for the call.

## 2014-08-31 NOTE — Telephone Encounter (Signed)
NEw Message  Pt has been given medication (isosorbide binitrate) 5 mg 2-3 times/day [facility]- she understands it to be causing some cardiac issues. Pt wanted to speak w/ Rn about what to do going forward. Please call back and discuss.

## 2014-09-01 ENCOUNTER — Other Ambulatory Visit: Payer: Self-pay | Admitting: Internal Medicine

## 2014-09-01 DIAGNOSIS — J984 Other disorders of lung: Secondary | ICD-10-CM

## 2014-09-02 ENCOUNTER — Other Ambulatory Visit: Payer: Self-pay | Admitting: Internal Medicine

## 2014-09-03 NOTE — Telephone Encounter (Signed)
Ok to phone in xanax 

## 2014-09-03 NOTE — Telephone Encounter (Signed)
Last filled 07/31/2014--please advise 

## 2014-09-03 NOTE — Telephone Encounter (Signed)
Rx called in to pharmacy. 

## 2014-09-04 ENCOUNTER — Other Ambulatory Visit: Payer: Self-pay | Admitting: Specialist

## 2014-09-04 DIAGNOSIS — R911 Solitary pulmonary nodule: Secondary | ICD-10-CM

## 2014-09-07 ENCOUNTER — Encounter: Payer: Self-pay | Admitting: Internal Medicine

## 2014-09-16 ENCOUNTER — Encounter: Payer: Self-pay | Admitting: Internal Medicine

## 2014-09-16 ENCOUNTER — Ambulatory Visit (INDEPENDENT_AMBULATORY_CARE_PROVIDER_SITE_OTHER): Payer: 59 | Admitting: Internal Medicine

## 2014-09-16 VITALS — BP 110/70 | HR 80 | Temp 97.7°F | Ht 66.0 in | Wt 189.0 lb

## 2014-09-16 DIAGNOSIS — Z8701 Personal history of pneumonia (recurrent): Secondary | ICD-10-CM

## 2014-09-16 DIAGNOSIS — J984 Other disorders of lung: Secondary | ICD-10-CM

## 2014-09-16 DIAGNOSIS — R911 Solitary pulmonary nodule: Secondary | ICD-10-CM | POA: Diagnosis not present

## 2014-09-16 DIAGNOSIS — R0602 Shortness of breath: Secondary | ICD-10-CM | POA: Diagnosis not present

## 2014-09-16 DIAGNOSIS — J8410 Pulmonary fibrosis, unspecified: Secondary | ICD-10-CM | POA: Diagnosis not present

## 2014-09-16 NOTE — Patient Instructions (Addendum)
Follow up with Dr. Dema SeverinMungal in 3 months - pulmonary function testing and 6 minute walk test prior to follow up - avoid tobacco exposure.  - we will plan for follow up CT Chest in 4-5 months.

## 2014-09-16 NOTE — Progress Notes (Signed)
Date: 09/20/2014  MRN# [id number] Judy Gibbs 1966-04-19  Referring Physician: NP R. Baity  Judy Gibbs is a 49 y.o. old female seen in consultation for hx of pneumonias and pulmonary scarring.   CC:  Chief Complaint  Patient presents with  . Advice Only    Pt referred by Nicki Reaper. She has has pneumo x6 since Sept 2016. She has had sob, chest tightness, seldom cough, and denies wheezing.    HPI:  Patient is a pleasant 49 yo female, accompanied with her husband, seen in consultation for history of pneumonias with pulmonary scarring\nodules. 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  Patient with recent CXR that showed lower lob scarring and possible patchy density in RUL.  Currently, she states that her breathing is stable, no fever, has dyspnea on exertion which is improving.    PMHX:   Past Medical History  Diagnosis Date  . Bipolar disorder   . DDD (degenerative disc disease), cervical   . Anxiety   . Depression   . Fibromyalgia   . GERD (gastroesophageal reflux disease)   . IBS (irritable bowel syndrome)   . Raynauds phenomenon   . Gastroparesis   . Colonic inertia   . Proctalgia   . Pelvic floor dysfunction   . Hypoglycemia   . Interstitial cystitis   . Obesity   .  Vertigo    Surgical Hx:  Past Surgical History  Procedure Laterality Date  . Cholecystectomy    . Hysterectomy - unknown type    . Total knee arthroplasty  89  & 10    R & L  . Colonoscopy  08/08/2001    normal, Dr. Lina Sar  . Upper gastrointestinal endoscopy  06/15/2006    hiatal hernia, gastritis  . Esophagogastroduodenoscopy  12/20/1993    mild antral gastritis, Dr. Lina Sar  . Edg  06/28/1987    Dr. Matthias Hughs, bile in stomach  . Upper gastrointestinal endoscopy  07/04/2001    Dr. Lina Sar  . Diverting ileostomy  2013  . Abdominal hysterectomy  2001    has one ovary   Family Hx:  Family History  Problem Relation Age of Onset  . Colon polyps Paternal Uncle   . Esophageal cancer Paternal Uncle   . Diabetes Father   . Heart disease Father   . Stroke Father   . Hyperlipidemia Father   . Hyperlipidemia Mother   . Cancer Paternal Aunt     breast  . Early death Neg Hx    Social Hx:   History  Substance Use Topics  . Smoking status: Former Smoker    Quit date: 05/22/1982  . Smokeless tobacco: Never Used  . Alcohol Use: No   Medication:   Current Outpatient Rx  Name  Route  Sig  Dispense  Refill  . ALPRAZolam (XANAX) 1 MG tablet  TAKE 1 TO 2 TABLETS BY MOUTH AT BEDTIME AS NEEDED   45 tablet   0   . calcium-vitamin D (OSCAL) 250-125 MG-UNIT per tablet   Oral   Take 2 tablets by mouth every morning.          . dronabinol (MARINOL) 10 MG capsule   Oral   Take 5 mg by mouth daily as needed (nausea).          . loperamide (IMODIUM) 2 MG capsule   Oral   Take 8 mg by mouth 2 (two) times daily.         . Multiple Vitamins-Minerals (ADEKS) chewable tablet   Oral   Chew 2 tablets by mouth every morning. Multivitamin gummie         . omeprazole (PRILOSEC) 40 MG capsule   Oral   Take 40 mg by mouth 2 (two) times daily.         . promethazine (PHENERGAN) 6.25 MG/5ML syrup   Oral   Take 20 mLs by mouth 4 (four) times daily as needed for  nausea.         . sucralfate (CARAFATE) 1 GM/10ML suspension   Oral   Take 1 g by mouth as needed.          . traMADol (ULTRAM) 50 MG tablet   Oral   Take 1 tablet (50 mg total) by mouth every 8 (eight) hours as needed. Patient taking differently: Take 50 mg by mouth as needed.    90 tablet   0   . traZODone (DESYREL) 100 MG tablet   Oral   Take 1 tablet (100 mg total) by mouth at bedtime. Patient taking differently: Take 100 mg by mouth at bedtime as needed.    30 tablet   0   . isosorbide dinitrate (ISORDIL) 5 MG tablet   Oral   Take 5 mg by mouth as directed.      3       Allergies:  Celecoxib and Reglan  Review of Systems: Gen:  Denies  fever, sweats, chills HEENT: Denies blurred vision, double vision, ear pain, eye pain, hearing loss, nose bleeds, sore throat Cvc:  No dizziness, chest pain or heaviness Resp:   Mild sob with exertion Gi: Denies swallowing difficulty, stomach pain, nausea or vomiting, diarrhea, constipation, bowel incontinence Gu:  Denies bladder incontinence, burning urine Ext:   No Joint pain, stiffness or swelling Skin: No skin rash, easy bruising or bleeding or hives Endoc:  No polyuria, polydipsia , polyphagia or weight change Psych: No depression, insomnia or hallucinations  Other:  All other systems negative  Physical Examination:   VS: BP 110/70 mmHg  Pulse 80  Temp(Src) 97.7 F (36.5 C) (Oral)  Ht 5\' 6"  (1.676 m)  Wt 189 lb (85.73 kg)  BMI 30.52 kg/m2  SpO2 98%  General Appearance: No distress  Neuro:without focal findings, mental status, speech normal, alert and oriented, cranial nerves 2-12 intact, reflexes normal and symmetric, sensation grossly normal  HEENT: PERRLA, EOM intact, no ptosis, no other lesions noticed; Mallampati 2 Pulmonary: normal breath sounds., diaphragmatic excursion normal.No wheezing, No rales;   Sputum Production:  none CardiovascularNormal S1,S2.  No m/r/g.  Abdominal aorta pulsation normal.     Abdomen: Benign, Soft, non-tender, No masses, hepatosplenomegaly, No lymphadenopathy Renal:  No costovertebral tenderness  GU:  No performed at this time. Endoc: No evident thyromegaly, no signs of acromegaly or Cushing features Skin:   warm, no rashes, no ecchymosis  Extremities: normal, no cyanosis, clubbing, no edema, warm with normal capillary refill. Other findings:none   Labs results:   Rad results: (The following images and results were reviewed by Dr. Dema Severin). CT Chest 01/2014 CT CHEST WITH CONTRAST  TECHNIQUE: Multidetector CT imaging of the chest was performed during intravenous contrast administration.  CONTRAST: 80mL OMNIPAQUE IOHEXOL 300 MG/ML SOLN  COMPARISON: Abdominal CT 02/04/2014.  FINDINGS: Chest:  Double-lumen port catheter on the right chest wall, with the catheter in the internal jugular vein terminating in the superior vena cava.  No axillary adenopathy. No supraclavicular adenopathy. Unremarkable appearance of the visualized thyroid gland.  Central airways are patent. Right peritracheal node measures 8 mm at the thoracic inlet. Paraesophageal lymph node measures 7 mm distally. Small hiatal hernia.  The questionable nodularity at the base of the left lung on the comparison CT of the abdomen has progressed, now with confluent airspace disease involving the medial basal segment. There is associated trace pleural effusion, with linear and ground-glass opacities.  In addition, ground-glass nodule of the right lower lobe (on image 28 of series 3) measures 8 mm.  Vascular:  Unremarkable appearance of the thoracic aorta. No aneurysm dissection flap or periaortic fluid. Three-vessel arch. No significant atherosclerotic calcifications.  Abdomen:  Surgical changes of prior cholecystectomy. Otherwise unremarkable appearance of the upper abdomen.  IMPRESSION: Nodule of the left lower lobe identified on prior abdominal CT  has progressed, with ground-glass and nodular opacity with associated small pleural effusion. Findings are favored to represent developing pneumonia. In addition there is a small nodule measuring 8 mm at the base of the right lower lobe, which is also favored to be inflammatory/infectious. Given the patient's apparent history of a known primary malignancy, follow-up CT is recommended once the patient has been treated for this acute process to assure resolution of these findings.  CT Chest 05/2014 1. Shifting airspace disease, findings are suspicious for recurrent or residual pneumonia.  No specific features to suggest fungal etiology. 2. Similar borderline thoracic adenopathy, likely reactive. 3. Mild hepatic steatosis  CXR 08/24/14 Impression There is no alveolar pneumonia. There is subtle persistent increased density in the right upper lobe and left lower lobe but there has been overall improvement.  There is stable patchy density likely reflecting scarring.     Assessment and Plan: Pulmonary nodule Multiple pulmonary nodules. Most likely reactive nodules from prior infections.  Plan - repeat CT Chest in 4-5 months.    Shortness of breath Multifactorial - complex respiratory course with multiple pneumonias and sequale of pulmonary scarring in the LLL, deconditioning, chronic illness  Plan: - pulmonary function testing and 6 minute walk test prior to follow up - avoid tobacco exposure.  - follow up CT Chest in 4-5 months.    Pulmonary scarring Secondary to multiple pneumonias. I believe that her porta-a-cath placed in the 2014 may have been a nidus of infection along with her recurrent upper respiratory tract infections.  This area of scarring is small, but will be a possible focus for recurrent infections in the future.  This was explained to the patient who verbalized understanding.   Plan - CT Chest in 4-5 months - supportive care.    History of recurrent  pneumonia Patient with 5-6 self reported episodes of pneumonia within the last year, there is evidence of this based on chart review and available imaging. Her most recent episode of pneumonia was in Jan, 2016, where she was found to have a mild fungemia (mild C. Tropicalis in  the blood), treated with antifungals, and her port-a-cath was removed.  Port-a-cath placed in 02/2013 due to severe episodes of dehydration and diarrhea secondary GI dysmotility issues.   At this time recurrent pulmonary infections most likely due to underlying infections, however, immunocompromised status comes in to question.  Plan - pulmonary function testing and 6 minute walk test prior to follow up - avoid tobacco exposure.  - follow up CT Chest in 4-5 months.  - will discuss checking IgG level along with HIV testing at follow up visit.       Orders for this visit: Orders Placed This Encounter  Procedures  . Pulmonary function test    Standing Status: Future     Number of Occurrences:      Standing Expiration Date: 09/16/2015    Scheduling Instructions:     Alert in to schedule in June    Order Specific Question:  Where should this test be performed?    Answer:  [facility]    Order Specific Question:  Full PFT: includes the following: basic spirometry, spirometry pre & post bronchodilator, diffusion capacity (DLCO), lung volumes    Answer:  Full PFT    Order Specific Question:  MIP/MEP    Answer:  No    Order Specific Question:  6 minute walk    Answer:  Yes    Order Specific Question:  ABG    Answer:  No    Order Specific Question:  Diffusion capacity (DLCO)    Answer:  No    Order Specific Question:  Lung volumes    Answer:  No    Order Specific Question:  Methacholine challenge    Answer:  No     Thank  you for the consultation and for allowing [facility] to assist in the care of your patient. Our recommendations are noted above.  Please contact us if we can be of  further service.   Stephanie AcreVishal Jasline Buskirk, MD [facility] Office Number: [phone]

## 2014-09-20 NOTE — H&P (Signed)
PATIENT NAME:  Judy Gibbs, Judy Gibbs MR#:  [id number] DATE OF BIRTH:  [birth year]  DATE OF ADMISSION:  05/31/2014  PRIMARY CARE PHYSICIAN: Nonlocal.   REFERRING PHYSICIAN: Eryka A. Inocencio HomesGayle, MD    CHIEF COMPLAINT: Fever.   HISTORY OF PRESENT ILLNESS: Ms. Judy Gibbs is a 49 year old female who follows with Dr. Adriana [facility]ook at [facility] for motility disorder and pelvic floor dysfunction, status post loop ileostomy. She has been experiencing fevers since September 2015. The patient has been having multiple episodes of pneumonia, which has been treated since September 2015. The patient recently completed the course of antibiotics with levofloxacin and clindamycin about 2 days' back; however, the same night, the patient started to have fever of 101. Concerning this, the patient was [facility] yesterday and blood cultures were obtained, which have been growing budding yeast. Chest x-ray shows large pneumonia on the left side of the lung. Since the ileostomy was placed, the patient was having extensive diarrhea. Concerning this, the patient had a port placed. Noted to have increased redness and swelling around the area. The patient states she called Dr. Patsey Bertholdook's office today. As the patient continued to have the fever she came to the Emergency Department. The patient is not found to have any fever in the Emergency Department; has a fever of 98.9. She does not have any elevated white blood cell count. However, the blood cultures drew from [facility] are growing budding yeast, which were drawn from the port.   PAST MEDICAL HISTORY:  1.  Hypertensive lower sphincter.  2.  Motility disorder.  3.  Ileostomy. 4.  Pelvic floor disorder.  5.  Port-A-Cath placed.  6.  Cholecystectomy.  7.  Bilateral knee surgery.  ALLERGIES:  1.  REGLAN.  2.  VANCOMYCIN.   HOME MEDICATIONS:  1.  Xanax 1 mg 3 times a day as needed.  2.  Wellbutrin XL 150 mg every 24 hours.  3.  Trazodone 100 mg once a day.   4.  Suprax 400 mg once a day.  5.  Silver sulfadiazine--apply to the affected area.  6.  Phenergan 5 mL 4 times a day.  7.  Omeprazole 40 mg daily.  8.  Marinol 5 mg 2 times a day.  6.  Levofloxacin 750 mg once a day.   SOCIAL HISTORY: No history of smoking, drinking alcohol, or using illicit drugs. Married, lives with her husband.   FAMILY HISTORY: Hypertension in father.   REVIEW OF SYSTEMS:  CONSTITUTIONAL: Experiencing generalized weakness.  EYES: No change in vision.  ENT: No change in hearing.  RESPIRATORY: Has cough, shortness of breath.  CARDIOVASCULAR: No chest pain or palpitations.  GASTROINTESTINAL: Has nausea, vomiting, and used to have diarrhea; however, has good improvement with Lomotil.  NEUROLOGICAL: No weakness or numbness in any part of the body.  SKIN: No rash or lesions.  MUSCULOSKELETAL: No joint pains and aches.   PHYSICAL EXAMINATION:  GENERAL: This is a thin-built female lying down in the bed, not in distress. Does not look toxic.  VITAL SIGNS: Temperature 98.9, pulse 98, blood pressure 120/57, respiratory rate of 19, oxygen saturation 98% on room air.  HEENT: Head normocephalic, atraumatic. There is no scleral icterus. Conjunctivae are normal. Pupils are equal and reactive. Mucous membranes are moist. No pharyngeal erythema.  NECK: Supple. No lymphadenopathy. No JVD. No carotid bruit. No thyromegaly.  CHEST: Has no focal tenderness. Decreased breath sounds in the lower lobes; otherwise, good air entry bilaterally.  HEART:  S1, S2 regular. No murmurs are heard.  ABDOMEN: Bowel sounds are present. Soft, nontender, nondistended. No hepatosplenomegaly. Ileostomy in place draining brownish stools.  EXTREMITIES: No pedal edema. Pulses 2+.  NEUROLOGIC: The patient is alert, oriented to place, person, and time. Cranial nerves II through XII are intact. Motor 5/5 in upper and lower extremities.   LABORATORY DATA: BMP: Potassium 3.3. The rest of the values are within  normal limits.   CBC: WBC of 10.1, hemoglobin 11, platelet count of 358,000 with MCV of 75.   Chest x-ray showed consistent with bilateral pneumonia improved from CT scan performed from 03/2014.   ASSESSMENT AND PLAN: [name] is a 49 year old female who comes to the Emergency Department with sepsis secondary to fungemia.  1.  Fungemia: The blood cultures drew from the port are growing budding yeast. Follow with the final sensitivities. Continue the Diflucan 200 mg b.i.d. We will obtain an echocardiogram. Highly concerning about infected port. Consult infectious disease. Will need to have the port removed as the patient does not have any urgent need.  2.  Bilateral pneumonia: The patient has been having persistent pneumonia since September. The  patient will benefit having pulmonology evaluation and bronchoscopy with bronchoalveolar lavage. Always concerned about immunosuppressive state, we will obtain HIV test.  3.  Dysmotility disorder, follows with Dr. Adriana Simas: The patient is tolerating the diet well.  4.  Depression: Continue the trazodone and Wellbutrin.  5.  Keep the patient on deep vein thrombosis prophylaxis with Lovenox.   TIME SPENT: 55 minutes.    ____________________________ Susa Griffins, MD pv:ts D: 05/31/2014 22:58:17 ET T: 05/31/2014 23:34:12 ET JOB#: [id number]  cc: Susa Griffins, MD, <Dictator> Susa Griffins MD ELECTRONICALLY SIGNED 06/12/2014 21:30

## 2014-09-20 NOTE — Consult Note (Signed)
Brief Consult Note: Diagnosis: Fungal line infection, recurrent pna.   Patient was seen by consultant.   Consult note dictated.   Recommend to proceed with surgery or procedure.   Recommend further assessment or treatment.   Orders entered.   Comments: Remove port Check ct chest - after review of ct may dc iv abx  Cont fluconazole.  Electronic Signatures: Dierdre HarnessFitzgerald, David Patrick (MD)  (Signed 11-Jan-16 14:16)  Authored: Brief Consult Note   Last Updated: 11-Jan-16 14:16 by Dierdre HarnessFitzgerald, David Patrick (MD)

## 2014-09-20 NOTE — Assessment & Plan Note (Signed)
Multifactorial - complex respiratory course with multiple pneumonias and sequale of pulmonary scarring in the LLL, deconditioning, chronic illness  Plan: - pulmonary function testing and 6 minute walk test prior to follow up - avoid tobacco exposure.  - follow up CT Chest in 4-5 months.

## 2014-09-20 NOTE — Consult Note (Signed)
PATIENT NAME:  Judy Gibbs, Karene K MR#:  [id number] DATE OF BIRTH:  Mar 08, 1966  DATE OF CONSULTATION:  06/01/2014  CONSULTING PHYSICIAN:  Cristal Deerhristopher A. Tajanay Hurley, MD  REASON FOR CONSULTATION:  Fungemia concern for infected Port-A-Cath.   HISTORY OF PRESENT ILLNESS:  Judy Gibbs is a pleasant 49 year old who follows with [facility]. Adriana Sim[facility]ook at [facility] for GI motility disorder and pelvic floor dysfunction and has previously had a loop ileostomy. She had a Port-A-Cath placed previously and history of recurrent pneumonia. The reason for the Port-A-Cath is IV fluids for high ostomy output, which has since been resolved with Imodium and no longer currently used. She has noticed that she has had fevers and redness around the ileostomy as well as a large pneumonia. She has been desiring to have the Port-A-Cath removed. [facility] are grown budding yeast, which were drawn from the port, and she desires Port-A-Cath removal. Otherwise, no chills, night sweats, shortness of breath, cough, chest pain, abdominal pain, nausea, vomiting, diarrhea, constipation, dysuria, or hematuria.   PAST MEDICAL HISTORY: 1.  History of Port-A-Cath placement for high ostomy output with fluid resuscitation.  2.  History of motility disorder status post loop ileostomy.  3.  History of pelvic floor disorder.  4.  History of cholecystectomy.  5.  History of knee surgery.  6.  History of hypertensive lower esophageal sphincter.   ALLERGIES:  REGLAN AND VANCOMYCIN.   HOME MEDICATIONS:  Xanax, Wellbutrin, trazodone, Suprax, Silvadene, Phenergan, omeprazole, Marinol, and levofloxacin.  SOCIAL HISTORY:  Denies tobacco, alcohol, or drugs. She is married and lives with her husband, new to the area.   FAMILY HISTORY:  Hypertension in father.   REVIEW OF SYSTEMS:  A 12-point review of systems was obtained. Positives and negatives as above.   PHYSICAL EXAMINATION: VITAL SIGNS:  Temperature 98.2, pulse 87,  blood pressure 104/60.  GENERAL:  No acute distress. Alert and oriented x 3.  HEAD:  Normocephalic, atraumatic. No obvious face trauma.  EYES:  No scleral icterus. No conjunctivitis.    EARS, NOSE, AND THROAT:  Normal external nose. Normal external ears.  LUNGS:  Clear to auscultation, moving air well.  HEART:  Regular rate and rhythm. No murmurs, rubs, or gallops.  ABDOMEN:  Soft, nontender, nondistended.  EXTREMITIES:  Moves all extremities well.  CHEST:  No redness around the port. The port is nontender.   LABORATORY AND RADIOLOGICAL DATA:  Significant for budding yeast from Port-A-Cath. White cell count is 7.7. Otherwise, labs are unremarkable. Potassium is 3.3.    CT of the chest shows suspicion for recurrent or residual pneumonia. No obvious features to suggest fungal etiology and likely reactive thoracic lymph nodes.   ASSESSMENT AND PLAN:  Judy Gibbs is a pleasant 49 year old with a history of Port-A-Cath, who has positive blood cultures in a port that she is no longer using. I think it is reasonable to remove the port per the patient's request and also based on infectious disease recommendations. We will plan on Port-A-Cath removal tomorrow with Dr. Michela PitcherEly.    ____________________________ Si Raiderhristopher A. Francee Setzer, MD cal:nb D: 06/01/2014 20:42:40 ET T: 06/01/2014 22:00:29 ET JOB#: [id number]  cc: Cristal Deerhristopher A. Ersie Savino, MD, <Dictator> Jarvis NewcomerHRISTOPHER A Jerriyah Louis MD ELECTRONICALLY SIGNED 06/02/2014 20:18

## 2014-09-20 NOTE — Discharge Summary (Signed)
PATIENT NAME:  Judy Gibbs, Judy Gibbs MR#:  [id number] DATE OF BIRTH:  04/02/1966  DATE OF ADMISSION:  05/31/2014 DATE OF DISCHARGE:  06/04/2014  DISCHARGE DIAGNOSES:  1.  Fungemia due to port infection.  2.  Pneumonia.   SECONDARY DIAGNOSES:    1.  Hypertension  2.  Motility disorder.  3.  Ileostomy. 4.  Pelvic floor disorder.  5.  Port-A-Cath placed.  6.  Cholecystectomy.  7.  Bilateral knee surgery.  CONSULTATIONS:   1.  Surgical:  Dr. Marshia Lyandy Ely.  2.  Infectious disease, Dr. Clydie Braunavid Fitzgerald.  3.  Pulmonary, Dr. Ned ClinesHerbon Fleming.   PROCEDURES AND RADIOLOGY: Removal of Port-A-Cath by Dr. Marshia Lyandy Ely on 06/02/2014.   Chest x-ray on 05/31/2014 showed bilateral pneumonia. CT scan of the chest on 06/01/2014 showed recurrent or residual pneumonia. Mild hepatic steatosis.   A 2-D echocardiogram on 06/01/2014 showed LVEF of 60% to 65%.   MAJOR LABORATORY PANEL: Urinalysis on admission was negative.   Blood culture grew Candida tropicalis.   Repeat blood cultures 05/31/2014 x 2 were negative.  Urine culture did not have any organisms.   Wound culture grew light growth of Candida tropicalis from the catheter tip.   HISTORY AND SHORT HOSPITAL COURSE:  The patient is a 49 year old female with the above-mentioned medical problems who was admitted for fever and worrisome for fungemia. Please see Dr. Clarita LeberVasireddy's dictated history and physical for further details.  Infectious disease consultation was obtained with Dr. Clydie Braunavid Fitzgerald who recommended surgical consult to get infected port removal, which was performed on 06/02/2014 by Dr. Michela PitcherEly. Subsequently, the patient was slowly improving. The patient was also found to have pneumonia which was being treated with IV antibiotics and was improving, was feeling much better 06/04/2014, remained afebrile and was discharged home in stable condition.   VITAL SIGNS: On the date of discharge, her vital signs are as follows: Temperature 98.1, heart rate 83 per  minute, respirations 18 per minute, blood pressure 127/68, and she was saturating 97% on room air.   PERTINENT PHYSICAL EXAMINATION ON THE DATE OF DISCHARGE:  CARDIOVASCULAR: S1, S2 normal. No murmurs, rubs, or gallop.  LUNGS: Clear to auscultation bilaterally. No wheezes, rales, rhonchi, or crepitation.  ABDOMEN: Soft, benign.  NEUROLOGIC: Nonfocal examination. All other physical examination remained at baseline.   DISCHARGE MEDICATIONS:    Medication Instructions  trazodone 100 mg oral tablet  1 tab(s) orally once a day (at bedtime)   marinol 5 mg oral capsule  1 cap(s) orally once a day, As Needed - for Nausea   omeprazole 40 mg oral delayed release capsule  1  orally 2 times a day and at bedtime   xanax 1 mg oral tablet  1-2 tab(s) orally once a day (at bedtime), As Needed   promethazine 6.25 mg/5 ml oral syrup  20 milliliter(s) orally 4 times a day, As Needed - for Nausea   nucynta 100 mg oral tablet  1 tab(s) orally every 4 hours, As Needed - for Pain   nitrostat 0.4 mg sublingual tablet  1 tab(s) sublingual every 5 minutes, As Needed - for Chest Pain   chewable-vite multiple vitamins oral tablet, chewable  2 tab(s) orally once a day   imodium 2 mg oral tablet  2 tab(s) orally in the morning and at bedtime   calcium carbonate-vitamin d 250 mg/125 units  2 tab(s) orally once a day (in the morning)   diflucan 200 mg oral tablet  2 tab(s) orally once a day x  14 days     DISCHARGE DIET: Regular.   DISCHARGE ACTIVITY: As tolerated.   DISCHARGE INSTRUCTIONS AND FOLLOW-UP:  The patient was instructed to follow up with [facility] in 1 to 2 weeks.  She will need follow-up with infectious disease, Dr. Clydie Braun in 2 to 4 weeks.  She will need follow-up with her primary care physician in 4 to 6 weeks.   TOTAL TIME SPENT:  45 minutes.      ____________________________ Ellamae Sia. Sherryll Burger, MD vss:DT D: 06/06/2014 00:07:22 ET T: 06/06/2014 08:46:09  ET JOB#: [id number]  cc: Fabiana Dromgoole S. Sherryll Burger, MD, <Dictator> Stann Mainland. Sampson Goon, MD Herbon E. Meredeth Ide, MD Carmie End, MD  Ellamae Sia [facility] MD ELECTRONICALLY SIGNED 06/11/2014 10:11

## 2014-09-20 NOTE — Assessment & Plan Note (Addendum)
Patient with 5-6 self reported episodes of pneumonia within the last year, there is evidence of this based on chart review and available imaging. Her most recent episode of pneumonia was in Jan, 2016, where she was found to have a mild fungemia (mild C. Tropicalis in the blood), treated with antifungals, and her port-a-cath was removed.  Port-a-cath placed in 02/2013 due to severe episodes of dehydration and diarrhea secondary GI dysmotility issues.   At this time recurrent pulmonary infections most likely due to underlying infections, however, immunocompromised status comes in to question.  Plan - pulmonary function testing and 6 minute walk test prior to follow up - avoid tobacco exposure.  - follow up CT Chest in 4-5 months.  - will discuss checking IgG level along with HIV testing at follow up visit.

## 2014-09-20 NOTE — Op Note (Signed)
PATIENT NAME:  Judy Gibbs, Judy Gibbs MR#:  [id number] DATE OF BIRTH:  [birth year]  DATE OF PROCEDURE:  06/02/2014  PREOPERATIVE DIAGNOSIS: Infected Port-A-Cath device.   POSTOPERATIVE DIAGNOSIS: Infected Port-A-Cath device.  OPERATION: Removal of Port-A-Cath.   ANESTHESIA: Monitored anesthetic care.   SURGEON: Quentin Orealph L. Ely, MD   OPERATIVE PROCEDURE: With the patient in the supine position after induction of appropriate intravenous sedation, the patient's right chest was prepped with ChloraPrep and draped with sterile towels; 1% Xylocaine buffered with sodium bicarbonate was injected into the site of the port. The area was incised and carried down through the subcutaneous tissue using Bovie electrocautery. The Port-A-Cath capsule was entered. The device appeared to be a double port device. The device was separated from the pocket without difficulty and the entire catheter removed. The tip of the catheter was clipped and sent for culture. The area was irrigated and closed with 3-0 Vicryl. The skin was closed with 4-0 nylon. A compressive dressing was applied. The patient was returned to the recovery room, having tolerated the procedure well. Sponge, instrument, and needle counts were correct x 2 in the operating room.    ____________________________ Quentin Orealph L. Ely III, MD rle:bm D: 06/02/2014 16:04:29 ET T: 06/02/2014 23:40:13 ET JOB#: [id number]  cc: Quentin Orealph L. Ely III, MD, <Dictator> Quentin OreALPH L ELY MD ELECTRONICALLY SIGNED 06/06/2014 18:40

## 2014-09-20 NOTE — Assessment & Plan Note (Signed)
Secondary to multiple pneumonias. I believe that her porta-a-cath placed in the 2014 may have been a nidus of infection along with her recurrent upper respiratory tract infections.  This area of scarring is small, but will be a possible focus for recurrent infections in the future.  This was explained to the patient who verbalized understanding.   Plan - CT Chest in 4-5 months - supportive care.

## 2014-09-20 NOTE — Assessment & Plan Note (Signed)
Multiple pulmonary nodules. Most likely reactive nodules from prior infections.  Plan - repeat CT Chest in 4-5 months.

## 2014-09-20 NOTE — Consult Note (Signed)
PATIENT NAME:  Judy Gibbs, Judy Gibbs MR#:  [id number] DATE OF BIRTH:  04-21-1966  DATE OF CONSULTATION:  06/01/2014  REFERRING PHYSICIAN:  Dr. Posey Pronto CONSULTING PHYSICIAN:  Cheral Marker. Ola Spurr, MD  REASON FOR CONSULTATION: Candida line infection.  HISTORY OF PRESENT ILLNESS: This is a very pleasant 49 year old female with a history of motility disorder who had placement of a loop ileostomy in 2013. In 2014, she had placement of a Port-A-Cath for recurrent issues with large volume of stool and recurrent dehydration. She was having to give herself up to 2 liters of fluid a day. Since September she has had issues with recurrent pneumonia, where she has had cough and fevers. She had been on prolonged courses of multiple antibiotics including levofloxacin and clindamycin. She has followed with Dr. Raul Del for this. She has had a CAT scan and multiple chest x-rays without obvious etiology. The patient was most recently on levofloxacin and clindamycin; however, she started to develop fevers after she was off of it for 2 to 3 days. The patient went to [facility] where she had blood cultures obtained from the port. A chest x-ray also showed a possible pneumonia in the left. He was called when the blood cultures turned positive for yeast and has been admitted. She was given IV fluconazole as well as started on broad-spectrum IV antibiotics. Clinically, she reports feeling somewhat better.   The patient reports that her diarrhea had markedly improved around October of this year when she started to use Imodium. She has in fact not had to use the Port-A-Cath for some time. She had actually planned on having it removed at [facility] this week. She has been feeling like something is wrong with it. She has had some mild tenderness there, but no other redness.   PAST MEDICAL HISTORY: 1.  Hypertensive lower esophageal sphincter disorder.  2.  Chronic gut motility disorder.  3.  Pelvic floor dysfunction.    PAST SURGICAL HISTORY:  1.  Cholecystectomy. 2.  Bilateral knee surgery. 3.  Ileostomy and Port-A-Cath placement.   SOCIAL HISTORY: The patient is married and lives with her husband. No tobacco, alcohol or drugs.   ALLERGIES: REGLAN AND VANCOMYCIN.   ANTIBIOTICS SINCE ADMISSION: Include fluconazole, vancomycin and Zosyn.  FAMILY HISTORY: Noncontributory.   REVIEW OF SYSTEMS: Eleven systems reviewed and negative, except as per HPI.  PHYSICAL EXAMINATION: VITAL SIGNS: Temperature 97.9, T-max since admission was 100.5 on January 11th, pulse 84, blood pressure 114/59, respirations 18, sat 98% on room air.  GENERAL: She is pleasant, interactive, in no acute distress.  HEENT: Pupils equal, round, reactive to light and accommodation. Extraocular movements are intact. Sclerae anicteric. Oropharynx clear. No thrush or oral lesions.  NECK: Supple.  LYMPH: No anterior cervical, posterior cervical or supraclavicular lymphadenopathy.  CHEST: On her right chest wall, she has a Port-A-Cath which has some mild erythema, but does not have any tenderness.  HEART: Regular with a 2/6 systolic murmur.  LUNGS: Clear to auscultation bilaterally.  ABDOMEN: Ileostomy in the right lower quadrant with liquid stool, nontender, soft.  EXTREMITIES: No clubbing, cyanosis or edema.  SKIN: No rash. No peripheral stigmata of endocarditis.  NEUROLOGIC: She is alert and oriented x3. Grossly nonfocal neuro exam.   DIAGNOSTIC DATA: Cultures drawn January 9th, one from a port and one from periphery is growing yeast in the culture from the port. The peripheral culture is negative. Follow-up blood cultures, January 10th, no growth to date. Urinalysis, January 9th, is negative. White  count January 9th was 8.1, hemoglobin 10. platelets 319,000. Currently white count 7.7. LFTs are normal, except slight elevation of alk phos at 127. Renal function is normal with creatinine of 0.79.  Imaging: Chest x-ray January 10th compared  to January 9th shows findings consistent with bilateral pneumonia, improved from CT scan in November.   CT scan done November 19th shows dense wedge-shaped consolidation in the left upper lobe lingula with central air bronchograms compatible with consolidative pneumonia. Additional nodular opacity in the left apex concerning for small focal pneumonia more superiorly. There are reactive mildly prominent mediastinal hilar and subcarinal lymph nodes.  EKG done November 19th was normal.   IMPRESSION: A 49 year old female with history of chronic motility disorder status post ileostomy 2013, Port-A-Cath placement in 2014 for recurrent high-volume liquid stools requiring IV fluid replacement at home. She has been troubled by recurrent pulmonary symptoms and fevers since September of this year. She was treated initially in September with broad-spectrum antibiotics. She has had a CT scan as well as a chest x-ray. She has been followed by pulmonary without obvious etiology of her pulmonary symptoms. 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 However, this is not typical for aspiration pneumonia. Now she has candida growing from Port-A-Cath culture with negative blood cultures peripherally. She does not have a fever or white count and is not septic.   PLAN:  1.  I do suspect she has candidal port infection. She has not had to use the port in several months and would like it removed, and I would think this would be the first step in treating this.  2.  Continue fluconazole.  3.  Check CT of the chest. She could have had issues with recurrent septic emboli causing recurrent pneumonia.  4.  If her CT of her chest does not show bacterial consolidative process, I would suggest stopping her other antibiotics and continuing the fluconazole.  5.  Duration of therapy for candida port infection is usually 2 weeks following removal of the port. Hopefully, this will be albicans  species and can be easily treated with oral fluconazole at discharge.  6.  If her pulmonary and pneumonia symptoms persist, despite removal of the Port-A-Cath, and she potentially has risk of aspiration, would consider further evaluation by gastroenterology [facility] where she is followed.   Thank you for the consult. I will be glad to follow with you.   ____________________________ Cheral Marker. Ola Spurr, MD dpf:sb D: 06/01/2014 14:12:08 ET T: 06/01/2014 14:33:14 ET JOB#: [id number]  cc: Cheral Marker. Ola Spurr, MD, <Dictator> Ronit Marczak Ola Spurr MD ELECTRONICALLY SIGNED 06/03/2014 15:42

## 2014-10-02 ENCOUNTER — Ambulatory Visit
Admission: EM | Admit: 2014-10-02 | Discharge: 2014-10-02 | Disposition: A | Discharge status: Home / Self Care | Payer: 59 | Attending: Family Medicine | Admitting: Family Medicine

## 2014-10-02 DIAGNOSIS — F319 Bipolar disorder, unspecified: Secondary | ICD-10-CM | POA: Diagnosis not present

## 2014-10-02 DIAGNOSIS — F419 Anxiety disorder, unspecified: Secondary | ICD-10-CM | POA: Diagnosis not present

## 2014-10-02 DIAGNOSIS — Z87891 Personal history of nicotine dependence: Secondary | ICD-10-CM | POA: Insufficient documentation

## 2014-10-02 DIAGNOSIS — E669 Obesity, unspecified: Secondary | ICD-10-CM | POA: Insufficient documentation

## 2014-10-02 DIAGNOSIS — F329 Major depressive disorder, single episode, unspecified: Secondary | ICD-10-CM | POA: Diagnosis not present

## 2014-10-02 DIAGNOSIS — K3184 Gastroparesis: Secondary | ICD-10-CM | POA: Insufficient documentation

## 2014-10-02 DIAGNOSIS — K219 Gastro-esophageal reflux disease without esophagitis: Secondary | ICD-10-CM | POA: Diagnosis not present

## 2014-10-02 DIAGNOSIS — M797 Fibromyalgia: Secondary | ICD-10-CM | POA: Diagnosis not present

## 2014-10-02 DIAGNOSIS — N390 Urinary tract infection, site not specified: Secondary | ICD-10-CM | POA: Diagnosis not present

## 2014-10-02 LAB — URINALYSIS COMPLETE WITH MICROSCOPIC (ARMC ONLY)
Bilirubin Urine: NEGATIVE
GLUCOSE, UA: NEGATIVE mg/dL
Ketones, ur: NEGATIVE mg/dL
Nitrite: POSITIVE — AB
PH: 6 (ref 5.0–8.0)
Protein, ur: >300 mg/dL — AB
SQUAMOUS EPITHELIAL / LPF: NONE SEEN — AB
Specific Gravity, Urine: 1.03 (ref 1.005–1.030)

## 2014-10-02 MED ORDER — NITROFURANTOIN MONOHYD MACRO 100 MG PO CAPS: 100.0000 mg | ORAL_CAPSULE | Freq: Two times a day (BID) | ORAL | Status: DC

## 2014-10-02 MED ORDER — KETOROLAC TROMETHAMINE 60 MG/2ML IM SOLN
60.0000 mg | Freq: Once | INTRAMUSCULAR | Status: AC
  Administered 2014-10-02: 60 mg via INTRAMUSCULAR

## 2014-10-02 NOTE — ED Notes (Signed)
Patient states that pain started first part of the week. She complains of subrapubic pain. She states that she feels like the UTI has since went in to a kidney infection. Patient states that she has urgency with decreased output.

## 2014-10-02 NOTE — Discharge Instructions (Signed)
Please take your bladder antibiotic Macrobid one every morning and repeat every evening. Drink as much water  as you comfortably can, and empty your bladder frequently. In the future, if a day of increasing fluids doesn't help relieve the bladder pain and urinary frequency  then please have a urine teat repeated. The current specimen will be sent to culture and final results will be available in three days. Please call for those results. Your exam is negative for anything more severe than a bladder infection at this time and the antibiotics Should make you feel better . Your normal history is to have episodes of nausea - please use your at home medications per usual. There is no increased back pain localized to the kidneys that I can identify at this time . If you develop increased pain ,a fever you cannot control with your usual therapies or increased pain please seek additional medical care.    Urinary Tract Infection Urinary tract infections (UTIs) can develop anywhere along your urinary tract. Your urinary tract is your body's drainage system for removing wastes and extra water. Your urinary tract includes two kidneys, two ureters, a bladder, and a urethra. Your kidneys are a pair of bean-shaped organs. Each kidney is about the size of your fist. They are located below your ribs, one on each side of your spine. CAUSES Infections are caused by microbes, which are microscopic organisms, including fungi, viruses, and bacteria. These organisms are so small that they can only be seen through a microscope. Bacteria are the microbes that most commonly cause UTIs. SYMPTOMS  Symptoms of UTIs may vary by age and gender of the patient and by the location of the infection. Symptoms in young women typically include a frequent and intense urge to urinate and a painful, burning feeling in the bladder or urethra during urination. Older women and men are more likely to be tired, shaky, and weak and have muscle aches  and abdominal pain. A fever may mean the infection is in your kidneys. Other symptoms of a kidney infection include pain in your back or sides below the ribs, nausea, and vomiting. DIAGNOSIS To diagnose a UTI, your caregiver will ask you about your symptoms. Your caregiver also will ask to provide a urine sample. The urine sample will be tested for bacteria and white blood cells. White blood cells are made by your body to help fight infection. TREATMENT  Typically, UTIs can be treated with medication. Because most UTIs are caused by a bacterial infection, they usually can be treated with the use of antibiotics. The choice of antibiotic and length of treatment depend on your symptoms and the type of bacteria causing your infection. HOME CARE INSTRUCTIONS  If you were prescribed antibiotics, take them exactly as your caregiver instructs you. Finish the medication even if you feel better after you have only taken some of the medication.  Drink enough water and fluids to keep your urine clear or pale yellow.  Avoid caffeine, tea, and carbonated beverages. They tend to irritate your bladder.  Empty your bladder often. Avoid holding urine for long periods of time.  Empty your bladder before and after sexual intercourse.  After a bowel movement, women should cleanse from front to back. Use each tissue only once. SEEK MEDICAL CARE IF:   You have back pain.  You develop a fever.  Your symptoms do not begin to resolve within 3 days. SEEK IMMEDIATE MEDICAL CARE IF:   You have severe back pain or lower  abdominal pain.  You develop chills.  You have nausea or vomiting.  You have continued burning or discomfort with urination. MAKE SURE YOU:   Understand these instructions.  Will watch your condition.  Will get help right away if you are not doing well or get worse. Document Released: 02/15/2005 Document Revised: 11/07/2011 Document Reviewed: 06/16/2011 [facility] Patient Information 2015  [place], [place]. This information is not intended to replace advice given to you by your health care provider. Make sure you discuss any questions you have with your health care provider.

## 2014-10-02 NOTE — ED Provider Notes (Signed)
CSN: [id number]     Arrival date & time 10/02/14  1152 History   First MD Initiated Contact with Patient 10/02/14 1222     Chief Complaint  Patient presents with  . Urinary Tract Infection   (Consider location/radiation/quality/duration/timing/severity/associated sxs/prior Treatment) Patient is a 49 y.o. female presenting with frequency and dysuria.  Urinary Frequency Pertinent negatives include no headaches and no shortness of breath.  Dysuria Pain quality:  Aching and burning Pain severity:  Moderate Onset quality:  Gradual Duration:  4 days Timing:  Intermittent Progression:  Waxing and waning Chronicity:  Recurrent Recent urinary tract infections: yes   Relieved by:  Nothing Urinary symptoms: frequent urination and incontinence   Associated symptoms: nausea   Associated symptoms: no fever, no flank pain and no vomiting   Has had previous episodes a few times a year. Has had increasing symptoms over the past week of dysuria, frequency and malodorous urine. She has a complicated history and commonly has transient nausea. Has a history of muscle aches and pain. Has not had increased back pain, or fever, or vomiting   Past Medical History  Diagnosis Date  . Bipolar disorder   . DDD (degenerative disc disease), cervical   . Anxiety   . Depression   . Fibromyalgia   . GERD (gastroesophageal reflux disease)   . IBS (irritable bowel syndrome)   . Raynauds phenomenon   . Gastroparesis   . Colonic inertia   . Proctalgia   . Pelvic floor dysfunction   . Hypoglycemia   . Interstitial cystitis   . Obesity   . Vertigo    Past Surgical History  Procedure Laterality Date  . Cholecystectomy    . Hysterectomy - unknown type    . Total knee arthroplasty  89  & 10    R & L  . Colonoscopy  08/08/2001    normal, Dr. Lina Sar  . Upper gastrointestinal endoscopy  06/15/2006    hiatal hernia, gastritis  . Esophagogastroduodenoscopy  12/20/1993    mild antral gastritis, Dr. Lina Sar  . Edg  06/28/1987    Dr. Matthias Hughs, bile in stomach  . Upper gastrointestinal endoscopy  07/04/2001    Dr. Lina Sar  . Diverting ileostomy  2013  . Abdominal hysterectomy  2001    has one ovary   Family History  Problem Relation Age of Onset  . Colon polyps Paternal Uncle   . Esophageal cancer Paternal Uncle   . Diabetes Father   . Heart disease Father   . Stroke Father   . Hyperlipidemia Father   . Hyperlipidemia Mother   . Cancer Paternal Aunt     breast  . Early death Neg Hx    History  Substance Use Topics  . Smoking status: Former Smoker    Quit date: 05/22/1982  . Smokeless tobacco: Never Used  . Alcohol Use: No   OB History    No data available     Review of Systems  Constitutional: Negative for fever.  HENT: Negative.   Eyes: Negative.   Respiratory: Negative.  Negative for shortness of breath.   Cardiovascular: Negative.   Gastrointestinal: Positive for nausea. Negative for vomiting.  Endocrine: Negative.   Genitourinary: Positive for dysuria and frequency. Negative for flank pain.  Musculoskeletal: Negative.   Skin: Negative.   Allergic/Immunologic: Negative.   Neurological: Negative for dizziness, weakness, light-headedness, numbness and headaches.  Hematological: Negative.   Psychiatric/Behavioral: Negative.   Routinely has transient nausea and that is no  different than her usual. Muscular discomfort of mid body and legs also within her usual parameters per patient and husband  Allergies  Celecoxib and Reglan  Home Medications   Prior to Admission medications   Medication Sig Start Date End Date Taking? Authorizing Provider  ALPRAZolam Prudy Feeler(XANAX) 1 MG tablet TAKE 1 TO 2 TABLETS BY MOUTH AT BEDTIME AS NEEDED 09/03/14  Yes Lorre Munroeegina W Baity, NP  calcium-vitamin D (OSCAL) 250-125 MG-UNIT per tablet Take 2 tablets by mouth every morning.    Yes Historical Provider, MD  dronabinol (MARINOL) 10 MG capsule Take 5 mg by mouth daily as needed (nausea).     Yes Historical Provider, MD  isosorbide dinitrate (ISORDIL) 5 MG tablet Take 5 mg by mouth as directed. 09/03/14  Yes Historical Provider, MD  loperamide (IMODIUM) 2 MG capsule Take 8 mg by mouth 2 (two) times daily.   Yes Historical Provider, MD  Multiple Vitamins-Minerals (ADEKS) chewable tablet Chew 2 tablets by mouth every morning. Multivitamin gummie   Yes Historical Provider, MD  omeprazole (PRILOSEC) 40 MG capsule Take 40 mg by mouth 2 (two) times daily.   Yes Historical Provider, MD  promethazine (PHENERGAN) 6.25 MG/5ML syrup Take 20 mLs by mouth 4 (four) times daily as needed for nausea.   Yes Historical Provider, MD  sucralfate (CARAFATE) 1 GM/10ML suspension Take 1 g by mouth as needed.    Yes Historical Provider, MD  traMADol (ULTRAM) 50 MG tablet Take 1 tablet (50 mg total) by mouth every 8 (eight) hours as needed. Patient taking differently: Take 50 mg by mouth as needed.  06/08/14  Yes Lorre Munroeegina W Baity, NP  traZODone (DESYREL) 100 MG tablet Take 1 tablet (100 mg total) by mouth at bedtime. Patient taking differently: Take 100 mg by mouth at bedtime as needed.  10/27/13  Yes Lorre Munroeegina W Baity, NP  nitrofurantoin, macrocrystal-monohydrate, (MACROBID) 100 MG capsule Take 1 capsule (100 mg total) by mouth 2 (two) times daily. 10/02/14   Rae HalstedLaurie W Lee, PA-C   BP 103/74 mmHg  Pulse 98  Temp(Src) 98.2 F (36.8 C) (Tympanic)  Ht 5\' 6"  (1.676 m)  Wt 181 lb (82.101 kg)  BMI 29.23 kg/m2  SpO2 98% Physical Exam  Constitutional: She appears distressed.  HENT:  Head: Atraumatic.  Eyes: EOM are normal.  Neck: Normal range of motion. Neck supple.  Cardiovascular: Normal rate.   Pulmonary/Chest: No respiratory distress.  Abdominal: Soft. Bowel sounds are normal. She exhibits no distension. There is no tenderness. There is no CVA tenderness.    Lymphadenopathy:    She has no cervical adenopathy.  Neurological: She is alert.  In lower abdominal distress, cramping discomfort of bladder. Has  multiple other conditions which she contends with daily and reports as unchanged.  ED Course  Procedures (including critical care time) Labs Review Labs Reviewed  URINALYSIS COMPLETEWITH MICROSCOPIC [facility])  - Abnormal; Notable for the following:    APPearance CLOUDY (*)    Hgb urine dipstick 3+ (*)    Protein, ur >300 (*)    Nitrite POSITIVE (*)    Leukocytes, UA 1+ (*)    Bacteria, UA MANY (*)    Squamous Epithelial / LPF NONE SEEN (*)    All other components within normal limits  URINE CULTURE   Results for orders placed or performed during the hospital encounter of 10/02/14  Urinalysis complete, with microscopic  Result Value Ref Range   Color, Urine YELLOW YELLOW   APPearance CLOUDY (A) CLEAR   Glucose, UA  NEGATIVE NEGATIVE mg/dL   Bilirubin Urine NEGATIVE NEGATIVE   Ketones, ur NEGATIVE NEGATIVE mg/dL   Specific Gravity, Urine 1.030 1.005 - 1.030   Hgb urine dipstick 3+ (A) NEGATIVE   pH 6.0 5.0 - 8.0   Protein, ur >300 (A) NEGATIVE mg/dL   Nitrite POSITIVE (A) NEGATIVE   Leukocytes, UA 1+ (A) NEGATIVE   RBC / HPF 6-30 <3 RBC/hpf   WBC, UA TOO NUMEROUS TO COUNT <3 WBC/hpf   Bacteria, UA MANY (A) RARE   Squamous Epithelial / LPF NONE SEEN (A) RARE    Imaging Review No results found.  Medications  ketorolac (TORADOL) injection 60 mg (60 mg Intramuscular Given 10/02/14 1329)    Patient had marked relief of her discomfort with the Toradol Rx MDM   1. UTI (lower urinary tract infection)      Rae HalstedLaurie W Lee, PA-C 10/02/14 1428  Rae HalstedLaurie W Lee, PA-C 10/02/14 1429

## 2014-10-05 ENCOUNTER — Encounter: Payer: Self-pay | Admitting: Internal Medicine

## 2014-10-07 ENCOUNTER — Telehealth: Payer: Self-pay

## 2014-10-07 MED ORDER — CIPROFLOXACIN HCL 500 MG PO TABS: 500.0000 mg | ORAL_TABLET | Freq: Two times a day (BID) | ORAL | Status: DC

## 2014-10-07 NOTE — ED Notes (Signed)
Spoke with pt. We have sent in Rx to [facility] in [place]. Patient has been advised.

## 2014-10-07 NOTE — Telephone Encounter (Signed)
-----   Message from Payton Mccallumrlando Conty, MD sent at 10/07/2014  5:44 PM EDT ----- Jamal Maesried sending rx for cipro; not sure it went through Epic to pharmacy; please verify with pharmacy (if not, then ok with giving verbal order to pharmacist); notify patient of new rx

## 2014-10-07 NOTE — Telephone Encounter (Signed)
-----   Message from Orlando Conty, MD sent at 10/07/2014  5:44 PM EDT ----- Tried sending rx for cipro; not sure it went through Epic to pharmacy; please verify with pharmacy (if not, then ok with giving verbal order to pharmacist); notify patient of new rx 

## 2014-10-16 ENCOUNTER — Ambulatory Visit: Payer: Self-pay | Admitting: Nurse Practitioner

## 2014-11-03 ENCOUNTER — Encounter: Payer: Self-pay | Admitting: Internal Medicine

## 2014-11-03 ENCOUNTER — Ambulatory Visit (INDEPENDENT_AMBULATORY_CARE_PROVIDER_SITE_OTHER): Payer: 59 | Admitting: Internal Medicine

## 2014-11-03 VITALS — BP 120/78 | HR 75 | Temp 98.1°F | Wt 190.0 lb

## 2014-11-03 DIAGNOSIS — K911 Postgastric surgery syndromes: Secondary | ICD-10-CM

## 2014-11-03 DIAGNOSIS — F32A Depression, unspecified: Secondary | ICD-10-CM

## 2014-11-03 DIAGNOSIS — K220 Achalasia of cardia: Secondary | ICD-10-CM

## 2014-11-03 DIAGNOSIS — K224 Dyskinesia of esophagus: Secondary | ICD-10-CM

## 2014-11-03 DIAGNOSIS — R197 Diarrhea, unspecified: Secondary | ICD-10-CM

## 2014-11-03 DIAGNOSIS — Z9289 Personal history of other medical treatment: Secondary | ICD-10-CM | POA: Diagnosis not present

## 2014-11-03 DIAGNOSIS — K599 Functional intestinal disorder, unspecified: Secondary | ICD-10-CM

## 2014-11-03 DIAGNOSIS — F315 Bipolar disorder, current episode depressed, severe, with psychotic features: Secondary | ICD-10-CM

## 2014-11-03 DIAGNOSIS — E162 Hypoglycemia, unspecified: Secondary | ICD-10-CM

## 2014-11-03 DIAGNOSIS — F329 Major depressive disorder, single episode, unspecified: Secondary | ICD-10-CM | POA: Diagnosis not present

## 2014-11-03 DIAGNOSIS — Z9189 Other specified personal risk factors, not elsewhere classified: Secondary | ICD-10-CM

## 2014-11-03 MED ORDER — GLUCOSE BLOOD VI STRP: 1.0000 | ORAL_STRIP | Freq: Four times a day (QID) | Status: DC | PRN

## 2014-11-03 MED ORDER — ALPRAZOLAM 1 MG PO TABS: 1.0000 mg | ORAL_TABLET | Freq: Every evening | ORAL | Status: DC | PRN

## 2014-11-03 MED ORDER — TAPENTADOL HCL 100 MG PO TABS: 1.0000 | ORAL_TABLET | Freq: Every day | ORAL | Status: DC

## 2014-11-03 MED ORDER — ACCU-CHEK FASTCLIX LANCETS MISC: 1.0000 | Freq: Four times a day (QID) | Status: DC | PRN

## 2014-11-03 NOTE — Progress Notes (Signed)
Pre visit review using our clinic review tool, if applicable. No additional management support is needed unless otherwise documented below in the visit note. 

## 2014-11-03 NOTE — Patient Instructions (Signed)

## 2014-11-03 NOTE — Progress Notes (Addendum)
Subjective:    Patient ID: Judy Gibbs, female    DOB: [birth year], 49 y.o.   MRN: [id number]  HPI  Pt presents to the clinic today with multiple complaints:  1- She was diagnosed with POTS and dumping syndrome by her GI, Dr. Alycia Rossetti. She takes 8 Imodium per day. She has liquid stool in her ileostomy, but is also passing stool through her rectum. She is about to undergo a barium swallow and colonoscopy. She has trouble eating and drinking because of her hypertensive LES. This causes her to stay dehydrated. The more dehydrated she is, the more likely her POTS is to flare up. The lack of PO intake also causes her to be hypoglycemic. When she gets hypoglycemic, she starts shaking, feels faint and confused. She is out of her test strips and lancets and would like a refill of that so she can continue to test her sugars when she thinks it is low. Marland Kitchen  2- She also wants to discuss starting back on her Nucynta. She has been off of it for the last 5 months. She was trying to make do with the Tramadol because she concerned about the addictiveness of the Nucynta. With the chronic hypertnesive LES and abdominal cramping from the dumping syndrome, the Tramadol is not effective enough. She would like to restart the Nucynta.  3- She also reports worsening depression. This is a chronic issue for her but worse lately secondary her chronic health conditions, trouble relationship with her husband, and she also has 2 chronically ill children living with her that she tries to take care of. Her depression gets so bad that she sometimes hears negative voices. She has had suicidal tendencies in the past but denies that she is currently SI/HI. She does have a history of anxiety and bipolar disorder as well. She does not currently take any medication for this. She is interested in seeing a therapist and will be leaving here and going straight to the therapist office when she leaves.  Review of Systems      Past Medical  History  Diagnosis Date  . Bipolar disorder   . DDD (degenerative disc disease), cervical   . Anxiety   . Depression   . Fibromyalgia   . GERD (gastroesophageal reflux disease)   . IBS (irritable bowel syndrome)   . Raynauds phenomenon   . Gastroparesis   . Colonic inertia   . Proctalgia   . Pelvic floor dysfunction   . Hypoglycemia   . Interstitial cystitis   . Obesity   . Vertigo     Current Outpatient Prescriptions  Medication Sig Dispense Refill  . ACCU-CHEK FASTCLIX LANCETS MISC 1 each by Does not apply route 4 (four) times daily as needed.    . ALPRAZolam (XANAX) 1 MG tablet TAKE 1 TO 2 TABLETS BY MOUTH AT BEDTIME AS NEEDED 45 tablet 0  . calcium-vitamin D (OSCAL) 250-125 MG-UNIT per tablet Take 2 tablets by mouth every morning.     . dronabinol (MARINOL) 10 MG capsule Take 5 mg by mouth daily as needed (nausea).     Marland Kitchen glucose blood (ACCU-CHEK AVIVA PLUS) test strip 1 each by Other route as needed for other. Use as instructed    . isosorbide dinitrate (ISORDIL) 5 MG tablet Take 5 mg by mouth as directed. 2-3 times daily  3  . loperamide (IMODIUM) 2 MG capsule Take 8 mg by mouth 2 (two) times daily.    . Multiple Vitamins-Minerals (ADEKS) chewable tablet Chew  2 tablets by mouth every morning. Multivitamin gummie    . NON FORMULARY Natural Estrogen cream    . omeprazole (PRILOSEC) 40 MG capsule Take 40 mg by mouth 2 (two) times daily.    . promethazine (PHENERGAN) 6.25 MG/5ML syrup Take 20 mLs by mouth 4 (four) times daily as needed for nausea.    . sucralfate (CARAFATE) 1 GM/10ML suspension Take 1 g by mouth as needed.     . Tapentadol HCl (NUCYNTA) 100 MG TABS Take 1 tablet by mouth.    . traMADol (ULTRAM) 50 MG tablet Take 1 tablet (50 mg total) by mouth every 8 (eight) hours as needed. (Patient taking differently: Take 50 mg by mouth as needed. ) 90 tablet 0  . traZODone (DESYREL) 100 MG tablet Take 1 tablet (100 mg total) by mouth at bedtime. (Patient taking differently:  Take 100 mg by mouth at bedtime as needed. ) 30 tablet 0   No current facility-administered medications for this visit.    Allergies  Allergen Reactions  . Celecoxib     REACTION: hives  . Reglan [Metoclopramide] Anxiety    Family History  Problem Relation Age of Onset  . Colon polyps Paternal Uncle   . Esophageal cancer Paternal Uncle   . Diabetes Father   . Heart disease Father   . Stroke Father   . Hyperlipidemia Father   . Hyperlipidemia Mother   . Cancer Paternal Aunt     breast  . Early death Neg Hx     History   Social History  . Marital Status: Married    Spouse Name: N/A  . Number of Children: 2  . Years of Education: N/A   Occupational History  . homemaker    Social History Main Topics  . Smoking status: Former Smoker    Quit date: 05/22/1982  . Smokeless tobacco: Never Used  . Alcohol Use: No  . Drug Use: No  . Sexual Activity: Yes    Birth Control/ Protection: Surgical   Other Topics Concern  . Not on file   Social History Narrative     Constitutional: Pt reports fatigue. Denies fever, malaise, headache or abrupt weight changes.  Respiratory: Denies difficulty breathing, shortness of breath, cough or sputum production.   Cardiovascular: Denies chest pain, chest tightness, palpitations or swelling in the hands or feet.  Gastrointestinal: Pt reports dehydration, abdominal cramping and diarrhea. Denies constipation, or blood in the stool.  Psych: Pt reports depression and anxiety. Denies SI/HI.  No other specific complaints in a complete review of systems (except as listed in HPI above).  Objective:   Physical Exam   BP 120/78 mmHg  Pulse 75  Temp(Src) 98.1 F (36.7 C) (Oral)  Wt 190 lb (86.183 kg)  SpO2 99% Wt Readings from Last 3 Encounters:  11/03/14 190 lb (86.183 kg)  10/02/14 181 lb (82.101 kg)  09/16/14 189 lb (85.73 kg)    General: Appears her stated age, well developed, well nourished in NAD.  Cardiovascular: Normal rate  and rhythm. S1,S2 noted.   Pulmonary/Chest: Normal effort and positive vesicular breath sounds. No respiratory distress. No wheezes, rales or ronchi noted.  Abdomen: Soft and tender in the epigastric region. Normal bowel sounds, no bruits noted. Ileostomy noted in the RLQ. Liquid stool noted in ostomy bag. Neurological: Alert and oriented.  Psychiatric: She is tearful today. Affect is mildly flat. Behavior and thought content normal.  BMET    Component Value Date/Time   NA 139 08/24/2014 1245  NA 138 08/10/2014 1119   K 4.4 08/24/2014 1245   K 4.1 08/10/2014 1119   CL 103 08/24/2014 1245   CL 104 08/10/2014 1119   CO2 31 08/24/2014 1245   CO2 29 08/10/2014 1119   GLUCOSE 92 08/24/2014 1245   GLUCOSE 94 08/10/2014 1119   BUN 13 08/24/2014 1245   BUN 9 08/10/2014 1119   CREATININE 0.79 08/24/2014 1245   CREATININE 0.95 08/10/2014 1119   CALCIUM 9.1 08/24/2014 1245   CALCIUM 9.5 08/10/2014 1119   GFRNONAA >60 08/24/2014 1245   GFRNONAA >90 12/04/2013 1134   GFRAA >60 08/24/2014 1245   GFRAA >90 12/04/2013 1134    Lipid Panel  No results found for: CHOL, TRIG, HDL, CHOLHDL, VLDL, LDLCALC  CBC    Component Value Date/Time   WBC 5.1 08/24/2014 1245   WBC 10.1 02/04/2014 1358   RBC 4.82 08/24/2014 1245   RBC 4.51 02/04/2014 1358   HGB 12.4 08/24/2014 1245   HGB 12.2 02/04/2014 1358   HCT 36.6 08/24/2014 1245   HCT 36.6 02/04/2014 1358   PLT 243 08/24/2014 1245   PLT 300.0 02/04/2014 1358   MCV 76* 08/24/2014 1245   MCV 81.3 02/04/2014 1358   MCH 25.8* 08/24/2014 1245   MCH 27.2 12/04/2013 1134   MCHC 33.9 08/24/2014 1245   MCHC 33.3 02/04/2014 1358   RDW 17.5* 08/24/2014 1245   RDW 14.8 02/04/2014 1358   LYMPHSABS 2.6 08/24/2014 1245   LYMPHSABS 1.4 12/04/2013 1134   MONOABS 0.3 08/24/2014 1245   MONOABS 0.5 12/04/2013 1134   EOSABS 0.1 08/24/2014 1245   EOSABS 0.1 12/04/2013 1134   BASOSABS 0.0 08/24/2014 1245   BASOSABS 0.0 12/04/2013 1134    Hgb A1C No  results found for: HGBA1C      Assessment & Plan:   Colonic inertia, dumping syndrome, diarrhea:  Continue to follow with Dr. Alycia Rossetti Keep appt for barium swallow and colonoscopy Continue Imodium as directed  Hypertensive LES pain and abdominal cramping:  Stop Tramadol RX for Nucynta today  Hypoglycemia:  Advised her to try to eat something every 2-3 hours Will refill her strips and lancets today  Anxiety, Depression, History of SI and Bipolar with psychotic tendencies:  Discussed with her why I could not treat her with SSRI's as this could increase the likelihood of SI She will go to her therapist office and make an appt to discuss She is contracting for safety She will let me know if she wants a referral to psychiatry  RTC in 3 months to follow up chronic condtions

## 2014-11-03 NOTE — Addendum Note (Signed)
Addended by: Roena Malady on: 11/03/2014 11:57 AM   Modules accepted: Orders

## 2014-11-04 ENCOUNTER — Encounter: Payer: Self-pay | Admitting: Internal Medicine

## 2014-11-04 ENCOUNTER — Other Ambulatory Visit: Payer: Self-pay | Admitting: Internal Medicine

## 2014-11-04 MED ORDER — TAPENTADOL HCL 100 MG PO TABS: 1.0000 | ORAL_TABLET | ORAL | Status: DC

## 2014-11-05 ENCOUNTER — Encounter: Payer: Self-pay | Admitting: Internal Medicine

## 2014-11-05 LAB — URINE CULTURE
Culture: >=100000
SPECIAL REQUESTS: NORMAL

## 2014-11-06 ENCOUNTER — Telehealth: Payer: Self-pay

## 2014-11-06 NOTE — Telephone Encounter (Signed)
Spoke with Ronaldo Miyamoto at [facility] and Nucynta is shedule II drug; pt will need to bring printed rx to pharmacy.

## 2014-11-29 ENCOUNTER — Other Ambulatory Visit: Payer: Self-pay | Admitting: Internal Medicine

## 2014-11-30 ENCOUNTER — Encounter: Payer: Self-pay | Admitting: Internal Medicine

## 2014-11-30 ENCOUNTER — Ambulatory Visit
Admission: EM | Admit: 2014-11-30 | Discharge: 2014-11-30 | Disposition: A | Discharge status: Home / Self Care | Payer: Commercial Managed Care - HMO | Attending: Internal Medicine | Admitting: Internal Medicine

## 2014-11-30 DIAGNOSIS — K3184 Gastroparesis: Secondary | ICD-10-CM | POA: Insufficient documentation

## 2014-11-30 DIAGNOSIS — M797 Fibromyalgia: Secondary | ICD-10-CM | POA: Insufficient documentation

## 2014-11-30 DIAGNOSIS — F419 Anxiety disorder, unspecified: Secondary | ICD-10-CM | POA: Insufficient documentation

## 2014-11-30 DIAGNOSIS — Z87891 Personal history of nicotine dependence: Secondary | ICD-10-CM | POA: Insufficient documentation

## 2014-11-30 DIAGNOSIS — M5030 Other cervical disc degeneration, unspecified cervical region: Secondary | ICD-10-CM | POA: Diagnosis not present

## 2014-11-30 DIAGNOSIS — I7300 Raynaud's syndrome without gangrene: Secondary | ICD-10-CM | POA: Insufficient documentation

## 2014-11-30 DIAGNOSIS — K219 Gastro-esophageal reflux disease without esophagitis: Secondary | ICD-10-CM | POA: Diagnosis not present

## 2014-11-30 DIAGNOSIS — F329 Major depressive disorder, single episode, unspecified: Secondary | ICD-10-CM | POA: Insufficient documentation

## 2014-11-30 DIAGNOSIS — E669 Obesity, unspecified: Secondary | ICD-10-CM | POA: Insufficient documentation

## 2014-11-30 DIAGNOSIS — N390 Urinary tract infection, site not specified: Secondary | ICD-10-CM

## 2014-11-30 DIAGNOSIS — Z79899 Other long term (current) drug therapy: Secondary | ICD-10-CM | POA: Diagnosis not present

## 2014-11-30 DIAGNOSIS — R109 Unspecified abdominal pain: Secondary | ICD-10-CM | POA: Diagnosis present

## 2014-11-30 HISTORY — DX: Orthostatic hypotension: I95.1

## 2014-11-30 HISTORY — DX: Postural orthostatic tachycardia syndrome (POTS): G90.A

## 2014-11-30 HISTORY — DX: Tachycardia, unspecified: R00.0

## 2014-11-30 HISTORY — DX: Other specified cardiac arrhythmias: I49.8

## 2014-11-30 LAB — URINALYSIS COMPLETE WITH MICROSCOPIC (ARMC ONLY)
GLUCOSE, UA: NEGATIVE mg/dL
NITRITE: NEGATIVE
Protein, ur: 100 mg/dL — AB
Specific Gravity, Urine: 1.03 (ref 1.005–1.030)
Squamous Epithelial / LPF: NONE SEEN — AB
pH: 6 (ref 5.0–8.0)

## 2014-11-30 MED ORDER — SULFAMETHOXAZOLE-TRIMETHOPRIM 800-160 MG PO TABS: 1.0000 | ORAL_TABLET | Freq: Two times a day (BID) | ORAL | Status: AC

## 2014-11-30 MED ORDER — KETOROLAC TROMETHAMINE 60 MG/2ML IM SOLN
60.0000 mg | Freq: Once | INTRAMUSCULAR | Status: AC
  Administered 2014-11-30: 60 mg via INTRAMUSCULAR

## 2014-11-30 NOTE — Telephone Encounter (Signed)
She told me she was establishing care with a new PCP in [place] and that all she wanted me to fill was her Nucynta. Has she established care yet??

## 2014-11-30 NOTE — ED Notes (Signed)
Complaints of UTI started Saturday with cold chills, worsen today with abdominal cramping and back pain and passing a lot of mucus in her rectum.

## 2014-11-30 NOTE — Telephone Encounter (Signed)
Last filled 11/03/14--please advise

## 2014-11-30 NOTE — ED Provider Notes (Signed)
CSN: [id number]     Arrival date & time 11/30/14  1420 History   First MD Initiated Contact with Patient 11/30/14 1609     Chief Complaint  Patient presents with  . UTI   . Abdominal Pain  HPI  49 yo lady with history of GI difficulties, has a loop ileostomy. Presents today with increasing abd crampiness for the last 24 hrs or so; had an E coli UTI in 09/2014, tx'ed with cipro, and had similar sx's.   Tactile temps a few days ago.   Decreased appetite.    Past Medical History  Diagnosis Date  . DDD (degenerative disc disease), cervical   . Anxiety   . Depression   . Fibromyalgia   . GERD (gastroesophageal reflux disease)   . Raynauds phenomenon   . Gastroparesis   . Colonic inertia   . Proctalgia   . Pelvic floor dysfunction   . Hypoglycemia   . Interstitial cystitis   . Obesity   . Vertigo   . POTS (postural orthostatic tachycardia syndrome)    Past Surgical History  Procedure Laterality Date  . Cholecystectomy    . Hysterectomy - unknown type    . Total knee arthroplasty  89  & 10    R & L  . Colonoscopy  08/08/2001    normal, Dr. Lina Sarora Brodie  . Upper gastrointestinal endoscopy  06/15/2006    hiatal hernia, gastritis  . Esophagogastroduodenoscopy  12/20/1993    mild antral gastritis, Dr. Lina Sarora Brodie  . Edg  06/28/1987    Dr. Matthias HughsBuccini, bile in stomach  . Upper gastrointestinal endoscopy  07/04/2001    Dr. Lina Sarora Brodie  . Diverting ileostomy  2013  . Abdominal hysterectomy  2001    has one ovary   Family History  Problem Relation Age of Onset  . Colon polyps Paternal Uncle   . Esophageal cancer Paternal Uncle   . Diabetes Father   . Heart disease Father   . Stroke Father   . Hyperlipidemia Father   . Hyperlipidemia Mother   . Cancer Paternal Aunt     breast  . Early death Neg Hx    History  Substance Use Topics  . Smoking status: Former Smoker    Quit date: 05/22/1982  . Smokeless tobacco: Never Used  . Alcohol Use: No    Review of Systems  All other  systems reviewed and are negative.   Allergies  Celecoxib and Reglan  Home Medications   Prior to Admission medications   Medication Sig Start Date End Date Taking? Authorizing Provider  ACCU-CHEK FASTCLIX LANCETS MISC 1 each by Does not apply route 4 (four) times daily as needed. 11/03/14   Lorre Munroeegina W Baity, NP  ALPRAZolam Prudy Feeler(XANAX) 1 MG tablet Take 1 tablet (1 mg total) by mouth at bedtime as needed. 11/03/14   Lorre Munroeegina W Baity, NP  calcium-vitamin D (OSCAL) 250-125 MG-UNIT per tablet Take 2 tablets by mouth every morning.     Historical Provider, MD  dronabinol (MARINOL) 10 MG capsule Take 5 mg by mouth daily as needed (nausea).     Historical Provider, MD  glucose blood (ACCU-CHEK AVIVA PLUS) test strip 1 each by Other route 4 (four) times daily as needed for other. Use as instructed 11/03/14   Lorre Munroeegina W Baity, NP  isosorbide dinitrate (ISORDIL) 5 MG tablet Take 5 mg by mouth as directed. 2-3 times daily 09/03/14   Historical Provider, MD  loperamide (IMODIUM) 2 MG capsule Take 8 mg by mouth  2 (two) times daily.    Historical Provider, MD  Multiple Vitamins-Minerals (ADEKS) chewable tablet Chew 2 tablets by mouth every morning. Multivitamin gummie    Historical Provider, MD  NON FORMULARY Natural Estrogen cream    Historical Provider, MD  omeprazole (PRILOSEC) 40 MG capsule Take 40 mg by mouth 2 (two) times daily.    Historical Provider, MD  promethazine (PHENERGAN) 6.25 MG/5ML syrup Take 20 mLs by mouth 4 (four) times daily as needed for nausea.    Historical Provider, MD  sucralfate (CARAFATE) 1 GM/10ML suspension Take 1 g by mouth as needed.     Historical Provider, MD  sulfamethoxazole-trimethoprim (BACTRIM DS,SEPTRA DS) 800-160 MG per tablet Take 1 tablet by mouth 2 (two) times daily. 11/30/14 12/07/14  Eustace Moore, MD  Tapentadol HCl (NUCYNTA) 100 MG TABS Take 1 tablet (100 mg total) by mouth every 4 (four) hours. 11/04/14   Lorre Munroe, NP  traMADol (ULTRAM) 50 MG tablet Take 1 tablet (50  mg total) by mouth every 8 (eight) hours as needed. Patient taking differently: Take 50 mg by mouth as needed.  06/08/14   Lorre Munroe, NP  traZODone (DESYREL) 100 MG tablet Take 1 tablet (100 mg total) by mouth at bedtime. Patient taking differently: Take 100 mg by mouth at bedtime as needed.  10/27/13   Lorre Munroe, NP   BP 128/62 mmHg  Pulse 98  Temp(Src) 97.8 F (36.6 C) (Oral)  Resp 18  Ht  (1.676 m)  Wt 194 lb (87.998 kg)  BMI 31.33 kg/m2  SpO2 98% Physical Exam  Constitutional: She is oriented to person, place, and time. No distress.  Alert, nicely groomed  HENT:  Head: Atraumatic.  Eyes:  Conjugate gaze, no eye redness/drainage  Neck: Neck supple.  Cardiovascular: Normal rate and regular rhythm.   Pulmonary/Chest: No respiratory distress.  Lungs clear, symmetric breath sounds  Abdominal: Soft. She exhibits no distension. There is no rebound and no guarding.  Ostomy R mid abdomen; diffuse mild tenderness to deep palpation.  Musculoskeletal: Normal range of motion.  No leg swelling  Neurological: She is alert and oriented to person, place, and time.  Skin: Skin is warm and dry.  No cyanosis  Nursing note and vitals reviewed.   ED Course  Procedures  toradol  IM given at urgent care today for abd cramping.  Results for orders placed or performed during the hospital encounter of 11/30/14  Urinalysis complete, with microscopic ([facility] only)  Result Value Ref Range   Color, Urine YELLOW YELLOW   APPearance CLOUDY (A) CLEAR   Glucose, UA NEGATIVE NEGATIVE mg/dL   Bilirubin Urine 1+ (A) NEGATIVE   Ketones, ur TRACE (A) NEGATIVE mg/dL   Specific Gravity, Urine 1.030 1.005 - 1.030   Hgb urine dipstick 2+ (A) NEGATIVE   pH 6.0 5.0 - 8.0   Protein, ur 100 (A) NEGATIVE mg/dL   Nitrite NEGATIVE NEGATIVE   Leukocytes, UA 1+ (A) NEGATIVE   RBC / HPF 6-30 <3 RBC/hpf   WBC, UA TOO NUMEROUS TO COUNT <3 WBC/hpf   Bacteria, UA MANY (A) RARE   Squamous Epithelial /  LPF NONE SEEN (A) RARE   Ca Oxalate Crys, UA PRESENT    Urine cx pending.  MDM   1. UTI (lower urinary tract infection)    rx bactrim sent to pharmacy.  Await urine culture results.      Eustace Moore, MD 11/30/14 [phone]

## 2014-11-30 NOTE — Discharge Instructions (Signed)
Push fluids, rest. Urine culture is pending. Prescription for bactrim (antibiotic) sent to the pharmacy.

## 2014-12-01 NOTE — Telephone Encounter (Signed)
Rx called in to pharmacy. 

## 2014-12-01 NOTE — Telephone Encounter (Signed)
Discussed with pt, ok to phone in Xanax, this month only

## 2014-12-02 ENCOUNTER — Telehealth: Payer: Self-pay

## 2014-12-03 ENCOUNTER — Encounter: Payer: Self-pay | Admitting: Internal Medicine

## 2014-12-04 NOTE — Telephone Encounter (Signed)
Dr. Dayton ScrapeMurray handled

## 2014-12-15 ENCOUNTER — Ambulatory Visit: Payer: Self-pay | Admitting: Internal Medicine

## 2014-12-15 ENCOUNTER — Ambulatory Visit: Payer: Self-pay

## 2014-12-18 ENCOUNTER — Ambulatory Visit: Payer: Self-pay

## 2014-12-19 ENCOUNTER — Encounter: Payer: Self-pay | Admitting: Internal Medicine

## 2014-12-21 MED ORDER — TAPENTADOL HCL 100 MG PO TABS: 1.0000 | ORAL_TABLET | ORAL | Status: DC

## 2014-12-31 ENCOUNTER — Telehealth: Payer: Self-pay | Admitting: Internal Medicine

## 2014-12-31 NOTE — Telephone Encounter (Signed)
Opened in error

## 2015-01-03 DIAGNOSIS — K224 Dyskinesia of esophagus: Secondary | ICD-10-CM | POA: Insufficient documentation

## 2015-01-03 DIAGNOSIS — K220 Achalasia of cardia: Secondary | ICD-10-CM | POA: Insufficient documentation

## 2015-01-05 ENCOUNTER — Ambulatory Visit: Payer: Self-pay | Admitting: Internal Medicine

## 2015-01-25 ENCOUNTER — Encounter: Payer: Self-pay | Admitting: Internal Medicine

## 2015-01-26 ENCOUNTER — Encounter: Payer: Self-pay | Admitting: Internal Medicine

## 2015-01-26 MED ORDER — TAPENTADOL HCL 100 MG PO TABS: 1.0000 | ORAL_TABLET | ORAL | Status: DC

## 2015-01-28 ENCOUNTER — Ambulatory Visit: Payer: 59

## 2015-02-24 ENCOUNTER — Other Ambulatory Visit: Payer: Self-pay | Admitting: Internal Medicine

## 2015-02-24 MED ORDER — TAPENTADOL HCL 100 MG PO TABS: 1.0000 | ORAL_TABLET | Freq: Two times a day (BID) | ORAL | Status: DC

## 2015-03-21 ENCOUNTER — Other Ambulatory Visit: Payer: Self-pay | Admitting: Internal Medicine

## 2015-03-22 ENCOUNTER — Telehealth: Payer: Self-pay | Admitting: Primary Care

## 2015-03-22 MED ORDER — TAPENTADOL HCL 100 MG PO TABS: 1.0000 | ORAL_TABLET | Freq: Two times a day (BID) | ORAL | Status: DC

## 2015-03-22 NOTE — Telephone Encounter (Signed)
Last filled 02/24/2015, too early?  Judy KocherRegina Gibbs's patient

## 2015-03-22 NOTE — Telephone Encounter (Signed)
Please notify Judy Gibbs that her prescription is ready for pick up at her convenience.

## 2015-03-22 NOTE — Telephone Encounter (Signed)
Called patient and notified Rx is ready for pick up, left in from office. Patient verbalized understanding.

## 2015-03-22 NOTE — Telephone Encounter (Signed)
Called and notified patient that Rx is ready for pick up, left in front office. Patient verbalized understanding.

## 2015-04-13 ENCOUNTER — Telehealth: Payer: Self-pay

## 2015-04-13 ENCOUNTER — Other Ambulatory Visit: Payer: Self-pay | Admitting: Primary Care

## 2015-04-13 MED ORDER — TAPENTADOL HCL 100 MG PO TABS: 1.0000 | ORAL_TABLET | Freq: Two times a day (BID) | ORAL | Status: DC

## 2015-04-13 NOTE — Telephone Encounter (Signed)
error 

## 2015-04-13 NOTE — Telephone Encounter (Signed)
RX printed and signed and placed in MYD box 

## 2015-05-05 DIAGNOSIS — K435 Parastomal hernia without obstruction or  gangrene: Secondary | ICD-10-CM | POA: Insufficient documentation

## 2015-07-04 ENCOUNTER — Inpatient Hospital Stay
Admission: EM | Admit: 2015-07-04 | Discharge: 2015-07-06 | DRG: 885 | Disposition: A | Discharge status: Other Acute Inpatient Hospital | Payer: 59 | Attending: Internal Medicine | Admitting: Internal Medicine

## 2015-07-04 ENCOUNTER — Emergency Department: Payer: 59

## 2015-07-04 ENCOUNTER — Encounter: Payer: Self-pay | Admitting: Emergency Medicine

## 2015-07-04 DIAGNOSIS — K589 Irritable bowel syndrome without diarrhea: Secondary | ICD-10-CM | POA: Diagnosis present

## 2015-07-04 DIAGNOSIS — Z90710 Acquired absence of both cervix and uterus: Secondary | ICD-10-CM

## 2015-07-04 DIAGNOSIS — Z800 Family history of malignant neoplasm of digestive organs: Secondary | ICD-10-CM

## 2015-07-04 DIAGNOSIS — Z9049 Acquired absence of other specified parts of digestive tract: Secondary | ICD-10-CM | POA: Diagnosis not present

## 2015-07-04 DIAGNOSIS — K224 Dyskinesia of esophagus: Secondary | ICD-10-CM | POA: Diagnosis present

## 2015-07-04 DIAGNOSIS — Z79899 Other long term (current) drug therapy: Secondary | ICD-10-CM | POA: Diagnosis not present

## 2015-07-04 DIAGNOSIS — E669 Obesity, unspecified: Secondary | ICD-10-CM | POA: Diagnosis present

## 2015-07-04 DIAGNOSIS — G894 Chronic pain syndrome: Secondary | ICD-10-CM | POA: Diagnosis present

## 2015-07-04 DIAGNOSIS — R45851 Suicidal ideations: Secondary | ICD-10-CM | POA: Diagnosis present

## 2015-07-04 DIAGNOSIS — T424X2A Poisoning by benzodiazepines, intentional self-harm, initial encounter: Secondary | ICD-10-CM | POA: Diagnosis present

## 2015-07-04 DIAGNOSIS — K219 Gastro-esophageal reflux disease without esophagitis: Secondary | ICD-10-CM | POA: Diagnosis present

## 2015-07-04 DIAGNOSIS — T50902A Poisoning by unspecified drugs, medicaments and biological substances, intentional self-harm, initial encounter: Secondary | ICD-10-CM

## 2015-07-04 DIAGNOSIS — Z8249 Family history of ischemic heart disease and other diseases of the circulatory system: Secondary | ICD-10-CM

## 2015-07-04 DIAGNOSIS — M797 Fibromyalgia: Secondary | ICD-10-CM | POA: Diagnosis present

## 2015-07-04 DIAGNOSIS — T443X2A Poisoning by other parasympatholytics [anticholinergics and antimuscarinics] and spasmolytics, intentional self-harm, initial encounter: Secondary | ICD-10-CM | POA: Diagnosis present

## 2015-07-04 DIAGNOSIS — Z9889 Other specified postprocedural states: Secondary | ICD-10-CM

## 2015-07-04 DIAGNOSIS — Z87891 Personal history of nicotine dependence: Secondary | ICD-10-CM

## 2015-07-04 DIAGNOSIS — Z6833 Body mass index (BMI) 33.0-33.9, adult: Secondary | ICD-10-CM | POA: Diagnosis not present

## 2015-07-04 DIAGNOSIS — T463X2A Poisoning by coronary vasodilators, intentional self-harm, initial encounter: Secondary | ICD-10-CM | POA: Diagnosis present

## 2015-07-04 DIAGNOSIS — Z888 Allergy status to other drugs, medicaments and biological substances status: Secondary | ICD-10-CM | POA: Diagnosis not present

## 2015-07-04 DIAGNOSIS — K599 Functional intestinal disorder, unspecified: Secondary | ICD-10-CM | POA: Diagnosis present

## 2015-07-04 DIAGNOSIS — Z803 Family history of malignant neoplasm of breast: Secondary | ICD-10-CM | POA: Diagnosis not present

## 2015-07-04 DIAGNOSIS — F332 Major depressive disorder, recurrent severe without psychotic features: Principal | ICD-10-CM

## 2015-07-04 DIAGNOSIS — Z833 Family history of diabetes mellitus: Secondary | ICD-10-CM

## 2015-07-04 DIAGNOSIS — F419 Anxiety disorder, unspecified: Secondary | ICD-10-CM | POA: Diagnosis present

## 2015-07-04 DIAGNOSIS — Z818 Family history of other mental and behavioral disorders: Secondary | ICD-10-CM | POA: Diagnosis not present

## 2015-07-04 DIAGNOSIS — K3189 Other diseases of stomach and duodenum: Secondary | ICD-10-CM | POA: Diagnosis present

## 2015-07-04 DIAGNOSIS — Z823 Family history of stroke: Secondary | ICD-10-CM | POA: Diagnosis not present

## 2015-07-04 DIAGNOSIS — Z96653 Presence of artificial knee joint, bilateral: Secondary | ICD-10-CM | POA: Diagnosis present

## 2015-07-04 DIAGNOSIS — K3184 Gastroparesis: Secondary | ICD-10-CM | POA: Diagnosis present

## 2015-07-04 DIAGNOSIS — R400 Somnolence: Secondary | ICD-10-CM

## 2015-07-04 LAB — CBC WITH DIFFERENTIAL/PLATELET
BASOS ABS: 0 10*3/uL (ref 0–0.1)
BASOS PCT: 1 %
Eosinophils Absolute: 0.1 10*3/uL (ref 0–0.7)
Eosinophils Relative: 1 %
HEMATOCRIT: 36 % (ref 35.0–47.0)
HEMOGLOBIN: 12.5 g/dL (ref 12.0–16.0)
LYMPHS PCT: 12 %
Lymphs Abs: 1.1 10*3/uL (ref 1.0–3.6)
MCH: 27 pg (ref 26.0–34.0)
MCHC: 34.5 g/dL (ref 32.0–36.0)
MCV: 78.2 fL — AB (ref 80.0–100.0)
Monocytes Absolute: 0.5 10*3/uL (ref 0.2–0.9)
Monocytes Relative: 5 %
NEUTROS PCT: 81 %
Neutro Abs: 7.5 10*3/uL — ABNORMAL HIGH (ref 1.4–6.5)
Platelets: 232 10*3/uL (ref 150–440)
RBC: 4.61 MIL/uL (ref 3.80–5.20)
RDW: 15.6 % — ABNORMAL HIGH (ref 11.5–14.5)
WBC: 9.2 10*3/uL (ref 3.6–11.0)

## 2015-07-04 LAB — COMPREHENSIVE METABOLIC PANEL
ALBUMIN: 3.7 g/dL (ref 3.5–5.0)
ALT: 23 U/L (ref 14–54)
AST: 22 U/L (ref 15–41)
Alkaline Phosphatase: 120 U/L (ref 38–126)
Anion gap: 7 (ref 5–15)
BILIRUBIN TOTAL: 0.7 mg/dL (ref 0.3–1.2)
BUN: 18 mg/dL (ref 6–20)
CO2: 25 mmol/L (ref 22–32)
CREATININE: 0.99 mg/dL (ref 0.44–1.00)
Calcium: 8.8 mg/dL — ABNORMAL LOW (ref 8.9–10.3)
Chloride: 109 mmol/L (ref 101–111)
GFR calc Af Amer: >60 mL/min (ref >60)
GLUCOSE: 109 mg/dL — AB (ref 65–99)
Potassium: 4 mmol/L (ref 3.5–5.1)
Sodium: 141 mmol/L (ref 135–145)
TOTAL PROTEIN: 6.4 g/dL — AB (ref 6.5–8.1)

## 2015-07-04 LAB — URINALYSIS COMPLETE WITH MICROSCOPIC (ARMC ONLY)
Bilirubin Urine: NEGATIVE
Glucose, UA: NEGATIVE mg/dL
Hgb urine dipstick: NEGATIVE
KETONES UR: NEGATIVE mg/dL
Leukocytes, UA: NEGATIVE
NITRITE: NEGATIVE
PH: 6 (ref 5.0–8.0)
Protein, ur: 30 mg/dL — AB
Specific Gravity, Urine: 1.029 (ref 1.005–1.030)
Squamous Epithelial / LPF: NONE SEEN
WBC UA: NONE SEEN WBC/hpf (ref 0–5)

## 2015-07-04 LAB — SALICYLATE LEVEL: Salicylate Lvl: <4 mg/dL (ref 2.8–30.0)

## 2015-07-04 LAB — URINE DRUG SCREEN, QUALITATIVE (ARMC ONLY)
Amphetamines, Ur Screen: POSITIVE — AB
BARBITURATES, UR SCREEN: NOT DETECTED
Benzodiazepine, Ur Scrn: POSITIVE — AB
CANNABINOID 50 NG, UR ~~LOC~~: NOT DETECTED
Cocaine Metabolite,Ur ~~LOC~~: NOT DETECTED
MDMA (ECSTASY) UR SCREEN: NOT DETECTED
METHADONE SCREEN, URINE: NOT DETECTED
OPIATE, UR SCREEN: NOT DETECTED
Phencyclidine (PCP) Ur S: NOT DETECTED
TRICYCLIC, UR SCREEN: NOT DETECTED

## 2015-07-04 LAB — HEMOGLOBIN A1C: Hgb A1c MFr Bld: 4.6 % (ref 4.0–6.0)

## 2015-07-04 LAB — TSH: TSH: 2.343 u[IU]/mL (ref 0.350–4.500)

## 2015-07-04 LAB — ACETAMINOPHEN LEVEL: Acetaminophen (Tylenol), Serum: <10 ug/mL — ABNORMAL LOW (ref 10–30)

## 2015-07-04 LAB — ETHANOL

## 2015-07-04 MED ORDER — ENOXAPARIN SODIUM 40 MG/0.4ML ~~LOC~~ SOLN
40.0000 mg | SUBCUTANEOUS | Status: DC
  Administered 2015-07-04 – 2015-07-06 (×3): 40 mg via SUBCUTANEOUS
  Filled 2015-07-04 (×3): qty 0.4

## 2015-07-04 MED ORDER — ACETAMINOPHEN 650 MG RE SUPP: 650.0000 mg | Freq: Four times a day (QID) | RECTAL | Status: DC | PRN

## 2015-07-04 MED ORDER — CALCIUM CARBONATE-VITAMIN D 500-200 MG-UNIT PO TABS
1.0000 | ORAL_TABLET | Freq: Every morning | ORAL | Status: DC
  Administered 2015-07-04 – 2015-07-06 (×3): 1 via ORAL
  Filled 2015-07-04 (×3): qty 1

## 2015-07-04 MED ORDER — SODIUM CHLORIDE 0.9 % IV BOLUS (SEPSIS)
1000.0000 mL | Freq: Once | INTRAVENOUS | Status: AC
  Administered 2015-07-04: 1000 mL via INTRAVENOUS

## 2015-07-04 MED ORDER — ONDANSETRON HCL 4 MG PO TABS
4.0000 mg | ORAL_TABLET | Freq: Four times a day (QID) | ORAL | Status: DC | PRN
  Administered 2015-07-05 – 2015-07-06 (×2): 4 mg via ORAL
  Filled 2015-07-04 (×2): qty 1

## 2015-07-04 MED ORDER — LOPERAMIDE HCL 2 MG PO CAPS: 2.0000 mg | ORAL_CAPSULE | Freq: Four times a day (QID) | ORAL | Status: DC | PRN

## 2015-07-04 MED ORDER — CALCIUM-VITAMIN D 250-125 MG-UNIT PO TABS
2.0000 | ORAL_TABLET | Freq: Every morning | ORAL | Status: DC
  Filled 2015-07-04: qty 2

## 2015-07-04 MED ORDER — ONDANSETRON HCL 4 MG/2ML IJ SOLN: 4.0000 mg | Freq: Four times a day (QID) | INTRAMUSCULAR | Status: DC | PRN

## 2015-07-04 MED ORDER — ACETAMINOPHEN 325 MG PO TABS
650.0000 mg | ORAL_TABLET | Freq: Four times a day (QID) | ORAL | Status: DC | PRN
  Administered 2015-07-04 – 2015-07-06 (×2): 650 mg via ORAL
  Filled 2015-07-04 (×2): qty 2

## 2015-07-04 MED ORDER — SODIUM CHLORIDE 0.9% FLUSH
3.0000 mL | Freq: Two times a day (BID) | INTRAVENOUS | Status: DC
  Administered 2015-07-04 – 2015-07-06 (×4): 3 mL via INTRAVENOUS

## 2015-07-04 MED ORDER — PANTOPRAZOLE SODIUM 40 MG PO TBEC
40.0000 mg | DELAYED_RELEASE_TABLET | Freq: Every day | ORAL | Status: DC
  Administered 2015-07-04 – 2015-07-05 (×2): 40 mg via ORAL
  Filled 2015-07-04 (×2): qty 1

## 2015-07-04 MED ORDER — SODIUM CHLORIDE 0.9 % IV SOLN
INTRAVENOUS | Status: DC
  Administered 2015-07-04 (×2): via INTRAVENOUS

## 2015-07-04 NOTE — H&P (Signed)
Judy Gibbs is an 50 y.o. female.   Chief Complaint: Drug overdose HPI: The patient presents the emergency department admitting that she had tried to hurt herself by overdosing on Xanax, Imdur and dicyclomine. She admits to being severely depressed. This is not the first time she has tried to harm herself. In the emergency department she was found be very somnolent but protecting her airway. She will barely awake and to deny chest pain although she admits she has some vague mid epigastric and abdominal pain. Due to her multiple injections and suicidal ideation the emergency department staff called for admission to observe for vaporization prior to inpatient psychiatric therapy.  Past Medical History  Diagnosis Date  . DDD (degenerative disc disease), cervical   . Anxiety   . Depression   . Fibromyalgia   . GERD (gastroesophageal reflux disease)   . Raynauds phenomenon   . Gastroparesis   . Colonic inertia   . Proctalgia   . Pelvic floor dysfunction   . Hypoglycemia   . Interstitial cystitis   . Obesity   . Vertigo   . POTS (postural orthostatic tachycardia syndrome)     Past Surgical History  Procedure Laterality Date  . Cholecystectomy    . Hysterectomy - unknown type    . Total knee arthroplasty  89  & 10    R & L  . Colonoscopy  08/08/2001    normal, Dr. Delfin Edis  . Upper gastrointestinal endoscopy  06/15/2006    hiatal hernia, gastritis  . Esophagogastroduodenoscopy  12/20/1993    mild antral gastritis, Dr. Delfin Edis  . Edg  06/28/1987    Dr. Cristina Gong, bile in stomach  . Upper gastrointestinal endoscopy  07/04/2001    Dr. Delfin Edis  . Diverting ileostomy  2013  . Abdominal hysterectomy  2001    has one ovary  . Ileostomy revision      Family History  Problem Relation Age of Onset  . Colon polyps Paternal Uncle   . Esophageal cancer Paternal Uncle   . Diabetes Father   . Heart disease Father   . Stroke Father   . Hyperlipidemia Father   . Hyperlipidemia  Mother   . Cancer Paternal Aunt     breast  . Early death Neg Hx    Social History:  reports that she quit smoking about 33 years ago. She has never used smokeless tobacco. She reports that she does not drink alcohol or use illicit drugs.  Allergies:  Allergies  Allergen Reactions  . Celecoxib Hives  . Reglan [Metoclopramide] Anxiety    Prior to Admission medications   Medication Sig Start Date End Date Taking? Authorizing Provider  ALPRAZolam Duanne Moron) 1 MG tablet Take 1 mg by mouth 2 (two) times daily as needed for anxiety or sleep.   Yes Historical Provider, MD  calcium-vitamin D (OSCAL) 250-125 MG-UNIT per tablet Take 2 tablets by mouth every morning.    Yes Historical Provider, MD  dicyclomine (BENTYL) 20 MG tablet Take 20 mg by mouth 3 (three) times daily as needed for spasms.   Yes Historical Provider, MD  isosorbide dinitrate (ISORDIL) 10 MG tablet Take 10 mg by mouth 3 (three) times daily.   Yes Historical Provider, MD  loperamide (IMODIUM) 2 MG capsule Take 2 mg by mouth 4 (four) times daily as needed for diarrhea or loose stools.    Yes Historical Provider, MD  Multiple Vitamins-Minerals (ADEKS) chewable tablet Chew 2 tablets by mouth every morning. Multivitamin gummie  Yes Historical Provider, MD  omeprazole (PRILOSEC) 40 MG capsule Take 40 mg by mouth 2 (two) times daily.   Yes Historical Provider, MD  traZODone (DESYREL) 100 MG tablet Take 1 tablet (100 mg total) by mouth at bedtime. 10/27/13  Yes Jearld Fenton, NP  ACCU-CHEK FASTCLIX LANCETS MISC 1 each by Does not apply route 4 (four) times daily as needed. Patient not taking: Reported on 07/04/2015 11/03/14   Jearld Fenton, NP  ALPRAZolam Duanne Moron) 1 MG tablet TAKE 1 TABLET BY MOUTH AT BEDTIME AS NEEDED Patient not taking: Reported on 07/04/2015 12/01/14   Jearld Fenton, NP  glucose blood (ACCU-CHEK AVIVA PLUS) test strip 1 each by Other route 4 (four) times daily as needed for other. Use as instructed Patient not taking:  Reported on 07/04/2015 11/03/14   Jearld Fenton, NP  Tapentadol HCl (NUCYNTA) 100 MG TABS Take 1 tablet (100 mg total) by mouth every 12 (twelve) hours. Patient not taking: Reported on 07/04/2015 04/13/15   Jearld Fenton, NP     Results for orders placed or performed during the hospital encounter of 07/04/15 (from the past 48 hour(s))  CBC with Differential     Status: Abnormal   Collection Time: 07/04/15  2:06 AM  Result Value Ref Range   WBC 9.2 3.6 - 11.0 K/uL   RBC 4.61 3.80 - 5.20 MIL/uL   Hemoglobin 12.5 12.0 - 16.0 g/dL   HCT 36.0 35.0 - 47.0 %   MCV 78.2 (L) 80.0 - 100.0 fL   MCH 27.0 26.0 - 34.0 pg   MCHC 34.5 32.0 - 36.0 g/dL   RDW 15.6 (H) 11.5 - 14.5 %   Platelets 232 150 - 440 K/uL   Neutrophils Relative % 81 %   Neutro Abs 7.5 (H) 1.4 - 6.5 K/uL   Lymphocytes Relative 12 %   Lymphs Abs 1.1 1.0 - 3.6 K/uL   Monocytes Relative 5 %   Monocytes Absolute 0.5 0.2 - 0.9 K/uL   Eosinophils Relative 1 %   Eosinophils Absolute 0.1 0 - 0.7 K/uL   Basophils Relative 1 %   Basophils Absolute 0.0 0 - 0.1 K/uL  Comprehensive metabolic panel     Status: Abnormal   Collection Time: 07/04/15  2:06 AM  Result Value Ref Range   Sodium 141 135 - 145 mmol/L   Potassium 4.0 3.5 - 5.1 mmol/L   Chloride 109 101 - 111 mmol/L   CO2 25 22 - 32 mmol/L   Glucose, Bld 109 (H) 65 - 99 mg/dL   BUN 18 6 - 20 mg/dL   Creatinine, Ser 0.99 0.44 - 1.00 mg/dL   Calcium 8.8 (L) 8.9 - 10.3 mg/dL   Total Protein 6.4 (L) 6.5 - 8.1 g/dL   Albumin 3.7 3.5 - 5.0 g/dL   AST 22 15 - 41 U/L   ALT 23 14 - 54 U/L   Alkaline Phosphatase 120 38 - 126 U/L   Total Bilirubin 0.7 0.3 - 1.2 mg/dL   GFR calc non Af Amer >60 >60 mL/min   GFR calc Af Amer >60 >60 mL/min    Comment: (NOTE) The eGFR has been calculated using the CKD EPI equation. This calculation has not been validated in all clinical situations. eGFR's persistently <60 mL/min signify possible Chronic Kidney Disease.    Anion gap 7 5 - 15   Ethanol     Status: None   Collection Time: 07/04/15  2:06 AM  Result Value Ref Range  Alcohol, Ethyl (B) <5 <5 mg/dL    Comment:        LOWEST DETECTABLE LIMIT FOR SERUM ALCOHOL IS 5 mg/dL FOR MEDICAL PURPOSES ONLY   Salicylate level     Status: None   Collection Time: 07/04/15  2:06 AM  Result Value Ref Range   Salicylate Lvl <1.0 2.8 - 30.0 mg/dL  Acetaminophen level     Status: Abnormal   Collection Time: 07/04/15  2:06 AM  Result Value Ref Range   Acetaminophen (Tylenol), Serum <10 (L) 10 - 30 ug/mL    Comment:        THERAPEUTIC CONCENTRATIONS VARY SIGNIFICANTLY. A RANGE OF 10-30 ug/mL MAY BE AN EFFECTIVE CONCENTRATION FOR MANY PATIENTS. HOWEVER, SOME ARE BEST TREATED AT CONCENTRATIONS OUTSIDE THIS RANGE. ACETAMINOPHEN CONCENTRATIONS >150 ug/mL AT 4 HOURS AFTER INGESTION AND >50 ug/mL AT 12 HOURS AFTER INGESTION ARE OFTEN ASSOCIATED WITH TOXIC REACTIONS.   Urine Drug Screen, Qualitative ([facility] only)     Status: Abnormal   Collection Time: 07/04/15  3:24 AM  Result Value Ref Range   Tricyclic, Ur Screen NONE DETECTED NONE DETECTED   Amphetamines, Ur Screen POSITIVE (A) NONE DETECTED   MDMA (Ecstasy)Ur Screen NONE DETECTED NONE DETECTED   Cocaine Metabolite,Ur [place] NONE DETECTED NONE DETECTED   Opiate, Ur Screen NONE DETECTED NONE DETECTED   Phencyclidine (PCP) Ur S NONE DETECTED NONE DETECTED   Cannabinoid 50 Ng, Ur [place] NONE DETECTED NONE DETECTED   Barbiturates, Ur Screen NONE DETECTED NONE DETECTED   Benzodiazepine, Ur Scrn POSITIVE (A) NONE DETECTED   Methadone Scn, Ur NONE DETECTED NONE DETECTED    Comment: (NOTE) 272  Tricyclics, urine               Cutoff 1000 ng/mL 200  Amphetamines, urine             Cutoff 1000 ng/mL 300  MDMA (Ecstasy), urine           Cutoff 500 ng/mL 400  Cocaine Metabolite, urine       Cutoff 300 ng/mL 500  Opiate, urine                   Cutoff 300 ng/mL 600  Phencyclidine (PCP), urine      Cutoff 25 ng/mL 700  Cannabinoid, urine               Cutoff 50 ng/mL 800  Barbiturates, urine             Cutoff 200 ng/mL 900  Benzodiazepine, urine           Cutoff 200 ng/mL 1000 Methadone, urine                Cutoff 300 ng/mL 1100 1200 The urine drug screen provides only a preliminary, unconfirmed 1300 analytical test result and should not be used for non-medical 1400 purposes. Clinical consideration and professional judgment should 1500 be applied to any positive drug screen result due to possible 1600 interfering substances. A more specific alternate chemical method 1700 must be used in order to obtain a confirmed analytical result.  1800 Gas chromato graphy / mass spectrometry (GC/MS) is the preferred 1900 confirmatory method.    Dg Chest Portable 1 View  07/04/2015  CLINICAL DATA:  Status post overdose. Cough and wheezing, acute onset. Initial encounter. EXAM: PORTABLE CHEST 1 VIEW COMPARISON:  Chest radiograph performed 08/24/2014 FINDINGS: The lungs are hypoexpanded. Minimal left basilar atelectasis is noted. There is no evidence of pleural effusion  or pneumothorax. The cardiomediastinal silhouette is within normal limits. No acute osseous abnormalities are seen. IMPRESSION: Lungs hypoexpanded, with minimal left basilar atelectasis. Electronically Signed   By: Garald Balding M.D.   On: 07/04/2015 04:03    Review of Systems  Unable to perform ROS: mental status change    Blood pressure 109/97, pulse 92, temperature 97.8 F (36.6 C), temperature source Oral, resp. rate 16, height _0  (1.676 m), weight 94.802 kg (209 lb), SpO2 96 %. Physical Exam  Vitals reviewed. Constitutional: She is oriented to person, place, and time. She appears well-developed and well-nourished. No distress.  HENT:  Head: Normocephalic and atraumatic.  Mouth/Throat: Oropharynx is clear and moist.  Eyes: Conjunctivae and EOM are normal. Pupils are equal, round, and reactive to light. No scleral icterus.  Neck: Normal range of motion. Neck  supple. No JVD present. No tracheal deviation present. No thyromegaly present.  Cardiovascular: Normal rate, regular rhythm and normal heart sounds.  Exam reveals no gallop and no friction rub.   No murmur heard. Respiratory: Effort normal and breath sounds normal.  GI: Soft. Bowel sounds are normal. She exhibits no distension. There is no tenderness.  Genitourinary:  Deferred  Musculoskeletal: Normal range of motion. She exhibits no edema.  Lymphadenopathy:    She has no cervical adenopathy.  Neurological: She is alert and oriented to person, place, and time. No cranial nerve deficit. She exhibits normal muscle tone.  Skin: Skin is warm and dry. No rash noted. No erythema.  Psychiatric:  Clearly depressed although the patient is very somnolent and cannot participate in mental status exam     Assessment/Plan This is a 50 year old Caucasian female with severe depression admitted for benzodiazepine overdose 1. Drug overdose: Benzodiazepines, nitrates and antispasmodics. The patient's blood pressure was mildly soft initially but she has responded well to fluid bolus. We will continue to hydrate with intravenous fluid. Continue to monitor telemetry 2. Major depressive disorder: Continue Nucynta and trazodone 3. Gastroparesis: Continue dicyclomine with the patient is more alert. Check hemoglobin A1c 4. Obesity: BMI is 33.8; encouraged healthy diet and exercise 5. DVT prophylaxis: Lovenox 6. GI prophylaxis: None The patient is a full code. Time spent on admission orders and patient care approximately 45 minutes  Harrie Foreman, MD 07/04/2015, 4:47 AM

## 2015-07-04 NOTE — Progress Notes (Signed)
Pt c/o feeling like she needs to void but cannot.  Bladder scan done with of urine showed in bladder.  Dr. Amado Coe contacted and new order received for I&O cath.  Orson Ape, RN

## 2015-07-04 NOTE — ED Notes (Signed)
Pt O2 saturation 89-90%. 2L O2 nasal cannula applied.

## 2015-07-04 NOTE — ED Provider Notes (Signed)
[facility] Emergency Department Provider Note  ____________________________________________  Time seen: Approximately 2:21 AM  I have reviewed the triage vital signs and the nursing notes.   HISTORY  Chief Complaint Drug Overdose    HPI Judy Gibbs is a 50 y.o. female history of anxiety, depression, fibromyalgia, ileostomy who presents for evaluation after intentional overdose/suicide attempt this evening, gradual onset, constant since onset, severe, no modifying factors. Patient reports that she was trying to kill herself tonight. She reports that she took 20-30 x 10 mg isosorbide dinitrate tablets,30 x 1 mg alprazolam tabs, and 6 x 20 mg dicyclomine tablets. It appears her family found her with decreased responsiveness and called EMS. On their arrival she was noted to be hypotensive. She reports that she is overwhelmed with her own medical issues as well as her special needs children. She has been profoundly depressed. She denies any homicidal ideation or audiovisual hallucinations.   Past Medical History  Diagnosis Date  . DDD (degenerative disc disease), cervical   . Anxiety   . Depression   . Fibromyalgia   . GERD (gastroesophageal reflux disease)   . Raynauds phenomenon   . Gastroparesis   . Colonic inertia   . Proctalgia   . Pelvic floor dysfunction   . Hypoglycemia   . Interstitial cystitis   . Obesity   . Vertigo   . POTS (postural orthostatic tachycardia syndrome)     Patient Active Problem List   Diagnosis Date Noted  . 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 History of recurrent pneumonia 09/20/2014  . Shortness of breath 09/16/2014  . Pulmonary nodule 09/16/2014  . Anxiety and depression 08/07/2013  . Dysautonomia 09/01/2011  . Interstitial cystitis 03/24/2011  . GERD 10/28/2009  . Gastroparesis 10/28/2009  . IRRITABLE BOWEL SYNDROME 10/28/2009  . FIBROMYALGIA 10/28/2009  . Colonic inertia 10/28/2009    Past Surgical  History  Procedure Laterality Date  . Cholecystectomy    . Hysterectomy - unknown type    . Total knee arthroplasty  89  & 10    R & L  . Colonoscopy  08/08/2001    normal, Dr. Lina Sar  . Upper gastrointestinal endoscopy  06/15/2006    hiatal hernia, gastritis  . Esophagogastroduodenoscopy  12/20/1993    mild antral gastritis, Dr. Lina Sar  . Edg  06/28/1987    Dr. Matthias Hughs, bile in stomach  . Upper gastrointestinal endoscopy  07/04/2001    Dr. Lina Sar  . Diverting ileostomy  2013  . Abdominal hysterectomy  2001    has one ovary  . Ileostomy revision      Current Outpatient Rx  Name  Route  Sig  Dispense  Refill  . ALPRAZolam (XANAX) 1 MG tablet   Oral   Take 1 mg by mouth 2 (two) times daily as needed for anxiety or sleep.         . calcium-vitamin D (OSCAL) 250-125 MG-UNIT per tablet   Oral   Take 2 tablets by mouth every morning.          . dicyclomine (BENTYL) 20 MG tablet   Oral   Take 20 mg by mouth 3 (three) times daily as needed for spasms.         . isosorbide dinitrate (ISORDIL) 10 MG tablet   Oral   Take 10 mg by mouth 3 (three) times daily.         Marland Kitchen loperamide (IMODIUM) 2 MG capsule   Oral   Take 2  mg by mouth 4 (four) times daily as needed for diarrhea or loose stools.          . Multiple Vitamins-Minerals (ADEKS) chewable tablet   Oral   Chew 2 tablets by mouth every morning. Multivitamin gummie         . omeprazole (PRILOSEC) 40 MG capsule   Oral   Take 40 mg by mouth 2 (two) times daily.         . traZODone (DESYREL) 100 MG tablet   Oral   Take 1 tablet (100 mg total) by mouth at bedtime.   30 tablet   0   . ACCU-CHEK FASTCLIX LANCETS MISC   Does not apply   1 each by Does not apply route 4 (four) times daily as needed. Patient not taking: Reported on 07/04/2015   360 each   5     Dx code R73.9   . ALPRAZolam (XANAX) 1 MG tablet      TAKE 1 TABLET BY MOUTH AT BEDTIME AS NEEDED Patient not taking: Reported on  07/04/2015   30 tablet   0   . glucose blood (ACCU-CHEK AVIVA PLUS) test strip   Other   1 each by Other route 4 (four) times daily as needed for other. Use as instructed Patient not taking: Reported on 07/04/2015   375 each   5     Dx code R73.9   . Tapentadol HCl (NUCYNTA) 100 MG TABS   Oral   Take 1 tablet (100 mg total) by mouth every 12 (twelve) hours. Patient not taking: Reported on 07/04/2015   60 tablet   0     Allergies Celecoxib and Reglan  Family History  Problem Relation Age of Onset  . Colon polyps Paternal Uncle   . Esophageal cancer Paternal Uncle   . Diabetes Father   . Heart disease Father   . Stroke Father   . Hyperlipidemia Father   . Hyperlipidemia Mother   . Cancer Paternal Aunt     breast  . Early death Neg Hx     Social History Social History  Substance Use Topics  . Smoking status: Former Smoker    Quit date: 05/22/1982  . Smokeless tobacco: Never Used  . Alcohol Use: No    Review of Systems Constitutional: No fever/chills Eyes: No visual changes. ENT: No sore throat. Cardiovascular: Denies chest pain. Respiratory: Denies shortness of breath. Gastrointestinal: No abdominal pain.  No nausea, no vomiting.  No diarrhea.  No constipation. Genitourinary: Negative for dysuria. Musculoskeletal: Negative for back pain. Skin: Negative for rash. Neurological: Negative for headaches, focal weakness or numbness.  10-point ROS otherwise negative.  ____________________________________________   PHYSICAL EXAM:  VITAL SIGNS: ED Triage Vitals  Enc Vitals Group     BP 07/04/15 0152 106/67 mmHg     Pulse Rate 07/04/15 0152 97     Resp 07/04/15 0152 11     Temp 07/04/15 0152 97.8 F (36.6 C)     Temp Source 07/04/15 0152 Oral     SpO2 07/04/15 0152 97 %     Weight 07/04/15 0152 209 lb (94.802 kg)     Height 07/04/15 0152 5\' 6"  (1.676 m)     Head Cir --      Peak Flow --      Pain Score 07/04/15 0155 5     Pain Loc --      Pain Edu?  --      Excl. in GC? --  Constitutional: Appears sleepy but awakens to voice and answers all questions appropriately and follows commands.  in no acute distress. Eyes: Conjunctivae are normal. PERRL. EOMI. Head: Atraumatic. Nose: No congestion/rhinnorhea. Mouth/Throat: Mucous membranes are moist.  Oropharynx non-erythematous. Neck: No stridor.  No cervical spine tenderness to palpation. Cardiovascular: Normal rate, regular rhythm. Grossly normal heart sounds.  Good peripheral circulation. Respiratory: Normal respiratory effort.  No retractions. Lungs CTAB. Gastrointestinal: Soft and nontender. No distention.  No CVA tenderness. Ileostomy in the right abdomen. Stool is dark, guaiac positive. Genitourinary: deferred Musculoskeletal: No lower extremity tenderness nor edema.  No joint effusions. Neurologic:  Normal speech and language. No gross focal neurologic deficits are appreciated.  Skin:  Skin is warm, dry and intact. No rash noted. Psychiatric: Mood is depressed and affect is flat.  ____________________________________________   LABS (all labs ordered are listed, but only abnormal results are displayed)  Labs Reviewed  CBC WITH DIFFERENTIAL/PLATELET - Abnormal; Notable for the following:    MCV 78.2 (*)    RDW 15.6 (*)    Neutro Abs 7.5 (*)    All other components within normal limits  COMPREHENSIVE METABOLIC PANEL - Abnormal; Notable for the following:    Glucose, Bld 109 (*)    Calcium 8.8 (*)    Total Protein 6.4 (*)    All other components within normal limits  ACETAMINOPHEN LEVEL - Abnormal; Notable for the following:    Acetaminophen (Tylenol), Serum <10 (*)    All other components within normal limits  URINE DRUG SCREEN, QUALITATIVE ([facility] ONLY) - Abnormal; Notable for the following:    Amphetamines, Ur Screen POSITIVE (*)    Benzodiazepine, Ur Scrn POSITIVE (*)    All other components within normal limits  ETHANOL  SALICYLATE LEVEL    ____________________________________________  EKG  ED ECG REPORT I, Gayla Doss, the attending physician, personally viewed and interpreted this ECG.   Date: 07/04/2015  EKG Time: 01:58  Rate: 93  Rhythm: normal sinus rhythm  Axis: normal  Intervals:none  ST&T Change: No acute ST elevation.  ____________________________________________  RADIOLOGY  CXR  IMPRESSION: Lungs hypoexpanded, with minimal left basilar atelectasis.  ____________________________________________   PROCEDURES  Procedure(s) performed: None  Critical Care performed: No  ____________________________________________   INITIAL IMPRESSION / ASSESSMENT AND PLAN / ED COURSE  Pertinent labs & imaging results that were available during my care of the patient were reviewed by me and considered in my medical decision making (see chart for details).  Judy Gibbs is a 50 y.o. female history of anxiety, depression, fibromyalgia, ileostomy who presents for evaluation after intentional overdose/suicide attempt this evening. On arrival to the emergency department she appears sleepy but quickly awakens to voice, answers all questions appropriately, follows commands to move all extremity is what she appears to do equally. Vital signs are stable, she is afebrile. She denies any pain complaints at this time including no chest pain, no abdominal pain, she denies difficulty breathing. We will place IVC, obtain labs, give IV fluids, consult psychiatry. Case discussed with [facility], recommend supportive care for anticholinergic symptoms that could be related to dicyclomine as well as symptomatic treatment for hypotension and/or tachycardia.  ----------------------------------------- 4:25 AM on 07/04/2015 -----------------------------------------  Patient now appears more somnolent. She does awaken to touch at this point but she is intermittent mildly hypoxic and hypotensive though hypotension is  fluid responsive.  O2 sat 95% on 2 L nasal cannula. I discussed the case with Dr. Sheryle Hail, hospitalist for admission. ____________________________________________  FINAL CLINICAL IMPRESSION(S) / ED DIAGNOSES  Final diagnoses:  Suicidal ideation  Intentional overdose of drug in tablet form (HCC)  Somnolence      Gayla Doss, MD 07/04/15 [phone]

## 2015-07-04 NOTE — Progress Notes (Addendum)
   07/04/15 0826  Vitals  Temp 98.1 F (36.7 C)  Temp Source Axillary  BP (!) 88/57 mmHg  BP Location Right Arm  BP Method Automatic  Pulse Rate 89  Pulse Rate Source Monitor  Resp 18  Oxygen Therapy  SpO2 100 %  O2 Device Nasal Cannula  Height and Weight  Weight 229 lb 3.2 oz (103.964 kg)  Type of Scale Used Bed  Type of Weight Actual   Made Dr. Nemiah Commander aware of pt's current BP.  No new orders at this time.  PT currently has IVF infusing at 125/hr.  Pt is A&O.  Will continue to monitor.  Orson Ape, RN

## 2015-07-04 NOTE — ED Notes (Signed)
Per Gregary Cromer, pt needs to be held until sitter available. Per [facility] sitter is assigned at this time for 0700.

## 2015-07-04 NOTE — Progress Notes (Signed)
Spoke with lab about UA complete.  Can be added to urine they already have.  Orson Ape, RN

## 2015-07-04 NOTE — ED Notes (Signed)
David from poison control called for follow up on pt.

## 2015-07-04 NOTE — Consult Note (Signed)
WOC ostomy consult note Stoma type/location: RLQ ileostomy Stomal assessment/size: 1 and 1/8 inch, round, moist, budded Peristomal assessment: intact, clear Treatment options for stomal/peristomal skin: none needed Output: thin, dark brown/green effluent Ostomy pouching: 1pc.flat flexible pouching system, [facility] [place] [phone] Education provided: None today; patient is sleeping and lethargic upon awakening.  Sitter in room. Enrolled patient in [employer] DC program: No.  Established ostomy. WOC nursing team will not follow, but will remain available to this patient, the nursing and medical teams.  Please re-consult if needed. Thanks, Ladona Mow, MSN, RN, GNP, [place]  Pager# [phone]

## 2015-07-04 NOTE — ED Notes (Signed)
Pt reports that she was trying to hurt herself tonight. Pt states that she took about 20-30 Isosorbide Dinitrate, about 30 Alprazolam, and finished off the last six of her Dicyclomide. Pt reports depression over several life struggles that have happened recently. Pt is responding appropriately at this time.

## 2015-07-04 NOTE — Progress Notes (Signed)
[facility] [place] at [facility]   PATIENT NAME: Judy Gibbs    MR#:  [id number]  DATE OF BIRTH:  10-Sep-1965  SUBJECTIVE:  CHIEF COMPLAINT:   Chief Complaint  Patient presents with  . Drug Overdose   - Admitted after overdose with dicyclomine, Isordil and Xanax.  - Depressed per family, BP low normal- responding to fluids now - Patient lethargic- arousable and answering some questions  REVIEW OF SYSTEMS:  Review of Systems  Constitutional: Negative for fever and chills.  HENT: Negative for ear discharge, ear pain and nosebleeds.   Eyes: Negative for blurred vision and double vision.  Respiratory: Negative for cough, shortness of breath and wheezing.   Cardiovascular: Negative for chest pain, palpitations and leg swelling.  Gastrointestinal: Negative for nausea, vomiting, abdominal pain, diarrhea and constipation.  Genitourinary: Negative for dysuria and urgency.  Musculoskeletal: Negative for myalgias.  Neurological: Positive for weakness. Negative for dizziness, sensory change, speech change, focal weakness, seizures and headaches.  Psychiatric/Behavioral: Positive for depression and suicidal ideas.    DRUG ALLERGIES:   Allergies  Allergen Reactions  . Celecoxib Hives  . Reglan [Metoclopramide] Anxiety    VITALS:  Blood pressure 88/57, pulse 89, temperature 98.1 F (36.7 C), temperature source Axillary, resp. rate 18, height  (1.676 m), weight 103.964 kg (229 lb 3.2 oz), SpO2 100 %.  PHYSICAL EXAMINATION:  Physical Exam  GENERAL:  50 y.o.-year-old patient lying in the bed with no acute distress.  EYES: Pupils equal, round, reactive to light and accommodation. No scleral icterus. Extraocular muscles intact.  HEENT: Head atraumatic, normocephalic. Oropharynx and nasopharynx clear.  NECK:  Supple, no jugular venous distention. No thyroid enlargement, no tenderness.  LUNGS: Normal breath sounds bilaterally, no wheezing, rales,rhonchi or  crepitation. No use of accessory muscles of respiration.  CARDIOVASCULAR: S1, S2 normal. No murmurs, rubs, or gallops.  ABDOMEN: Soft, nontender, nondistended. Bowel sounds present. No organomegaly or mass.  EXTREMITIES: No pedal edema, cyanosis, or clubbing.  NEUROLOGIC: Cranial nerves II through XII are intact. Muscle strength 5/5 in all extremities. Sensation intact. Gait not checked.  PSYCHIATRIC: The patient is arousable and following some commands. Seems to be oriented 2-3.  SKIN: No obvious rash, lesion, or ulcer.    LABORATORY PANEL:   CBC  Recent Labs Lab 07/04/15 0206  WBC 9.2  HGB 12.5  HCT 36.0  PLT 232   ------------------------------------------------------------------------------------------------------------------  Chemistries   Recent Labs Lab 07/04/15 0206  NA 141  K 4.0  CL 109  CO2 25  GLUCOSE 109*  BUN 18  CREATININE 0.99  CALCIUM 8.8*  AST 22  ALT 23  ALKPHOS 120  BILITOT 0.7   ------------------------------------------------------------------------------------------------------------------  Cardiac Enzymes No results for input(s): TROPONINI in the last 168 hours. ------------------------------------------------------------------------------------------------------------------  RADIOLOGY:  Dg Chest Portable 1 View  07/04/2015  CLINICAL DATA:  Status post overdose. Cough and wheezing, acute onset. Initial encounter. EXAM: PORTABLE CHEST 1 VIEW COMPARISON:  Chest radiograph performed 08/24/2014 FINDINGS: The lungs are hypoexpanded. Minimal left basilar atelectasis is noted. There is no evidence of pleural effusion or pneumothorax. The cardiomediastinal silhouette is within normal limits. No acute osseous abnormalities are seen. IMPRESSION: Lungs hypoexpanded, with minimal left basilar atelectasis. Electronically Signed   By: Roanna Raider M.D.   On: 07/04/2015 04:03    EKG:   Orders placed or performed during the hospital encounter of  07/04/15  . EKG 12-Lead  . EKG 12-Lead  . ED EKG  . ED EKG  ASSESSMENT AND PLAN:   50 year old female with past medical history significant for depression and anxiety, fibromyalgia, GERD, gastroparesis, postural orthostatic tachycardia syndrome presents to the hospital after she was found unresponsive.  #1 drug overdose-intentional- overdosed on Isordil, Xanax and also dicyclomine - short acting meds, should clear off easily, normal renal function - watch for hypotension- responding to fluids - cont IV fluids, more alert now - on off unit tele - psych consulted  #2 major depression with suicidal ideation-overdosed on Xanax. Hold her trazodone, and Xanax at this time. -On suicidal precautions. Psychiatry is consulted. - sitter in the room Likely will be discharged to behavioural medicine floor  #3 GERD- PPI  #4 DVT prophylaxis- Lovenox    All the records are reviewed and case discussed with Care Management/Social Workerr. Management plans discussed with the patient, family and they are in agreement.  CODE STATUS: Full Code  TOTAL TIME TAKING CARE OF THIS PATIENT: 37 minutes.   POSSIBLE D/C IN 1-2 DAYS, DEPENDING ON CLINICAL CONDITION.   Enid Baas M.D on 07/04/2015 at 9:59 AM  Between 7am to 6pm - Pager - [phone]  After 6pm go to [email or web address] - password [email or web address] [facility]  [facility]  Office  [phone]  CC: Primary care physician; Nicki Reaper, NP

## 2015-07-04 NOTE — BH Assessment (Signed)
Assessment Note  Judy Gibbs is an 50 y.o. female presenting to the ED after an intentional overdose.  Pt reportedly took 20-30 Isosorbide Dinitrate, 30 Alprazolam and her remaining 6 Dicyclomine.  Pt reports months of worsening depression.    Pt's  daughter, Lamae Fosco [phone], reports that pt was doing ok right before dinner.  Ms. Abplanalp states that she went to bed and was awaken by her father hours later.  Ms. Stegemann states that her mother was incoherent.  Ms. Eckman that her parents have been having martial problems lately.  She also reports that her mother has chronic health issues including chronic pain and gastrointestinal issues.  Ms. Karch states that she has tried to persuade pt to seek psychiatric health but states that her mother refused by stating that therapy is helping her.  Diagnosis: Suicidal/Drug Overdose  Past Medical History:  Past Medical History  Diagnosis Date  . DDD (degenerative disc disease), cervical   . Anxiety   . Depression   . Fibromyalgia   . GERD (gastroesophageal reflux disease)   . Raynauds phenomenon   . Gastroparesis   . Colonic inertia   . Proctalgia   . Pelvic floor dysfunction   . Hypoglycemia   . Interstitial cystitis   . Obesity   . Vertigo   . POTS (postural orthostatic tachycardia syndrome)     Past Surgical History  Procedure Laterality Date  . Cholecystectomy    . Hysterectomy - unknown type    . Total knee arthroplasty  88  & 10    R & L  . Colonoscopy  08/08/2001    normal, Dr. Lina Sar  . Upper gastrointestinal endoscopy  06/15/2006    hiatal hernia, gastritis  . Esophagogastroduodenoscopy  12/20/1993    mild antral gastritis, Dr. Lina Sar  . Edg  06/28/1987    Dr. Matthias Hughs, bile in stomach  . Upper gastrointestinal endoscopy  07/04/2001    Dr. Lina Sar  . Diverting ileostomy  2013  . Abdominal hysterectomy  2001    has one ovary  . Ileostomy revision      Family History:  Family History  Problem Relation  Age of Onset  . Colon polyps Paternal Uncle   . Esophageal cancer Paternal Uncle   . Diabetes Father   . Heart disease Father   . Stroke Father   . Hyperlipidemia Father   . Hyperlipidemia Mother   . Cancer Paternal Aunt     breast  . Early death Neg Hx     Social History:  reports that she quit smoking about 33 years ago. She has never used smokeless tobacco. She reports that she does not drink alcohol or use illicit drugs.  Additional Social History:  Alcohol / Drug Use History of alcohol / drug use?: No history of alcohol / drug abuse  CIWA: CIWA-Ar BP: (!) 86/58 mmHg Pulse Rate: 94 COWS:    Allergies:  Allergies  Allergen Reactions  . Celecoxib Hives  . Reglan [Metoclopramide] Anxiety    Home Medications:  (Not in a hospital admission)  OB/GYN Status:  No LMP recorded. Patient has had a hysterectomy.  General Assessment Data Location of Assessment: [facility] ED TTS Assessment: In system Is this a Tele or Face-to-Face Assessment?: Face-to-Face Is this an Initial Assessment or a Re-assessment for this encounter?: Initial Assessment Marital status: Married [place] name: N/A Is patient pregnant?: No Pregnancy Status: No Living Arrangements: Parent, Children Can pt return to current living arrangement?: Yes Admission Status:  Involuntary Is patient capable of signing voluntary admission?: Yes Referral Source: Self/Family/Friend Insurance type: Engineer, drilling Care Plan Living Arrangements: Parent, Children Legal Guardian: Other: (self) Name of Psychiatrist: None reported Name of Therapist: None reported  Education Status Is patient currently in school?: No Current Grade: N/A Highest grade of school patient has completed: N/A Name of school: N/A Contact person: N/A  Risk to self with the past 6 months Suicidal Ideation: Yes-Currently Present Has patient been a risk to self within the past 6 months prior to admission? : No Suicidal Intent:  Yes-Currently Present Has patient had any suicidal intent within the past 6 months prior to admission? : No Is patient at risk for suicide?: Yes Suicidal Plan?: Yes-Currently Present Has patient had any suicidal plan within the past 6 months prior to admission? : No Specify Current Suicidal Plan: Pt took an overdose of prescription pills Access to Means: Yes Specify Access to Suicidal Means: Pt has access to pills What has been your use of drugs/alcohol within the last 12 months?: None reported Previous Attempts/Gestures: Yes How many times?: 2 Other Self Harm Risks: None reported Triggers for Past Attempts: Spouse contact, Family contact Intentional Self Injurious Behavior: None Family Suicide History: No Recent stressful life event(s): Conflict (Comment), Recent negative physical changes (Conflict with spouse; chronic pain) Persecutory voices/beliefs?: No Depression: Yes Depression Symptoms: Feeling worthless/self pity, Loss of interest in usual pleasures Substance abuse history and/or treatment for substance abuse?: No Suicide prevention information given to non-admitted patients: Not applicable  Risk to Others within the past 6 months Homicidal Ideation: No Does patient have any lifetime risk of violence toward others beyond the six months prior to admission? : No Thoughts of Harm to Others: No Current Homicidal Intent: No Current Homicidal Plan: No Access to Homicidal Means: No Identified Victim: None identified History of harm to others?: No Assessment of Violence: None Noted Violent Behavior Description: None identfied Does patient have access to weapons?: No Criminal Charges Pending?: No Does patient have a court date: No Is patient on probation?: No  Psychosis Hallucinations: None noted Delusions: None noted  Mental Status Report Appearance/Hygiene: In hospital gown Eye Contact: Poor Motor Activity: Unremarkable Speech: Slurred Level of Consciousness:  Drowsy Mood: Depressed Affect: Depressed Anxiety Level: Minimal Thought Processes: Relevant, Circumstantial Judgement: Partial Orientation: Person, Place Obsessive Compulsive Thoughts/Behaviors: None  Cognitive Functioning Concentration: Fair Memory: Recent Intact IQ: Average Insight: Poor Impulse Control: Poor Appetite: Fair Weight Loss: 0 Weight Gain: 0 Sleep: No Change Total Hours of Sleep: 6 Vegetative Symptoms: None  ADLScreening [facility] Assessment Services) Patient's cognitive ability adequate to safely complete daily activities?: Yes Patient able to express need for assistance with ADLs?: Yes Independently performs ADLs?: Yes (appropriate for developmental age)  Prior Inpatient Therapy Prior Inpatient Therapy: Yes Prior Therapy Dates: 1991,2000 Prior Therapy Facilty/Provider(s): [facility] Reason for Treatment: depression  Prior Outpatient Therapy Prior Outpatient Therapy: No Prior Therapy Dates: N/A Prior Therapy Facilty/Provider(s): N/A Reason for Treatment: N/A Does patient have an ACCT team?: No Does patient have Intensive In-House Services?  : No Does patient have [facility] services? : No Does patient have P4CC services?: No  ADL Screening (condition at time of admission) Patient's cognitive ability adequate to safely complete daily activities?: Yes Patient able to express need for assistance with ADLs?: Yes Independently performs ADLs?: Yes (appropriate for developmental age)       Abuse/Neglect Assessment (Assessment to be complete while patient is alone) Physical Abuse: Denies  Verbal Abuse: Denies Sexual Abuse: Denies Exploitation of patient/patient's resources: Denies Self-Neglect: Denies Values / Beliefs Cultural Requests During Hospitalization: None Spiritual Requests During Hospitalization: None Consults Spiritual Care Consult Needed: No Social Work Consult Needed: No Merchant navy officer (For Healthcare) Does patient have an  advance directive?: No Would patient like information on creating an advanced directive?: No - patient declined information    Additional Information 1:1 In Past 12 Months?: No CIRT Risk: No Elopement Risk: No Does patient have medical clearance?: No     Disposition:  Disposition Initial Assessment Completed for this Encounter: Yes Disposition of Patient: Other dispositions Other disposition(s): Other (Comment) (Psych MD consult)  On Site Evaluation by:   Reviewed with Physician:    [place] 07/04/2015 3:35 AM

## 2015-07-04 NOTE — ED Notes (Signed)
Report called to [facility]C [facility]. Awaiting AC to discuss need for sitter at bedside.

## 2015-07-05 DIAGNOSIS — F332 Major depressive disorder, recurrent severe without psychotic features: Secondary | ICD-10-CM

## 2015-07-05 DIAGNOSIS — G894 Chronic pain syndrome: Secondary | ICD-10-CM

## 2015-07-05 LAB — BASIC METABOLIC PANEL
Anion gap: 0 — ABNORMAL LOW (ref 5–15)
BUN: 10 mg/dL (ref 6–20)
CALCIUM: 8 mg/dL — AB (ref 8.9–10.3)
CHLORIDE: 117 mmol/L — AB (ref 101–111)
CO2: 26 mmol/L (ref 22–32)
CREATININE: 0.83 mg/dL (ref 0.44–1.00)
GFR calc Af Amer: >60 mL/min (ref >60)
GFR calc non Af Amer: >60 mL/min (ref >60)
Glucose, Bld: 87 mg/dL (ref 65–99)
Potassium: 3.5 mmol/L (ref 3.5–5.1)
SODIUM: 143 mmol/L (ref 135–145)

## 2015-07-05 MED ORDER — ISOSORBIDE DINITRATE 10 MG PO TABS
10.0000 mg | ORAL_TABLET | Freq: Three times a day (TID) | ORAL | Status: DC
  Administered 2015-07-05 – 2015-07-06 (×4): 10 mg via ORAL
  Filled 2015-07-05 (×4): qty 1

## 2015-07-05 MED ORDER — SUCRALFATE 1 GM/10ML PO SUSP
1.0000 g | Freq: Two times a day (BID) | ORAL | Status: DC
  Administered 2015-07-05 – 2015-07-06 (×2): 1 g via ORAL
  Filled 2015-07-05 (×2): qty 10

## 2015-07-05 MED ORDER — PANTOPRAZOLE SODIUM 40 MG PO TBEC
40.0000 mg | DELAYED_RELEASE_TABLET | Freq: Two times a day (BID) | ORAL | Status: DC
  Administered 2015-07-05 – 2015-07-06 (×2): 40 mg via ORAL
  Filled 2015-07-05 (×2): qty 1

## 2015-07-05 NOTE — BH Assessment (Signed)
Per Psych MD Dr. Clapacs, pt meets criteria for inpatient placement and is to be admitted to [facility]. Writer awaiting bed placement.  

## 2015-07-05 NOTE — Consult Note (Signed)
[facility] Consult   Reason for Consult:  Consult for this 50 year old woman who was brought to the hospital after taking an overdose with suicidal intent Referring Physician:  Anselm Jungling Patient Identification: Judy Gibbs MRN:  [id number] Principal Diagnosis: Severe recurrent major depression without psychotic features [facility]) Diagnosis:   Patient Active Problem List   Diagnosis Date Noted  . Suicidal ideation [R45.851] 07/05/2015  . Severe recurrent major depression without psychotic features ([place]) [F33.2] 07/05/2015  . Chronic pain syndrome [G89.4] 07/05/2015  . Overdose of benzodiazepine [T42.4X1A] 07/04/2015  . 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 History of recurrent pneumonia [Z87.01] 09/20/2014  . Shortness of breath [R06.02] 09/16/2014  . Pulmonary nodule [R91.1] 09/16/2014  . Anxiety and depression [F41.8] 08/07/2013  . 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 Interstitial cystitis [N30.10] 03/24/2011  . GERD [K21.9] 10/28/2009  . Gastroparesis [K31.84] 10/28/2009  . Irritable bowel syndrome [K58.9] 10/28/2009  . FIBROMYALGIA [IMO0001] 10/28/2009  . 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 female patient admitted with "I just feel so guilty".  HPI:  Interview with the patient. Review of chart. Review of old notes such as they are. Review of labs. 50 year old woman brought to the emergency room early morning yesterday after her family found her having taken an overdose. Patient admits to the facts of the situation. She says that she took a large number of her Imdur tablets and also a large number of Xanax and crush them up and took them with water. She admits that she wrote a suicide note. She says it was something she just started thinking about yesterday but she's been very depressed for months possibly years. She states depressed and down all the time. Feels negative and  bad about herself all the time. Feels like her family would be better off without her. Sleep is poor despite multiple medicines. Appetite is currently stable. Patient has chronic pain issues in her abdomen and general pain all over which she blames for a lot of her unhappiness. She states that she has multiple stresses including her pain and the fact that both of her daughters are disabled and feeling guilty that she can't take care of them. She says she occasionally will hear a voice when she gets really anxious but not consistently. Doesn't report other psychotic symptoms. Denies abusing drugs or alcohol. She is prescribed Xanax by her primary care doctor but not any antidepressants and is currently not getting any psychiatric treatment.  Social history: Patient lives with her husband and 2 adult daughters. One of them is apparently mentally ill and the other one has abdominal problems the same as the patient and is very disabled. Patient doesn't work outside the home. Has chronic pain. She says her relationship with her husband's good except that she feels guilty about how much work he has to do.  Medical history: Patient tells me she has an unusual medical problem in which her blood vessels can't get enough blood to all of her organs which causes damage to her GI tract. Apparently she's had an ileostomy in the past as well. I'm not sure I quite understand all of it. She is diagnosed with colonic inertia and fibromyalgia in the chart as well as irritable bowel syndrome. Her [place] substance abuse history: Does not drink. Says she doesn't abuse any other drugs. Her drug screen was positive for amphetamines and she is not prescribed  any but she absolutely denied to me having taken any amphetamines or similar medicines.  Past Psychiatric History: Patient states she's had a psychiatric hospitalization in the past probably about 18 years ago when her youngest daughter was a child. Patient also made a suicide  attempt at that time but says that it was not as serious as this one. She was prescribed medicines in the past including Prozac lamotrigine and risperidone. She was diagnosed with bipolar disorder by Dr. Candis Schatz despite the fact that she's never had a manic episode. Hasn't been getting any psychiatric or mental health treatment in the time since.  Risk to Self: Suicidal Ideation: Yes-Currently Present Suicidal Intent: Yes-Currently Present Is patient at risk for suicide?: Yes Suicidal Plan?: Yes-Currently Present Specify Current Suicidal Plan: Pt took an overdose of prescription pills Access to Means: Yes Specify Access to Suicidal Means: Pt has access to pills What has been your use of drugs/alcohol within the last 12 months?: None reported How many times?: 2 Other Self Harm Risks: None reported Triggers for Past Attempts: Spouse contact, Family contact Intentional Self Injurious Behavior: None Risk to Others: Homicidal Ideation: No Thoughts of Harm to Others: No Current Homicidal Intent: No Current Homicidal Plan: No Access to Homicidal Means: No Identified Victim: None identified History of harm to others?: No Assessment of Violence: None Noted Violent Behavior Description: None identfied Does patient have access to weapons?: No Criminal Charges Pending?: No Does patient have a court date: No Prior Inpatient Therapy: Prior Inpatient Therapy: Yes Prior Therapy Dates: 1991,2000 Prior Therapy Facilty/Provider(s): [facility], [facility] Reason for Treatment: depression Prior Outpatient Therapy: Prior Outpatient Therapy: No Prior Therapy Dates: N/A Prior Therapy Facilty/Provider(s): N/A Reason for Treatment: N/A Does patient have an ACCT team?: No Does patient have Intensive In-House Services?  : No Does patient have [facility] services? : No Does patient have P4CC services?: No  Past Medical History:  Past Medical History  Diagnosis Date  . DDD (degenerative disc  disease), cervical   . Anxiety   . Depression   . Fibromyalgia   . GERD (gastroesophageal reflux disease)   . Raynauds phenomenon   . Gastroparesis   . Colonic inertia   . Proctalgia   . Pelvic floor dysfunction   . Hypoglycemia   . Interstitial cystitis   . Obesity   . Vertigo   . POTS (postural orthostatic tachycardia syndrome)     Past Surgical History  Procedure Laterality Date  . Cholecystectomy    . Hysterectomy - unknown type    . Total knee arthroplasty  89  & 10    R & L  . Colonoscopy  08/08/2001    normal, Dr. Delfin Edis  . Upper gastrointestinal endoscopy  06/15/2006    hiatal hernia, gastritis  . Esophagogastroduodenoscopy  12/20/1993    mild antral gastritis, Dr. Delfin Edis  . Edg  06/28/1987    Dr. Cristina Gong, bile in stomach  . Upper gastrointestinal endoscopy  07/04/2001    Dr. Delfin Edis  . Diverting ileostomy  2013  . Abdominal hysterectomy  2001    has one ovary  . Ileostomy revision     Family History:  Family History  Problem Relation Age of Onset  . Colon polyps Paternal Uncle   . Esophageal cancer Paternal Uncle   . Diabetes Father   . Heart disease Father   . Stroke Father   . Hyperlipidemia Father   . Hyperlipidemia Mother   . Cancer Paternal Aunt  breast  . Early death Neg Hx    Family Psychiatric  History: Patient states an extensive family history of depression including a grandfather who committed suicide both of her parents with depression and multiple other people with depression in her family as well as mental health problems and one of her daughters Social History:  History  Alcohol Use No     History  Drug Use No    Social History   Social History  . Marital Status: Married    Spouse Name: N/A  . Number of Children: 2  . Years of Education: N/A   Occupational History  . homemaker    Social History Main Topics  . Smoking status: Former Smoker    Quit date: 05/22/1982  . Smokeless tobacco: Never Used  . Alcohol  Use: No  . Drug Use: No  . Sexual Activity: Yes    Birth Control/ Protection: Surgical   Other Topics Concern  . None   Social History Narrative   Additional Social History:    Allergies:   Allergies  Allergen Reactions  . Celecoxib Hives  . Reglan [Metoclopramide] Anxiety    Labs:  Results for orders placed or performed during the hospital encounter of 07/04/15 (from the past 48 hour(s))  CBC with Differential     Status: Abnormal   Collection Time: 07/04/15  2:06 AM  Result Value Ref Range   WBC 9.2 3.6 - 11.0 K/uL   RBC 4.61 3.80 - 5.20 MIL/uL   Hemoglobin 12.5 12.0 - 16.0 g/dL   HCT 36.0 35.0 - 47.0 %   MCV 78.2 (L) 80.0 - 100.0 fL   MCH 27.0 26.0 - 34.0 pg   MCHC 34.5 32.0 - 36.0 g/dL   RDW 15.6 (H) 11.5 - 14.5 %   Platelets 232 150 - 440 K/uL   Neutrophils Relative % 81 %   Neutro Abs 7.5 (H) 1.4 - 6.5 K/uL   Lymphocytes Relative 12 %   Lymphs Abs 1.1 1.0 - 3.6 K/uL   Monocytes Relative 5 %   Monocytes Absolute 0.5 0.2 - 0.9 K/uL   Eosinophils Relative 1 %   Eosinophils Absolute 0.1 0 - 0.7 K/uL   Basophils Relative 1 %   Basophils Absolute 0.0 0 - 0.1 K/uL  Comprehensive metabolic panel     Status: Abnormal   Collection Time: 07/04/15  2:06 AM  Result Value Ref Range   Sodium 141 135 - 145 mmol/L   Potassium 4.0 3.5 - 5.1 mmol/L   Chloride 109 101 - 111 mmol/L   CO2 25 22 - 32 mmol/L   Glucose, Bld 109 (H) 65 - 99 mg/dL   BUN 18 6 - 20 mg/dL   Creatinine, Ser 0.99 0.44 - 1.00 mg/dL   Calcium 8.8 (L) 8.9 - 10.3 mg/dL   Total Protein 6.4 (L) 6.5 - 8.1 g/dL   Albumin 3.7 3.5 - 5.0 g/dL   AST 22 15 - 41 U/L   ALT 23 14 - 54 U/L   Alkaline Phosphatase 120 38 - 126 U/L   Total Bilirubin 0.7 0.3 - 1.2 mg/dL   GFR calc non Af Amer >60 >60 mL/min   GFR calc Af Amer >60 >60 mL/min    Comment: (NOTE) The eGFR has been calculated using the CKD EPI equation. This calculation has not been validated in all clinical situations. eGFR's persistently <60 mL/min  signify possible Chronic Kidney Disease.    Anion gap 7 5 - 15  Ethanol  Status: None   Collection Time: 07/04/15  2:06 AM  Result Value Ref Range   Alcohol, Ethyl (B) <5 <5 mg/dL    Comment:        LOWEST DETECTABLE LIMIT FOR SERUM ALCOHOL IS 5 mg/dL FOR MEDICAL PURPOSES ONLY   Salicylate level     Status: None   Collection Time: 07/04/15  2:06 AM  Result Value Ref Range   Salicylate Lvl <3.8 2.8 - 30.0 mg/dL  Acetaminophen level     Status: Abnormal   Collection Time: 07/04/15  2:06 AM  Result Value Ref Range   Acetaminophen (Tylenol), Serum <10 (L) 10 - 30 ug/mL    Comment:        THERAPEUTIC CONCENTRATIONS VARY SIGNIFICANTLY. A RANGE OF 10-30 ug/mL MAY BE AN EFFECTIVE CONCENTRATION FOR MANY PATIENTS. HOWEVER, SOME ARE BEST TREATED AT CONCENTRATIONS OUTSIDE THIS RANGE. ACETAMINOPHEN CONCENTRATIONS >150 ug/mL AT 4 HOURS AFTER INGESTION AND >50 ug/mL AT 12 HOURS AFTER INGESTION ARE OFTEN ASSOCIATED WITH TOXIC REACTIONS.   TSH     Status: None   Collection Time: 07/04/15  2:06 AM  Result Value Ref Range   TSH 2.343 0.350 - 4.500 uIU/mL  Hemoglobin A1c     Status: None   Collection Time: 07/04/15  2:06 AM  Result Value Ref Range   Hgb A1c MFr Bld 4.6 4.0 - 6.0 %  Urine Drug Screen, Qualitative ([facility] only)     Status: Abnormal   Collection Time: 07/04/15  3:24 AM  Result Value Ref Range   Tricyclic, Ur Screen NONE DETECTED NONE DETECTED   Amphetamines, Ur Screen POSITIVE (A) NONE DETECTED   MDMA (Ecstasy)Ur Screen NONE DETECTED NONE DETECTED   Cocaine Metabolite,Ur [place] NONE DETECTED NONE DETECTED   Opiate, Ur Screen NONE DETECTED NONE DETECTED   Phencyclidine (PCP) Ur S NONE DETECTED NONE DETECTED   Cannabinoid 50 Ng, Ur [place] NONE DETECTED NONE DETECTED   Barbiturates, Ur Screen NONE DETECTED NONE DETECTED   Benzodiazepine, Ur Scrn POSITIVE (A) NONE DETECTED   Methadone Scn, Ur NONE DETECTED NONE DETECTED    Comment: (NOTE) 101  Tricyclics, urine                Cutoff 1000 ng/mL 200  Amphetamines, urine             Cutoff 1000 ng/mL 300  MDMA (Ecstasy), urine           Cutoff 500 ng/mL 400  Cocaine Metabolite, urine       Cutoff 300 ng/mL 500  Opiate, urine                   Cutoff 300 ng/mL 600  Phencyclidine (PCP), urine      Cutoff 25 ng/mL 700  Cannabinoid, urine              Cutoff 50 ng/mL 800  Barbiturates, urine             Cutoff 200 ng/mL 900  Benzodiazepine, urine           Cutoff 200 ng/mL 1000 Methadone, urine                Cutoff 300 ng/mL 1100 1200 The urine drug screen provides only a preliminary, unconfirmed 1300 analytical test result and should not be used for non-medical 1400 purposes. Clinical consideration and professional judgment should 1500 be applied to any positive drug screen result due to possible 1600 interfering substances. A more specific alternate chemical method 1700  must be used in order to obtain a confirmed analytical result.  1800 Gas chromato graphy / mass spectrometry (GC/MS) is the preferred 1900 confirmatory method.   Urinalysis complete, with microscopic ([facility] only)     Status: Abnormal   Collection Time: 07/04/15  3:24 AM  Result Value Ref Range   Color, Urine YELLOW (A) YELLOW   APPearance TURBID (A) CLEAR   Glucose, UA NEGATIVE NEGATIVE mg/dL   Bilirubin Urine NEGATIVE NEGATIVE   Ketones, ur NEGATIVE NEGATIVE mg/dL   Specific Gravity, Urine 1.029 1.005 - 1.030   Hgb urine dipstick NEGATIVE NEGATIVE   pH 6.0 5.0 - 8.0   Protein, ur 30 (A) NEGATIVE mg/dL   Nitrite NEGATIVE NEGATIVE   Leukocytes, UA NEGATIVE NEGATIVE   RBC / HPF 6-30 0 - 5 RBC/hpf   WBC, UA NONE SEEN 0 - 5 WBC/hpf   Bacteria, UA RARE (A) NONE SEEN   Squamous Epithelial / LPF NONE SEEN NONE SEEN   Mucous PRESENT    Ca Oxalate Crys, UA PRESENT   Basic metabolic panel     Status: Abnormal   Collection Time: 07/05/15  4:36 AM  Result Value Ref Range   Sodium 143 135 - 145 mmol/L    Comment: RESULTS VERIFIED BY REPEAT  TESTING   Potassium 3.5 3.5 - 5.1 mmol/L   Chloride 117 (H) 101 - 111 mmol/L   CO2 26 22 - 32 mmol/L   Glucose, Bld 87 65 - 99 mg/dL   BUN 10 6 - 20 mg/dL   Creatinine, Ser 1.78 0.44 - 1.00 mg/dL   Calcium 8.0 (L) 8.9 - 10.3 mg/dL   GFR calc non Af Amer >60 >60 mL/min   GFR calc Af Amer >60 >60 mL/min    Comment: (NOTE) The eGFR has been calculated using the CKD EPI equation. This calculation has not been validated in all clinical situations. eGFR's persistently <60 mL/min signify possible Chronic Kidney Disease.    Anion gap 0 (L) 5 - 15    Current Facility-Administered Medications  Medication Dose Route Frequency Provider Last Rate Last Dose  . acetaminophen (TYLENOL) tablet 650 mg  650 mg Oral Q6H PRN Arnaldo Natal, MD   650 mg at 07/04/15 2974   Or  . acetaminophen (TYLENOL) suppository 650 mg  650 mg Rectal Q6H PRN Arnaldo Natal, MD      . calcium-vitamin D (OSCAL WITH D) 500-200 MG-UNIT per tablet 1 tablet  1 tablet Oral q morning - 10a Gardner Candle, RPH   1 tablet at 07/05/15 1149  . enoxaparin (LOVENOX) injection 40 mg  40 mg Subcutaneous Q24H Arnaldo Natal, MD   40 mg at 07/04/15 1059  . isosorbide dinitrate (ISORDIL) tablet 10 mg  10 mg Oral TID Altamese Dilling, MD   10 mg at 07/05/15 1149  . loperamide (IMODIUM) capsule 2 mg  2 mg Oral QID PRN Arnaldo Natal, MD      . ondansetron [facility]) tablet 4 mg  4 mg Oral Q6H PRN Arnaldo Natal, MD       Or  . ondansetron [facility]) injection 4 mg  4 mg Intravenous Q6H PRN Arnaldo Natal, MD      . pantoprazole (PROTONIX) EC tablet 40 mg  40 mg Oral Daily Enid Baas, MD   40 mg at 07/05/15 1149  . sodium chloride flush (NS) 0.9 % injection 3 mL  3 mL Intravenous Q12H Arnaldo Natal, MD   3 mL at 07/05/15  1150    Musculoskeletal: Strength & Muscle Tone: decreased Gait & Station: normal Patient leans: N/A  Psychiatric Specialty Exam: Review of Systems  HENT: Negative.   Eyes: Negative.    Respiratory: Negative.   Cardiovascular: Negative.   Gastrointestinal: Positive for nausea and abdominal pain.  Musculoskeletal: Positive for myalgias.  Skin: Negative.   Neurological: Positive for weakness.  Psychiatric/Behavioral: Positive for depression and suicidal ideas. Negative for hallucinations, memory loss and substance abuse. The patient is nervous/anxious and has insomnia.     Blood pressure 109/75, pulse 92, temperature 98.2 F (36.8 C), temperature source Oral, resp. rate 18, height 5' 6" (1.676 m), weight 97.796 kg (215 lb 9.6 oz), SpO2 97 %.Body mass index is 34.82 kg/(m^2).  General Appearance: Casual  Eye Contact::  Good  Speech:  Clear and Coherent  Volume:  Normal  Mood:  Depressed  Affect:  Congruent  Thought Process:  Goal Directed  Orientation:  Full (Time, Place, and Person)  Thought Content:  Negative  Suicidal Thoughts:  Yes.  with intent/plan  Homicidal Thoughts:  No  Memory:  Immediate;   Good Recent;   Fair Remote;   Fair  Judgement:  Impaired  Insight:  Fair  Psychomotor Activity:  Decreased  Concentration:  Fair  Recall:  [employer] of Knowledge:Fair  Language: Fair  Akathisia:  No  Handed:  Right  AIMS (if indicated):     Assets:  Desire for Improvement Financial Resources/Insurance Housing Intimacy Resilience Social Support  ADL's:  Intact  Cognition: WNL  Sleep:      Treatment Plan Summary: Medication management and Plan 50 year old woman admitted with what she admits was a serious suicide attempt. Continues to have multiple symptoms of depression. Hopelessness negativity about her self excessive guilt. Although she has occasional auditory hallucination she does not appear to be actively psychotic. Patient requires inpatient hospital treatment because of her suicide attempt. Medically she appears to be stable. Suicidal ideation ongoing problem continue 15 minute checks continue commitment paperwork. Patient will continue on her medicine  for multiple medical problems. She will be admitted to the ward downstairs for medications can be considered for her depression and further diagnosis and treatment pursued. She understands and agrees to the plan. Case discussed with TTS.  Disposition: Recommend psychiatric Inpatient admission when medically cleared. Supportive therapy provided about ongoing stressors.  Alethia Berthold, MD 07/05/2015 12:20 PM

## 2015-07-05 NOTE — Progress Notes (Signed)
[facility] [place] [facility]   PATIENT NAME: Judy Gibbs    MR#:  [id number]  DATE OF BIRTH:  12/06/1965  SUBJECTIVE:  CHIEF COMPLAINT:   Chief Complaint  Patient presents with  . Drug Overdose   - Admitted after overdose with dicyclomine, Isordil and Xanax.  - Depressed, BP  normal- responded to fluids now  REVIEW OF SYSTEMS:  Review of Systems  Constitutional: Negative for fever and chills.  HENT: Negative for ear discharge, ear pain and nosebleeds.   Eyes: Negative for blurred vision and double vision.  Respiratory: Negative for cough, shortness of breath and wheezing.   Cardiovascular: Negative for chest pain, palpitations and leg swelling.  Gastrointestinal: Negative for nausea, vomiting, abdominal pain, diarrhea and constipation.  Genitourinary: Negative for dysuria and urgency.  Musculoskeletal: Negative for myalgias.  Neurological: Positive for weakness. Negative for dizziness, sensory change, speech change, focal weakness, seizures and headaches.  Psychiatric/Behavioral: Positive for depression and suicidal ideas.    DRUG ALLERGIES:   Allergies  Allergen Reactions  . Celecoxib Hives  . Reglan [Metoclopramide] Anxiety    VITALS:  Blood pressure 121/71, pulse 83, temperature 97.7 F (36.5 C), temperature source Oral, resp. rate 18, height  (1.676 m), weight 97.796 kg (215 lb 9.6 oz), SpO2 97 %.  PHYSICAL EXAMINATION:  Physical Exam  GENERAL:  50 y.o.-year-old patient lying in the bed with no acute distress.  EYES: Pupils equal, round, reactive to light and accommodation. No scleral icterus. Extraocular muscles intact.  HEENT: Head atraumatic, normocephalic. Oropharynx and nasopharynx clear.  NECK:  Supple, no jugular venous distention. No thyroid enlargement, no tenderness.  LUNGS: Normal breath sounds bilaterally, no wheezing, rales,rhonchi or crepitation. No use of accessory muscles of respiration.  CARDIOVASCULAR: S1,  S2 normal. No murmurs, rubs, or gallops.  ABDOMEN: Soft, nontender, nondistended. Bowel sounds present. No organomegaly or mass.  EXTREMITIES: No pedal edema, cyanosis, or clubbing.  NEUROLOGIC: Cranial nerves II through XII are intact. Muscle strength 5/5 in all extremities. Sensation intact. Gait not checked.  PSYCHIATRIC: The patient is alert and oriented, have depression. SKIN: No obvious rash, lesion, or ulcer.    LABORATORY PANEL:   CBC  Recent Labs Lab 07/04/15 0206  WBC 9.2  HGB 12.5  HCT 36.0  PLT 232   ------------------------------------------------------------------------------------------------------------------  Chemistries   Recent Labs Lab 07/04/15 0206 07/05/15 0436  NA 141 143  K 4.0 3.5  CL 109 117*  CO2 25 26  GLUCOSE 109* 87  BUN 18 10  CREATININE 0.99 0.83  CALCIUM 8.8* 8.0*  AST 22  --   ALT 23  --   ALKPHOS 120  --   BILITOT 0.7  --    ------------------------------------------------------------------------------------------------------------------  Cardiac Enzymes No results for input(s): TROPONINI in the last 168 hours. ------------------------------------------------------------------------------------------------------------------  RADIOLOGY:  Dg Chest Portable 1 View  07/04/2015  CLINICAL DATA:  Status post overdose. Cough and wheezing, acute onset. Initial encounter. EXAM: PORTABLE CHEST 1 VIEW COMPARISON:  Chest radiograph performed 08/24/2014 FINDINGS: The lungs are hypoexpanded. Minimal left basilar atelectasis is noted. There is no evidence of pleural effusion or pneumothorax. The cardiomediastinal silhouette is within normal limits. No acute osseous abnormalities are seen. IMPRESSION: Lungs hypoexpanded, with minimal left basilar atelectasis. Electronically Signed   By: Roanna Raider M.D.   On: 07/04/2015 04:03     ASSESSMENT AND PLAN:   50 year old female with past medical history significant for depression and anxiety,  fibromyalgia, GERD, gastroparesis, postural orthostatic tachycardia syndrome  presents to the hospital after she was found unresponsive.  #1 drug overdose-intentional- overdosed on Isordil, Xanax and also dicyclomine - short acting meds,  normal renal function - BP normal now - cont IV fluids, more alert now - on off unit tele - psych consulted , medically stable.  #2 major depression with suicidal ideation-overdosed on Xanax. Hold her trazodone, and Xanax at this time. -On suicidal precautions. Psychiatry is consulted. - sitter in the room Likely will be discharged to behavioural medicine floor  #3 GERD- PPI   She was taking isordil for esophageal spasms- resume now.  #4 DVT prophylaxis- Lovenox    All the records are reviewed and case discussed with Care Management/Social Workerr. Management plans discussed with the patient, family and they are in agreement.  CODE STATUS: Full Code  TOTAL TIME TAKING CARE OF THIS PATIENT: 30 minutes.   POSSIBLE D/C IN 1 DAYS, DEPENDING ON CLINICAL CONDITION.   Altamese Dilling M.D on 07/05/2015 at 9:37 PM  Between 7am to 6pm - Pager - [phone]  After 6pm go to [email or web address] - password [email or web address] [facility]  [facility]  Office  [phone]  CC: Primary care physician; Nicki Reaper, NP

## 2015-07-06 ENCOUNTER — Inpatient Hospital Stay
Admission: EM | Admit: 2015-07-06 | Discharge: 2015-07-08 | DRG: 885 | Disposition: A | Discharge status: Home / Self Care | Payer: 59 | Source: Intra-hospital | Attending: Psychiatry | Admitting: Psychiatry

## 2015-07-06 DIAGNOSIS — K909 Intestinal malabsorption, unspecified: Secondary | ICD-10-CM | POA: Diagnosis present

## 2015-07-06 DIAGNOSIS — Z833 Family history of diabetes mellitus: Secondary | ICD-10-CM

## 2015-07-06 DIAGNOSIS — G894 Chronic pain syndrome: Secondary | ICD-10-CM | POA: Diagnosis present

## 2015-07-06 DIAGNOSIS — Z888 Allergy status to other drugs, medicaments and biological substances status: Secondary | ICD-10-CM | POA: Diagnosis not present

## 2015-07-06 DIAGNOSIS — R197 Diarrhea, unspecified: Secondary | ICD-10-CM | POA: Diagnosis present

## 2015-07-06 DIAGNOSIS — F332 Major depressive disorder, recurrent severe without psychotic features: Secondary | ICD-10-CM | POA: Diagnosis present

## 2015-07-06 DIAGNOSIS — M8580 Other specified disorders of bone density and structure, unspecified site: Secondary | ICD-10-CM | POA: Diagnosis present

## 2015-07-06 DIAGNOSIS — Z90710 Acquired absence of both cervix and uterus: Secondary | ICD-10-CM | POA: Diagnosis not present

## 2015-07-06 DIAGNOSIS — I7300 Raynaud's syndrome without gangrene: Secondary | ICD-10-CM | POA: Diagnosis present

## 2015-07-06 DIAGNOSIS — M797 Fibromyalgia: Secondary | ICD-10-CM

## 2015-07-06 DIAGNOSIS — Z9889 Other specified postprocedural states: Secondary | ICD-10-CM

## 2015-07-06 DIAGNOSIS — K9289 Other specified diseases of the digestive system: Secondary | ICD-10-CM

## 2015-07-06 DIAGNOSIS — K219 Gastro-esophageal reflux disease without esophagitis: Secondary | ICD-10-CM | POA: Diagnosis present

## 2015-07-06 DIAGNOSIS — Z79899 Other long term (current) drug therapy: Secondary | ICD-10-CM | POA: Diagnosis not present

## 2015-07-06 DIAGNOSIS — Z823 Family history of stroke: Secondary | ICD-10-CM

## 2015-07-06 DIAGNOSIS — G4700 Insomnia, unspecified: Secondary | ICD-10-CM | POA: Diagnosis present

## 2015-07-06 DIAGNOSIS — Z932 Ileostomy status: Secondary | ICD-10-CM

## 2015-07-06 DIAGNOSIS — Z8249 Family history of ischemic heart disease and other diseases of the circulatory system: Secondary | ICD-10-CM

## 2015-07-06 DIAGNOSIS — Z9049 Acquired absence of other specified parts of digestive tract: Secondary | ICD-10-CM | POA: Diagnosis not present

## 2015-07-06 DIAGNOSIS — Z800 Family history of malignant neoplasm of digestive organs: Secondary | ICD-10-CM

## 2015-07-06 DIAGNOSIS — K30 Functional dyspepsia: Secondary | ICD-10-CM | POA: Diagnosis present

## 2015-07-06 DIAGNOSIS — Z933 Colostomy status: Secondary | ICD-10-CM

## 2015-07-06 DIAGNOSIS — Z818 Family history of other mental and behavioral disorders: Secondary | ICD-10-CM | POA: Diagnosis not present

## 2015-07-06 DIAGNOSIS — E559 Vitamin D deficiency, unspecified: Secondary | ICD-10-CM | POA: Diagnosis present

## 2015-07-06 DIAGNOSIS — Z96653 Presence of artificial knee joint, bilateral: Secondary | ICD-10-CM | POA: Diagnosis present

## 2015-07-06 DIAGNOSIS — Z803 Family history of malignant neoplasm of breast: Secondary | ICD-10-CM

## 2015-07-06 LAB — URINALYSIS COMPLETE WITH MICROSCOPIC (ARMC ONLY)
BILIRUBIN URINE: NEGATIVE
Bacteria, UA: NONE SEEN
Glucose, UA: NEGATIVE mg/dL
KETONES UR: NEGATIVE mg/dL
NITRITE: NEGATIVE
PH: 6 (ref 5.0–8.0)
Protein, ur: 100 mg/dL — AB
SPECIFIC GRAVITY, URINE: 1.019 (ref 1.005–1.030)

## 2015-07-06 MED ORDER — DICYCLOMINE HCL 20 MG PO TABS
20.0000 mg | ORAL_TABLET | Freq: Three times a day (TID) | ORAL | Status: DC | PRN
  Administered 2015-07-06 – 2015-07-07 (×2): 20 mg via ORAL
  Filled 2015-07-06 (×2): qty 1

## 2015-07-06 MED ORDER — CEPHALEXIN 250 MG PO CAPS
250.0000 mg | ORAL_CAPSULE | Freq: Three times a day (TID) | ORAL | Status: DC
  Filled 2015-07-06: qty 1

## 2015-07-06 MED ORDER — LOPERAMIDE HCL 2 MG PO CAPS: 2.0000 mg | ORAL_CAPSULE | Freq: Four times a day (QID) | ORAL | Status: DC | PRN

## 2015-07-06 MED ORDER — TRAZODONE HCL 100 MG PO TABS
150.0000 mg | ORAL_TABLET | Freq: Every day | ORAL | Status: DC
  Administered 2015-07-07: 150 mg via ORAL
  Filled 2015-07-06 (×2): qty 1

## 2015-07-06 MED ORDER — DEXTROSE 5 % IV SOLN
1.0000 g | INTRAVENOUS | Status: AC
  Administered 2015-07-06: 1 g via INTRAVENOUS
  Filled 2015-07-06: qty 10

## 2015-07-06 MED ORDER — CEPHALEXIN 250 MG PO CAPS: 250.0000 mg | ORAL_CAPSULE | Freq: Three times a day (TID) | ORAL | Status: DC

## 2015-07-06 MED ORDER — ALUM & MAG HYDROXIDE-SIMETH 200-200-20 MG/5ML PO SUSP: 30.0000 mL | ORAL | Status: DC | PRN

## 2015-07-06 MED ORDER — ACETAMINOPHEN 325 MG PO TABS: 650.0000 mg | ORAL_TABLET | Freq: Four times a day (QID) | ORAL | Status: DC | PRN

## 2015-07-06 MED ORDER — CALCIUM CARBONATE-VITAMIN D 500-200 MG-UNIT PO TABS
1.0000 | ORAL_TABLET | Freq: Every morning | ORAL | Status: DC
  Administered 2015-07-08: 1 via ORAL
  Filled 2015-07-06 (×3): qty 1

## 2015-07-06 MED ORDER — SUCRALFATE 1 GM/10ML PO SUSP: 1.0000 g | Freq: Two times a day (BID) | ORAL | Status: AC

## 2015-07-06 MED ORDER — MAGNESIUM HYDROXIDE 400 MG/5ML PO SUSP: 30.0000 mL | Freq: Every day | ORAL | Status: DC | PRN

## 2015-07-06 MED ORDER — ISOSORBIDE DINITRATE 10 MG PO TABS
10.0000 mg | ORAL_TABLET | Freq: Three times a day (TID) | ORAL | Status: DC
  Administered 2015-07-06 – 2015-07-08 (×7): 10 mg via ORAL
  Filled 2015-07-06 (×8): qty 1

## 2015-07-06 NOTE — Progress Notes (Signed)
Pt medically stable to discharge to Behavioral health.  Report called to nurse in behavioral.  Pt will be transported shortly.  Orson Ape, RN

## 2015-07-06 NOTE — Plan of Care (Signed)
Problem: Ineffective individual coping Goal: STG: Patient will remain free from self harm Outcome: Progressing Pt safe on the unit at this time     

## 2015-07-06 NOTE — Progress Notes (Signed)
D: Pt denies SI/HI/AVH. Pt is pleasant and cooperative. Pt stated she felt hopeful today. Pt stated she has been learning to help relax herself with out the medication and that has been helping. Pt is just a little frustrated due to the chronic issues.   A: Pt was offered support and encouragement. Pt was given scheduled medications. Pt was encourage to attend groups. Q 15 minute checks were done for safety. Pt informed to continue to strive for feeling the best she can at that moment.    R:Pt attends groups and interacts well with peers and staff. Pt is taking medication. Pt receptive to treatment and safety maintained on unit.

## 2015-07-06 NOTE — Progress Notes (Signed)
Pt transported to [employer] via wheelchair with sitter and security.  Orson Ape, RN

## 2015-07-06 NOTE — [facility] Group Notes (Signed)
[facility] Group Notes:  (Nursing/MHT/Case Management/Adjunct)  Date:  07/06/2015  Time:  10:15 PM  Type of Therapy:  Group Therapy  Participation Level:  Did Not Attend   Judy Gibbs 07/06/2015, 10:15 PM

## 2015-07-06 NOTE — Discharge Summary (Signed)
[facility] - [place] at [facility]   PATIENT NAME: Judy Gibbs    MR#:  [id number]  DATE OF BIRTH:  12-Jul-1965  DATE OF ADMISSION:  07/04/2015 ADMITTING PHYSICIAN: Arnaldo Natal, MD  DATE OF DISCHARGE: 07/06/2015  PRIMARY CARE PHYSICIAN: Nicki Reaper, NP    ADMISSION DIAGNOSIS:  Suicidal ideation [R45.851] Somnolence [R40.0] Intentional overdose of drug in tablet form (HCC) [T50.902A]  DISCHARGE DIAGNOSIS:  Principal Problem:   Severe recurrent major depression without psychotic features (HCC) Active Problems:   Irritable bowel syndrome   Colonic inertia   Overdose of benzodiazepine   Suicidal ideation   Chronic pain syndrome   UTI  SECONDARY DIAGNOSIS:   Past Medical History  Diagnosis Date  . DDD (degenerative disc disease), cervical   . Anxiety   . Depression   . Fibromyalgia   . GERD (gastroesophageal reflux disease)   . Raynauds phenomenon   . Gastroparesis   . Colonic inertia   . Proctalgia   . Pelvic floor dysfunction   . Hypoglycemia   . Interstitial cystitis   . Obesity   . Vertigo   . POTS (postural orthostatic tachycardia syndrome)     HOSPITAL COURSE:   50 year old female with past medical history significant for depression and anxiety, fibromyalgia, GERD, gastroparesis, postural orthostatic tachycardia syndrome presents to the hospital after she was found unresponsive.  #1 drug overdose-intentional- overdosed on Isordil, Xanax and also dicyclomine - short acting meds, normal renal function - BP normal now - cont IV fluids, more alert now - on off unit tele - psych consulted , medically stable. - psych inpatient admission for suicidal attempt.  #2 major depression with suicidal ideation-overdosed on Xanax. Hold her trazodone, and Xanax at this time. -On suicidal precautions. Psychiatry is consulted. - sitter in the room  discharged to behavioural medicine floor  #3 GERD- PPI  She was taking isordil for  esophageal spasms- resume now.  #4 DVT prophylaxis- Lovenox  #5 UTI   UA is positive, Cx sent.   D/c on oral keflex.   Feels better symptomatically.  DISCHARGE CONDITIONS:   Stable.  CONSULTS OBTAINED:  Treatment Team:  Audery Amel, MD  DRUG ALLERGIES:   Allergies  Allergen Reactions  . Celecoxib Hives  . Reglan [Metoclopramide] Anxiety    DISCHARGE MEDICATIONS:   Current Discharge Medication List    START taking these medications   Details  cephALEXin (KEFLEX) 250 MG capsule Take 1 capsule (250 mg total) by mouth every 8 (eight) hours. Qty: 15 capsule, Refills: 0    sucralfate (CARAFATE) 1 GM/10ML suspension Take 10 mLs (1 g total) by mouth 2 (two) times daily. Qty: 420 mL, Refills: 0      CONTINUE these medications which have NOT CHANGED   Details  !! ALPRAZolam (XANAX) 1 MG tablet Take 1 mg by mouth 2 (two) times daily as needed for anxiety or sleep.    calcium-vitamin D (OSCAL) 250-125 MG-UNIT per tablet Take 2 tablets by mouth every morning.     dicyclomine (BENTYL) 20 MG tablet Take 20 mg by mouth 3 (three) times daily as needed for spasms.    isosorbide dinitrate (ISORDIL) 10 MG tablet Take 10 mg by mouth 3 (three) times daily.    loperamide (IMODIUM) 2 MG capsule Take 2 mg by mouth 4 (four) times daily as needed for diarrhea or loose stools.     Multiple Vitamins-Minerals (ADEKS) chewable tablet Chew 2 tablets by mouth every morning. Multivitamin gummie  omeprazole (PRILOSEC) 40 MG capsule Take 40 mg by mouth 2 (two) times daily.    traZODone (DESYREL) 100 MG tablet Take 1 tablet (100 mg total) by mouth at bedtime. Qty: 30 tablet, Refills: 0    !! ALPRAZolam (XANAX) 1 MG tablet TAKE 1 TABLET BY MOUTH AT BEDTIME AS NEEDED Qty: 30 tablet, Refills: 0     !! - Potential duplicate medications found. Please discuss with provider.    STOP taking these medications     ACCU-CHEK FASTCLIX LANCETS MISC      glucose blood (ACCU-CHEK AVIVA PLUS) test  strip      Tapentadol HCl (NUCYNTA) 100 MG TABS          DISCHARGE INSTRUCTIONS:    Follow with PMD in 2 weeks.  If you experience worsening of your admission symptoms, develop shortness of breath, life threatening emergency, suicidal or homicidal thoughts you must seek medical attention immediately by calling 911 or calling your MD immediately  if symptoms less severe.  You Must read complete instructions/literature along with all the possible adverse reactions/side effects for all the Medicines you take and that have been prescribed to you. Take any new Medicines after you have completely understood and accept all the possible adverse reactions/side effects.   Please note  You were cared for by a hospitalist during your hospital stay. If you have any questions about your discharge medications or the care you received while you were in the hospital after you are discharged, you can call the unit and asked to speak with the hospitalist on call if the hospitalist that took care of you is not available. Once you are discharged, your primary care physician will handle any further medical issues. Please note that NO REFILLS for any discharge medications will be authorized once you are discharged, as it is imperative that you return to your primary care physician (or establish a relationship with a primary care physician if you do not have one) for your aftercare needs so that they can reassess your need for medications and monitor your lab values.    Today   CHIEF COMPLAINT:   Chief Complaint  Patient presents with  . Drug Overdose    HISTORY OF PRESENT ILLNESS:  Judy Gibbs  is a 50 y.o. female presents the emergency department admitting that she had tried to hurt herself by overdosing on Xanax, Imdur and dicyclomine. She admits to being severely depressed. This is not the first time she has tried to harm herself. In the emergency department she was found be very somnolent but  protecting her airway. She will barely awake and to deny chest pain although she admits she has some vague mid epigastric and abdominal pain. Due to her multiple injections and suicidal ideation the emergency department staff called for admission to observe for vaporization prior to inpatient psychiatric therapy.   VITAL SIGNS:  Blood pressure 140/61, pulse 85, temperature 97.8 F (36.6 C), temperature source Oral, resp. rate 20, height  (1.676 m), weight 95.754 kg (211 lb 1.6 oz), SpO2 100 %.  I/O:   Intake/Output Summary (Last 24 hours) at 07/06/15 1157 Last data filed at 07/06/15 1032  Gross per 24 hour  Intake    540 ml  Output   4075 ml  Net  -3535 ml    PHYSICAL EXAMINATION:   GENERAL: 50 y.o.-year-old patient lying in the bed with no acute distress.  EYES: Pupils equal, round, reactive to light and accommodation. No scleral icterus. Extraocular muscles  intact.  HEENT: Head atraumatic, normocephalic. Oropharynx and nasopharynx clear.  NECK: Supple, no jugular venous distention. No thyroid enlargement, no tenderness.  LUNGS: Normal breath sounds bilaterally, no wheezing, rales,rhonchi or crepitation. No use of accessory muscles of respiration.  CARDIOVASCULAR: S1, S2 normal. No murmurs, rubs, or gallops.  ABDOMEN: Soft, nontender, nondistended. Bowel sounds present. No organomegaly or mass.  EXTREMITIES: No pedal edema, cyanosis, or clubbing.  NEUROLOGIC: Cranial nerves II through XII are intact. Muscle strength 5/5 in all extremities. Sensation intact. Gait not checked.  PSYCHIATRIC: The patient is alert and oriented, have depression. SKIN: No obvious rash, lesion, or ulcer.   DATA REVIEW:   CBC  Recent Labs Lab 07/04/15 0206  WBC 9.2  HGB 12.5  HCT 36.0  PLT 232    Chemistries   Recent Labs Lab 07/04/15 0206 07/05/15 0436  NA 141 143  K 4.0 3.5  CL 109 117*  CO2 25 26  GLUCOSE 109* 87  BUN 18 10  CREATININE 0.99 0.83  CALCIUM 8.8* 8.0*   AST 22  --   ALT 23  --   ALKPHOS 120  --   BILITOT 0.7  --     Cardiac Enzymes No results for input(s): TROPONINI in the last 168 hours.  Microbiology Results  Results for orders placed or performed during the hospital encounter of 10/02/14  Urine culture     Status: None   Collection Time: 10/02/14 12:18 PM  Result Value Ref Range Status   Specimen Description URINE, CLEAN CATCH  Final   Special Requests Normal  Final   Culture >=100,000 COLONIES/mL ESCHERICHIA COLI  Final   Report Status 11/05/2014 FINAL  Final   Organism ID, Bacteria ESCHERICHIA COLI  Final      Susceptibility   Escherichia coli - MIC*    AMPICILLIN 4 SENSITIVE Sensitive     CEFTAZIDIME <=1 SENSITIVE Sensitive     CEFAZOLIN <=4 SENSITIVE Sensitive     CEFTRIAXONE <=1 SENSITIVE Sensitive     CIPROFLOXACIN <=0.25 SENSITIVE Sensitive     GENTAMICIN <=1 SENSITIVE Sensitive     IMIPENEM <=0.25 SENSITIVE Sensitive     TRIMETH/SULFA <=20 SENSITIVE Sensitive     CEFOXITIN <=4 SENSITIVE Sensitive     * >=100,000 COLONIES/mL ESCHERICHIA COLI    RADIOLOGY:  No results found.    Management plans discussed with the patient, family and they are in agreement.  CODE STATUS:     Code Status Orders        Start     Ordered   07/04/15 0523  Full code   Continuous     07/04/15 0522    Code Status History    Date Active Date Inactive Code Status Order ID Comments User Context   This patient has a current code status but no historical code status.    Advance Directive Documentation        Most Recent Value   Type of Advance Directive  Healthcare Power of Attorney, Living will   Pre-existing out of facility DNR order (yellow form or pink MOST form)     "MOST" Form in Place?        TOTAL TIME TAKING CARE OF THIS PATIENT: 35 minutes.    Altamese Dilling M.D on 07/06/2015 at 11:57 AM  Between 7am to 6pm - Pager - [phone]  After 6pm go to [email or web address] - password [email or web address] [facility]  [facility]  Office  [phone]  CC: Primary care physician; Nicki Reaper, NP  Note: This dictation was prepared with Dragon dictation along with smaller phrase technology. Any transcriptional errors that result from this process are unintentional.

## 2015-07-06 NOTE — Progress Notes (Signed)
50 yrs.old female admitted from the medical floor for suicidal attempt by overdosing xanax & imdur.Skin assessment & body search done.No contraband found on admission.Oriented to unit & made comfortable in room.

## 2015-07-07 ENCOUNTER — Encounter: Payer: Self-pay | Admitting: Psychiatry

## 2015-07-07 DIAGNOSIS — Z932 Ileostomy status: Secondary | ICD-10-CM

## 2015-07-07 DIAGNOSIS — K9289 Other specified diseases of the digestive system: Secondary | ICD-10-CM

## 2015-07-07 DIAGNOSIS — M797 Fibromyalgia: Secondary | ICD-10-CM

## 2015-07-07 DIAGNOSIS — F332 Major depressive disorder, recurrent severe without psychotic features: Principal | ICD-10-CM

## 2015-07-07 MED ORDER — PROMETHAZINE HCL 25 MG PO TABS
12.5000 mg | ORAL_TABLET | Freq: Three times a day (TID) | ORAL | Status: DC | PRN
  Administered 2015-07-08: 12.5 mg via ORAL
  Filled 2015-07-07 (×2): qty 1

## 2015-07-07 MED ORDER — ADEKS PO CHEW: 2.0000 | CHEWABLE_TABLET | Freq: Every morning | ORAL | Status: DC

## 2015-07-07 MED ORDER — CALCIUM CARBONATE-VITAMIN D 500-200 MG-UNIT PO TABS
1.0000 | ORAL_TABLET | Freq: Every morning | ORAL | Status: DC
  Filled 2015-07-07: qty 1

## 2015-07-07 MED ORDER — SUCRALFATE 1 GM/10ML PO SUSP
1.0000 g | Freq: Two times a day (BID) | ORAL | Status: DC
  Administered 2015-07-07 – 2015-07-08 (×2): 1 g via ORAL
  Filled 2015-07-07 (×3): qty 10

## 2015-07-07 MED ORDER — FLUOXETINE HCL 10 MG PO CAPS
10.0000 mg | ORAL_CAPSULE | Freq: Every day | ORAL | Status: DC
  Administered 2015-07-07 – 2015-07-08 (×2): 10 mg via ORAL
  Filled 2015-07-07 (×2): qty 1

## 2015-07-07 MED ORDER — PANTOPRAZOLE SODIUM 40 MG PO TBEC
40.0000 mg | DELAYED_RELEASE_TABLET | Freq: Every day | ORAL | Status: DC
  Administered 2015-07-07 – 2015-07-08 (×2): 40 mg via ORAL
  Filled 2015-07-07 (×3): qty 1

## 2015-07-07 MED ORDER — CEPHALEXIN 250 MG PO CAPS
250.0000 mg | ORAL_CAPSULE | Freq: Three times a day (TID) | ORAL | Status: DC
  Administered 2015-07-07 – 2015-07-08 (×3): 250 mg via ORAL
  Filled 2015-07-07 (×5): qty 1

## 2015-07-07 MED ORDER — DICYCLOMINE HCL 20 MG PO TABS
20.0000 mg | ORAL_TABLET | Freq: Three times a day (TID) | ORAL | Status: DC
  Administered 2015-07-07 – 2015-07-08 (×4): 20 mg via ORAL
  Filled 2015-07-07 (×4): qty 1

## 2015-07-07 NOTE — [facility] Group Notes (Signed)
[facility] LCSW Aftercare Discharge Planning Group Note   07/07/2015 2:50 PM  Participation Quality:  Patient attended group and participated appropriately introducing herself and sharing her SMART goal is to "overcome feelings that I am a burden to my husband because he is having to take care of our daughters" who patient reports are both special needs.  Patient was very tearful while sharing in group and appeared depressed and shared feelings of guilt.  Mood/Affect:  Depressed  Depression Rating:  2  Anxiety Rating:  4  Thoughts of Suicide:  No Will you contract for safety?   NA  Current AVH:  No  Plan for Discharge/Comments:  Discharge home with family with outpatient follow up  Transportation Means: husband to pick up  Supports: family support  Lulu Riding, MSW, LCSWA

## 2015-07-07 NOTE — Progress Notes (Deleted)
Patient with anxious affect, cooperative behavior with meals, meds and plan of care. Denies SI/HI at this time. Minimal interaction with peers, verbalizes needs appropriately with staff. Patient states anxiety is worse in am and she will attempt to attend therapy groups this afternoon. Requested patient fill out daily audit sheet by 1 and patient does not comply. Resting in bed. Safety maintained.

## 2015-07-07 NOTE — [facility] Group Notes (Signed)
[facility] LCSW Group Therapy  07/07/2015 4:20 PM  Type of Therapy:  Group Therapy  Participation Level:  Active  Participation Quality:  Appropriate, Attentive, Sharing and Supportive  Affect:  Appropriate  Cognitive:  Alert, Appropriate and Oriented  Insight:  Engaged  Engagement in Therapy:  Engaged and Supportive  Modes of Intervention:  Discussion, Socialization and Support  Summary of Progress/Problems: Patient will identify positive and negative emotions that they experience and/or have difficulty managing. Patient will discuss triggers and process healthy conflict resolution skills. Patient attended group on time and participated in group discussion appropriately. She expressed feelings of guilt when she does not comply with a special diet to manage her medical conditions. Patient offered support to other group members throughout group discussion.  Jenel Lucks, CSW Intern 07/07/2015, 4:20 PM  Beryl Meager, MSW, LCSWA 07/08/2015, 10:27AM

## 2015-07-07 NOTE — Progress Notes (Signed)
Pt has been adamant about getting discharged tomorrow and at times tonight. She states she does not feel safe on the unit due to issues surrounding other Pts. She believed that she was voluntary, however, Pt is IVC. She stated that she would be calling her insurance company and letting them know she does not want to be here so they will not pay for this admission.  Briefly mentioned suing the hospital. Writer spoke to husband on the phone and explained that the Pt was IVC and to ensure that Pt was safe on unit. I informed the Pt that I would make her requests known to the doctor and the doctor would evaluate situation in the morning. Writer offered to call doctor on call to get a PRN for anxiety and Pt refused. Will continue to monitor.

## 2015-07-07 NOTE — [facility] Suicide Risk Assessment (Signed)
[facility] Admission Suicide Risk Assessment   Nursing information obtained from:  Patient Demographic factors:  Caucasian Current Mental Status:  Self-harm behaviors Loss Factors:  NA Historical Factors:  Impulsivity Risk Reduction Factors:  Responsible for children under 51 years of age  Total Time spent with patient: 1 hour Principal Problem: Severe recurrent major depression without psychotic features (HCC) Diagnosis:   Patient Active Problem List   Diagnosis Date Noted  . Fibromyalgia [M79.7] 07/07/2015  . 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 Severe recurrent major depression without psychotic features (HCC) [F33.2] 07/05/2015  . Chronic pain syndrome [G89.4] 07/05/2015  . 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 Pulmonary nodule [R91.1] 09/16/2014  . 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 Interstitial cystitis [N30.10] 03/24/2011  . GERD [K21.9] 10/28/2009  . 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] 10/28/2009   Subjective Data:   Continued Clinical Symptoms:  Alcohol Use Disorder Identification Test Final Score (AUDIT): 0 The "Alcohol Use Disorders Identification Test", Guidelines for Use in Primary Care, Second Edition.  World Science writer [facility]). Score between 0-7:  no or low risk or alcohol related problems. Score between 8-15:  moderate risk of alcohol related problems. Score between 16-19:  high risk of alcohol related problems. Score 20 or above:  warrants further diagnostic evaluation for alcohol dependence and treatment.   CLINICAL FACTORS:   Depression:   Severe Chronic Pain   Psychiatric Specialty Exam: ROS                                                          COGNITIVE FEATURES THAT CONTRIBUTE TO RISK:  None    SUICIDE RISK:   Moderate:  Frequent suicidal ideation with limited intensity, and duration, some specificity in terms of plans, no associated intent, good  self-control, limited dysphoria/symptomatology, some risk factors present, and identifiable protective factors, including available and accessible social support.  PLAN OF CARE: admit to [facility]  I certify that inpatient services furnished can reasonably be expected to improve the patient's condition.   Jimmy Footman, MD 07/07/2015, 11:24 AM

## 2015-07-07 NOTE — H&P (Signed)
Psychiatric Admission Assessment Adult  Patient Identification: Judy Gibbs MRN:  [id number] Date of Evaluation:  07/07/2015 Chief Complaint:  Major Depression Principal Diagnosis: Severe recurrent major depression without psychotic features (HCC) Diagnosis:   Patient Active Problem List   Diagnosis Date Noted  . Fibromyalgia [M79.7] 07/07/2015  . 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 Severe recurrent major depression without psychotic features (HCC) [F33.2] 07/05/2015  . Chronic pain syndrome [G89.4] 07/05/2015  . 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 Pulmonary nodule [R91.1] 09/16/2014  . 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 Interstitial cystitis [N30.10] 03/24/2011  . GERD [K21.9] 10/28/2009  . 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 Patient admits to the facts of the situation. She says that she took a large number of her Imdur tablets and also a large number of Xanax and crush them up and took them with water. She admits that she wrote a suicide note. She says it was something she just started thinking about yesterday but she's been very depressed for months possibly years. She states depressed and down all the time. Feels negative and bad about herself all the time. Feels like her family would be better off without her. Sleep is poor despite multiple medicines. Appetite is currently stable. Patient has chronic pain issues in her abdomen and general pain all over which she blames for a lot of her unhappiness. She states that she has multiple stresses including her pain and the fact that both of her daughters are disabled and feeling guilty that she can't take care of them. She says she occasionally will hear a voice when she gets really anxious but not consistently. Doesn't report  other psychotic symptoms. Denies abusing drugs or alcohol. She is prescribed Xanax by her primary care doctor  [facility]  Nicki Reaper NP) but not any antidepressants and is currently not getting any psychiatric treatment.  Says she doesn't abuse any other drugs. Her drug screen was positive for amphetamines and she is not prescribed any but she absolutely denied to me having taken any amphetamines or similar medicines.   Associated Signs/Symptoms: Depression Symptoms:  depressed mood, psychomotor retardation, fatigue, suicidal attempt, loss of energy/fatigue, (Hypo) Manic Symptoms:  denies Anxiety Symptoms:  denies Psychotic Symptoms:  denies PTSD Symptoms: NA   Total Time spent with patient: 1 hour  Past Psychiatric History: Patient states she's had a psychiatric hospitalization in the past probably about 18 years ago when her youngest daughter was a child. Patient also made a suicide attempt at that time but says that it was not as serious as this one. She was prescribed medicines in the past including Prozac lamotrigine and risperidone. She was diagnosed with bipolar disorder by Dr. Tomasa Rand despite the fact that she's never had a manic episode. Hasn't been getting any psychiatric or mental health treatment in the time since.  Past Medical History:  Past Medical History  Diagnosis Date  . DDD (degenerative disc disease), cervical   . Anxiety   . Depression   . Fibromyalgia   . GERD (gastroesophageal reflux disease)   . Raynauds phenomenon   . Gastroparesis   . Colonic inertia   . Proctalgia   . Pelvic floor dysfunction   . Hypoglycemia   . Interstitial cystitis   . Obesity   . Vertigo   . POTS (  postural orthostatic tachycardia syndrome)     Past Surgical History  Procedure Laterality Date  . Cholecystectomy    . Hysterectomy - unknown type    . Total knee arthroplasty  89  & 10    R & L  . Colonoscopy  08/08/2001    normal, Dr. Lina Sar  . Upper  gastrointestinal endoscopy  06/15/2006    hiatal hernia, gastritis  . Esophagogastroduodenoscopy  12/20/1993    mild antral gastritis, Dr. Lina Sar  . Edg  06/28/1987    Dr. Matthias Hughs, bile in stomach  . Upper gastrointestinal endoscopy  07/04/2001    Dr. Lina Sar  . Diverting ileostomy  2013  . Abdominal hysterectomy  2001    has one ovary  . Ileostomy revision     Family History: Patient states an extensive family history of depression including a grandfather who committed suicide both of her parents with depression and multiple other people with depression in her family as well as mental health problems and one of her daughters Family History  Problem Relation Age of Onset  . Colon polyps Paternal Uncle   . Esophageal cancer Paternal Uncle   . Diabetes Father   . Heart disease Father   . Stroke Father   . Hyperlipidemia Father   . Hyperlipidemia Mother   . Cancer Paternal Aunt     breast  . Early death Neg Hx     Social History: Patient lives with her husband and 2 adult daughters. One of them is apparently mentally ill and the other one has abdominal problems the same as the patient and is very disabled. Patient doesn't work outside the home. Has chronic pain. She says her relationship with her husband's good except that she feels guilty about how much work he has to do. History  Alcohol Use No     History  Drug Use No     Allergies:   Allergies  Allergen Reactions  . Celecoxib Hives  . Reglan [Metoclopramide] Anxiety   Lab Results:  Results for orders placed or performed during the hospital encounter of 07/04/15 (from the past 48 hour(s))  Urine culture     Status: None (Preliminary result)   Collection Time: 07/06/15  3:12 AM  Result Value Ref Range   Specimen Description URINE, RANDOM    Special Requests NONE    Culture      >=100,000 COLONIES/mL GRAM NEGATIVE RODS IDENTIFICATION AND SUSCEPTIBILITIES TO FOLLOW    Report Status PENDING   Urinalysis complete,  with microscopic ([facility] only)     Status: Abnormal   Collection Time: 07/06/15  3:13 AM  Result Value Ref Range   Color, Urine YELLOW (A) YELLOW   APPearance CLOUDY (A) CLEAR   Glucose, UA NEGATIVE NEGATIVE mg/dL   Bilirubin Urine NEGATIVE NEGATIVE   Ketones, ur NEGATIVE NEGATIVE mg/dL   Specific Gravity, Urine 1.019 1.005 - 1.030   Hgb urine dipstick 1+ (A) NEGATIVE   pH 6.0 5.0 - 8.0   Protein, ur 100 (A) NEGATIVE mg/dL   Nitrite NEGATIVE NEGATIVE   Leukocytes, UA 3+ (A) NEGATIVE   RBC / HPF TOO NUMEROUS TO COUNT 0 - 5 RBC/hpf   WBC, UA TOO NUMEROUS TO COUNT 0 - 5 WBC/hpf   Bacteria, UA NONE SEEN NONE SEEN   Squamous Epithelial / LPF 0-5 (A) NONE SEEN   Mucous PRESENT    Ca Oxalate Crys, UA PRESENT     Metabolic Disorder Labs:  Lab Results  Component Value  Date   HGBA1C 4.6 07/04/2015   No results found for: PROLACTIN No results found for: CHOL, TRIG, HDL, CHOLHDL, VLDL, LDLCALC  Current Medications: Current Facility-Administered Medications  Medication Dose Route Frequency Provider Last Rate Last Dose  . acetaminophen (TYLENOL) tablet 650 mg  650 mg Oral Q6H PRN Audery Amel, MD      . Clementeen Hoof chewable tablet 2 tablet  2 tablet Oral q morning - 10a Jimmy Footman, MD   2 tablet at 07/07/15 1310  . alum & mag hydroxide-simeth (MAALOX/MYLANTA) 200-200-20 MG/5ML suspension 30 mL  30 mL Oral Q4H PRN Audery Amel, MD      . calcium-vitamin D (OSCAL WITH D) 500-200 MG-UNIT per tablet 1 tablet  1 tablet Oral q morning - 10a Audery Amel, MD   1 tablet at 07/07/15 1311  . cephALEXin (KEFLEX) capsule 250 mg  250 mg Oral 3 times per day Jimmy Footman, MD   250 mg at 07/07/15 1312  . dicyclomine (BENTYL) tablet 20 mg  20 mg Oral TID AC Jimmy Footman, MD   20 mg at 07/07/15 1314  . FLUoxetine (PROZAC) capsule 10 mg  10 mg Oral Daily Jimmy Footman, MD      . isosorbide dinitrate (ISORDIL) tablet 10 mg  10 mg Oral TID Audery Amel, MD    10 mg at 07/07/15 1315  . loperamide (IMODIUM) capsule 2 mg  2 mg Oral QID PRN Audery Amel, MD      . magnesium hydroxide (MILK OF MAGNESIA) suspension 30 mL  30 mL Oral Daily PRN Audery Amel, MD      . pantoprazole (PROTONIX) EC tablet 40 mg  40 mg Oral Daily Jimmy Footman, MD   40 mg at 07/07/15 1314  . promethazine (PHENERGAN) tablet 12.5 mg  12.5 mg Oral Q8H PRN Jimmy Footman, MD      . sucralfate (CARAFATE) 1 GM/10ML suspension 1 g  1 g Oral BID Jimmy Footman, MD   1 g at 07/07/15 1313  . traZODone (DESYREL) tablet 150 mg  150 mg Oral QHS Jimmy Footman, MD   150 mg at 07/06/15 2200   PTA Medications: Prescriptions prior to admission  Medication Sig Dispense Refill Last Dose  . ALPRAZolam (XANAX) 1 MG tablet TAKE 1 TABLET BY MOUTH AT BEDTIME AS NEEDED (Patient not taking: Reported on 07/04/2015) 30 tablet 0 Not Taking at Unknown time  . ALPRAZolam (XANAX) 1 MG tablet Take 1 mg by mouth 2 (two) times daily as needed for anxiety or sleep.   prn at prn  . calcium-vitamin D (OSCAL) 250-125 MG-UNIT per tablet Take 2 tablets by mouth every morning.    unknown at unknown  . cephALEXin (KEFLEX) 250 MG capsule Take 1 capsule (250 mg total) by mouth every 8 (eight) hours. 15 capsule 0   . dicyclomine (BENTYL) 20 MG tablet Take 20 mg by mouth 3 (three) times daily as needed for spasms.   prn at prn  . isosorbide dinitrate (ISORDIL) 10 MG tablet Take 10 mg by mouth 3 (three) times daily.   unknown at unknown  . loperamide (IMODIUM) 2 MG capsule Take 2 mg by mouth 4 (four) times daily as needed for diarrhea or loose stools.    prn at prn  . Multiple Vitamins-Minerals (ADEKS) chewable tablet Chew 2 tablets by mouth every morning. Multivitamin gummie   unknown at unknown  . omeprazole (PRILOSEC) 40 MG capsule Take 40 mg by mouth 2 (two) times daily.  unknown at unknown  . sucralfate (CARAFATE) 1 GM/10ML suspension Take 10 mLs (1 g total) by mouth 2 (two)  times daily. 420 mL 0   . traZODone (DESYREL) 100 MG tablet Take 1 tablet (100 mg total) by mouth at bedtime. 30 tablet 0 unknown at unknown    Musculoskeletal: Strength & Muscle Tone: within normal limits Gait & Station: normal Patient leans: N/A  Psychiatric Specialty Exam: Physical Exam  Constitutional: She is oriented to person, place, and time. She appears well-developed and well-nourished.  HENT:  Head: Normocephalic and atraumatic.  Eyes: Conjunctivae and EOM are normal.  Neck: Normal range of motion. Neck supple.  Respiratory: Effort normal.  Musculoskeletal: Normal range of motion.  Neurological: She is alert and oriented to person, place, and time.    Review of Systems  Constitutional: Negative.   HENT: Negative.   Eyes: Negative.   Respiratory: Negative.   Cardiovascular: Negative.   Gastrointestinal: Positive for abdominal pain.  Genitourinary: Positive for dysuria.  Musculoskeletal: Negative.   Skin: Negative.   Neurological: Negative.   Endo/Heme/Allergies: Negative.   Psychiatric/Behavioral: Positive for depression. Negative for suicidal ideas, hallucinations and substance abuse. The patient has insomnia. The patient is not nervous/anxious.     Blood pressure 138/91, pulse 84, temperature 98.4 F (36.9 C), temperature source Oral, resp. rate 18, height 5\' 6"  (1.676 m), weight 93.214 kg (205 lb 8 oz), SpO2 99 %.Body mass index is 33.18 kg/(m^2).  General Appearance: Fairly Groomed  Patent attorney::  Good  Speech:  Clear and Coherent  Volume:  Normal  Mood:  Dysphoric  Affect:  Appropriate  Thought Process:  Linear  Orientation:  Full (Time, Place, and Person)  Thought Content:  Hallucinations: None  Suicidal Thoughts:  No  Homicidal Thoughts:  No  Memory:  Immediate;   Good Recent;   Good Remote;   Good  Judgement:  Fair  Insight:  Fair  Psychomotor Activity:  Decreased  Concentration:  Good  Recall:  Good  Fund of Knowledge:Good  Language: Good   Akathisia:  No  Handed:    AIMS (if indicated):     Assets:  Architect Housing Social Support  ADL's:  Intact  Cognition: WNL  Sleep:  Number of Hours: 4.45     Treatment Plan Summary: Daily contact with patient to assess and evaluate symptoms and progress in treatment and Medication management    MDD: Patient states she had a prior trial with fluoxetine that worked very well for her. She would like to retry this medication. I will start her on fluoxetine 10 mg by mouth daily  Insomnia: Patient has been is started on trazodone 150 mg by mouth daily at bedtime  GI dysmotility: She has ileostomy in place.  Continue isosorbide daily use as it has antispasmodic properties  GERD: Continue pantoprazole 40 mg a day and sucralfate  Diarrhea chronic continue Imodium when necessary  Chronic abdominal pain: continue  Bentyl when necessary  Vitamin D deficiency/osteopenia: Continue vitamin D with calcium daily  Malabsorption: Continue multivitamins daily  UTI: continue keflex tid for 7 days  Nausea: will order phenergan prn  Diet  Precautions  Dispo: will return home with family.  F/u will need psychiatric f/u  Will f/u with: -[facility]  Nicki Reaper NP---primary care -GI at [facility] with [facility]   I certify that inpatient services furnished can reasonably be expected to improve the patient's condition.    Jimmy Footman, MD 2/15/20173:15 PM

## 2015-07-07 NOTE — Progress Notes (Signed)
Recreation Therapy Notes  INPATIENT RECREATION THERAPY ASSESSMENT  Patient Details Name: Judy Gibbs MRN: [id number] DOB: 03-23-1966 Today's Date: 07/07/2015  Patient Stressors: Other (Comment) (Can't care for family like she used to - physically is unable)  Coping Skills:   Avoidance, Exercise, Art/Dance, Talking, Music, Sports, Other (Comment) (Pray, read the Bible, deep breathing, muscle relaxation)  Personal Challenges: Communication, Decision-Making, Relationships, Self-Esteem/Confidence, Social Interaction, Stress Management, Trusting Others  Leisure Interests (2+):  Music - Listen, Individual - Other (Comment) (Joke around with family)  Awareness of Community Resources:  Yes  Community Resources:  [place], [place]  Current Use: Yes  If no, Barriers?:    Patient Strengths:  Secretary/administrator, creative  Patient Identified Areas of Improvement:  Physical health, stay within limitation of diet  Current Recreation Participation:  Joke around with family, play with dogs, needle point, drawing, singing  Patient Goal for Hospitalization:  To come to grips with her illness and understanding she is not dead and may not die from illness - learn to be content  [place] of Residence:  [facility] of Residence:  [place]   Current SI (including self-harm):  No  Current HI:  No  Consent to Intern Participation: N/A   Jacquelynn Cree, LRT/CTRS 07/07/2015, 1:50 PM

## 2015-07-07 NOTE — Plan of Care (Signed)
Problem: Ineffective individual coping Goal: LTG: Patient will report a decrease in negative feelings Outcome: Not Progressing Patient remains depressed, anxious and with poor insight and coping skills.

## 2015-07-07 NOTE — Progress Notes (Signed)
Patient with appropriate affect, cooperative behavior with meals, meds and plan of care. No SI/HI at this time. Social with peers, verbalizes needs appropriately with staff. Therapy groups encouraged and safety maintained.

## 2015-07-07 NOTE — [facility] Group Notes (Signed)
[facility] Group Notes:  (Nursing/MHT/Case Management/Adjunct)  Date:  07/07/2015  Time:  1:42 PM  Type of Therapy:  Group Therapy  Participation Level:  Did Not Attend  Participation Quality: Summary of Progress/Problems:  Judy Gibbs 07/07/2015, 1:42 PM

## 2015-07-07 NOTE — Progress Notes (Signed)
Recreation Therapy Notes  Date: 02.15.17 Time: 3:00 pm Location: Community Room  Group Topic: Self-esteem  Goal Area(s) Addresses:  Patient will write down at least one positive trait. Patient will verbalize benefit of having a healthy self-esteem.  Behavioral Response: Attentive, Interactive  Intervention: I Am  Activity: Patients were given a worksheet with the letter I and instructed to write as many positive traits about themselves inside the letter.  Education: LRT educated patients on ways they can increase self-esteem.  Education Outcome: Acknowledges education/In group clarification offered  Clinical Observations/Feedback: Patient completed activity by writing down positive traits. Patient contributed to group discussion by stating how it felt to write positive traits.  Jacquelynn Cree, LRT/CTRS 07/07/2015 4:17 PM

## 2015-07-07 NOTE — Progress Notes (Signed)
Pt did not get much sleep last night, but pt did not take Trazodone .

## 2015-07-08 LAB — URINE CULTURE

## 2015-07-08 MED ORDER — CEPHALEXIN 250 MG PO CAPS: 250.0000 mg | ORAL_CAPSULE | Freq: Three times a day (TID) | ORAL | Status: DC

## 2015-07-08 MED ORDER — CLONAZEPAM 0.5 MG PO TABS: 0.5000 mg | ORAL_TABLET | Freq: Two times a day (BID) | ORAL | Status: DC | PRN

## 2015-07-08 MED ORDER — TRAZODONE HCL 150 MG PO TABS: 150.0000 mg | ORAL_TABLET | Freq: Every day | ORAL | Status: DC

## 2015-07-08 MED ORDER — FLUOXETINE HCL 10 MG PO CAPS: 10.0000 mg | ORAL_CAPSULE | Freq: Every day | ORAL | Status: DC

## 2015-07-08 NOTE — Progress Notes (Signed)
Recreation Therapy Notes  Date: 02.16.17 Time: 2:00 pm Location: Community Room  Group Topic: Coping Skills/Leisure Education  Goal Area(s) Addresses:  Patient will identify things they are grateful for. Patient will identify how being grateful can influence decision making.  Behavioral Response: Attentive, Interactive, Left early  Intervention: Grateful Wheel  Activity: Patients were given an "I Am Grateful For" worksheet and instructed to write 2-3 things they were grateful for under each category.  Education: LRT educated patients on why it is important to think of things they are grateful for.  Education Outcome: Patient left before LRT educated group.  Clinical Observations/Feedback: Patient completed activity by writing at least one item in each category. Patient contributed to group discussion by stating things she was grateful for. Patient left group at approximately 2:35 pm. Patient did not return to group.  Jacquelynn Cree, LRT/CTRS 07/08/2015 3:06 PM

## 2015-07-08 NOTE — Progress Notes (Signed)
  [facility] Adult Case Management Discharge Plan :  Will you be returning to the same living situation after discharge:  Yes,  home At discharge, do you have transportation home?: Yes,  husband Do you have the ability to pay for your medications: Yes,  insurance, income   Release of information consent forms completed and in the chart;  Patient's signature needed at discharge.  Patient to Follow up at: Follow-up Information    Go to to follow up.   Why:  discharge follow up Feb 17 at 10:30 am      Follow up with [facility]. Schedule an appointment as soon as possible for a visit today.   Why:  Please call to schedule appointment upon discharge.   Contact information:   [address] [place], [place] [address] Phone: [phone] Fax: [phone]      Next level of care provider has access to [facility] Link:no  Safety Planning and Suicide Prevention discussed: Yes,  with husband   Have you used any form of tobacco in the last 30 days? (Cigarettes, Smokeless Tobacco, Cigars, and/or Pipes): No  Has patient been referred to the Quitline?: N/A patient is not a smoker  Patient has been referred for addiction treatment: N/A  Rondall Allegra  MSW, LCSWA   07/08/2015, 12:25 PM

## 2015-07-08 NOTE — Progress Notes (Signed)
Patient discharged at this time to husband via private car. Acknowledges all belongings and meds from home have been returned. Verbalizes understanding rt recommended discharge plan of care. Safety maintained.

## 2015-07-08 NOTE — Progress Notes (Signed)
Patient with appropriate affect and cooperative behavior with meals, meds and plan of care. No SI/HI at this time. Actively participating in plan of care. Patient acknowledges her stressors and supports. MD and LCSW into visit patient and to discharge today when discharge today when discharge plan and transportation in place. Safety maintained.

## 2015-07-08 NOTE — [facility] Group Notes (Signed)
[facility] Group Notes:  (Nursing/MHT/Case Management/Adjunct)  Date:  07/08/2015  Time:  2:39 PM  Type of Therapy:  Group Therapy  Participation Level:  Active  Participation Quality:  Appropriate, Attentive and Supportive  Affect:  Appropriate  Cognitive:  Alert, Appropriate and Oriented  Insight:  Appropriate  Engagement in Group:  Engaged  Modes of Intervention:  Activity  Summary of Progress/Problems:  Judy Gibbs 07/08/2015, 2:39 PM 

## 2015-07-08 NOTE — Plan of Care (Signed)
Problem: [facility] Participation in Recreation Therapeutic Interventions Goal: STG-Patient will demonstrate improved self esteem by identif STG: Self-Esteem - Within 4 treatment sessions, patient will verbalize at least 5 positive affirmation statements in each of 2 treatment sessions to increase self-esteem post d/c.  Outcome: Progressing Treatment Session 1; Completed 1 out of 2: At approximately 9:25 am, LRT met with patient in patient room. Patient verbalized 5 positive affirmation statements. Patient reported it felt "uncomfortable". LRT encouraged patient to continue saying positive affirmation statements. Intervention Used: I Am statements  Leonette Monarch, LRT/CTRS 02.16.17 11:01 am Goal: STG-Other Recreation Therapy Goal (Specify) STG: Stress Management - Within 4 treatment sessions, patient will verbalize understanding of the stress management techniques in each of 2 treatment sessions to increase stress management skills post d/c.  Outcome: Progressing Treatment Session 1; Completed 1 out of 2: At approximately 9:25 am, LRT met with patient in patient room. LRT educated and provided patient with handouts on stress management techniques. Patient verbalized understanding. LRT encouraged patient to read over and practice the stress management techniques. Intervention Used: Stress Management handouts  Leonette Monarch, LRT/CTRS 02.16.17 11:02 pm
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
## 2015-07-08 NOTE — [facility] Suicide Risk Assessment (Signed)
[facility] INPATIENT:  Family/Significant Other Suicide Prevention Education  Suicide Prevention Education:  Education Completed; Judy Gibbs [phone] has been identified by the patient as the family member/significant other with whom the patient will be residing, and identified as the person(s) who will aid the patient in the event of a mental health crisis (suicidal ideations/suicide attempt).  With written consent from the patient, the family member/significant other has been provided the following suicide prevention education, prior to the and/or following the discharge of the patient.  The suicide prevention education provided includes the following:  Suicide risk factors  Suicide prevention and interventions  National Suicide Hotline telephone number  [facility] assessment telephone number  [facility] Emergency Assistance 911  [facility] and/or Residential Mobile Crisis Unit telephone number  Request made of family/significant other to:  Remove weapons (e.g., guns, rifles, knives), all items previously/currently identified as safety concern.    Remove drugs/medications (over-the-counter, prescriptions, illicit drugs), all items previously/currently identified as a safety concern.  The family member/significant other verbalizes understanding of the suicide prevention education information provided.  The family member/significant other agrees to remove the items of safety concern listed above.  Jenel Lucks, CSW Intern 07/08/2015, 12:29 PM
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
## 2015-07-08 NOTE — Tx Team (Signed)
Interdisciplinary Treatment Plan Update (Adult)  Date:  07/08/2015 Time Reviewed:  12:26 PM  Progress in Treatment: Attending groups: Yes. Participating in groups:  Yes. Taking medication as prescribed:  Yes. Tolerating medication:  Yes. Family/Significant othe contact made:  Yes, individual(s) contacted:  husband  Patient understands diagnosis:  Yes. Discussing patient identified problems/goals with staff:  Yes. Medical problems stabilized or resolved:  Yes. Denies suicidal/homicidal ideation: Yes. Issues/concerns per patient self-inventory:  Yes. Other:  New problem(s) identified: No, Describe:  NA  Discharge Plan or Barriers: Pt plans to return home and follow up with outpatient.    Reason for Continuation of Hospitalization: Depression Medication stabilization Suicidal ideation  Comments:50 year old woman brought to the emergency room 0N 2/12 after her family found her having taken an overdose. Patient admits to the facts of the situation. She says that she took a large number of her Imdur tablets and also a large number of Xanax and crush them up and took them with water. She admits that she wrote a suicide note. She says it was something she just started thinking about yesterday but she's been very depressed for months possibly years. She states depressed and down all the time. Feels negative and bad about herself all the time. Feels like her family would be better off without her. Sleep is poor despite multiple medicines. Appetite is currently stable. Patient has chronic pain issues in her abdomen and general pain all over which she blames for a lot of her unhappiness. She states that she has multiple stresses including her pain and the fact that both of her daughters are disabled and feeling guilty that she can't take care of them. She says she occasionally will hear a voice when she gets really anxious but not consistently. Doesn't report other psychotic symptoms. Denies abusing  drugs or alcohol. She is prescribed Xanax by her primary care doctor ([place] NP) but not any antidepressants and is currently not getting any psychiatric treatment.  Estimated length of stay:  Pt d/c today.   New goal(s): NA  Review of initial/current patient goals per problem list:  1.  Goal(s): Patient will participate in aftercare plan * Met: 07/08/2015  * Target date: at discharge * As evidenced by: Patient will participate within aftercare plan AEB aftercare provider and housing plan at discharge being identified.   2.  Goal (s): Patient will exhibit decreased depressive symptoms and suicidal ideations. * Met: 07/08/2015  *  Target date: at discharge * As evidenced by: Patient will utilize self rating of depression at 3 or below and demonstrate decreased signs of depression or be deemed stable for discharge by MD.   3.  Goal(s): Patient will demonstrate decreased signs and symptoms of anxiety. * Met: 07/08/2015  * Target date: at discharge * As evidenced by: Patient will utilize self rating of anxiety at 3 or below and demonstrated decreased signs of anxiety, or be deemed stable for discharge by MD  Attendees: Patient:  Judy Gibbs 2/16/201712:26 PM  Family:   2/16/201712:26 PM  Physician:  Dr. Jerilee Hoh   2/16/201712:26 PM  Nursing:  Meredith Mody, RN  2/16/201712:26 PM  Case Manager:   2/16/201712:26 PM  Counselor:   2/16/201712:26 PM  Other:  Wray Kearns, [place] 2/16/201712:26 PM  Other:  Everitt Amber, [place]  2/16/201712:26 PM  Other:  2/16/201712:26 PM  Other:  2/16/201712:26 PM  Other:  2/16/201712:26 PM  Other:  2/16/201712:26 PM  Other:  2/16/201712:26 PM  Other:  2/16/201712:26 PM  Other:  2/16/201712:26 PM  Other:   2/16/201712:26 PM   Scribe for Treatment Team:   Campbell Stall Brycin Kille,MSW, [place]  07/08/2015, 12:26 PM

## 2015-07-08 NOTE — Progress Notes (Signed)
Recreation Therapy Notes  INPATIENT RECREATION TR PLAN  Patient Details Name: Judy Gibbs MRN: [id number] DOB: November 23, 1965 Today's Date: 07/08/2015  Rec Therapy Plan Is patient appropriate for Therapeutic Recreation?: Yes Treatment times per week: At least once a week TR Treatment/Interventions: 1:1 session, Group participation (Comment) (Appropriate participation in daily recreation therapy tx)  Discharge Criteria Pt will be discharged from therapy if:: Treatment goals are met, Discharged Treatment plan/goals/alternatives discussed and agreed upon by:: Patient/family  Discharge Summary Short term goals set: See Care Plan Short term goals met: Adequate for discharge Progress toward goals comments: One-to-one attended Which groups?: Leisure education, Coping skills, Self-esteem One-to-one attended: Self-esteem, stress management Reason goals not met: N/A Therapeutic equipment acquired: None Reason patient discharged from therapy: Discharge from hospital Pt/family agrees with progress & goals achieved: Yes Date patient discharged from therapy: 07/08/15   Leonette Monarch, LRT/CTRS 07/08/2015, 3:08 PM

## 2015-07-08 NOTE — Progress Notes (Signed)
D: Pt anxious and fearful at start of shift. Worried about safety. Wanting to make sure RN understands not to dismiss requests to be discharged tomorrow. Isolated to room and hallway near nurses station and didn't go out for snack due to being fearful of other Pts. Pts husband called nurses station three times inquiring about Pts safety and possibility of discharge. Pt and husband were stating that she was voluntary and had to be informed that she was IVC. Denies pain. Denies SI/AVH A: Encouragement, support and reassurance of safety provided. Medications given as prescribed. Q15 minute checks maintained for safety. Snack brought to Pts room due to Pt being fearful and in tears and at her request. R: Pt was noted to be more calm later in the evening after speaking to her husband and was med compliant. Remains safe on unit will continue to monitor.

## 2015-07-26 DIAGNOSIS — H905 Unspecified sensorineural hearing loss: Secondary | ICD-10-CM | POA: Insufficient documentation

## 2015-07-26 DIAGNOSIS — H903 Sensorineural hearing loss, bilateral: Secondary | ICD-10-CM | POA: Insufficient documentation

## 2015-08-11 ENCOUNTER — Ambulatory Visit
Admission: EM | Admit: 2015-08-11 | Discharge: 2015-08-11 | Disposition: A | Discharge status: Home / Self Care | Payer: Commercial Managed Care - HMO | Attending: Emergency Medicine | Admitting: Emergency Medicine

## 2015-08-11 DIAGNOSIS — R300 Dysuria: Secondary | ICD-10-CM

## 2015-08-11 LAB — URINALYSIS COMPLETE WITH MICROSCOPIC (ARMC ONLY)
Bacteria, UA: NONE SEEN
Bilirubin Urine: NEGATIVE
GLUCOSE, UA: NEGATIVE mg/dL
Ketones, ur: NEGATIVE mg/dL
NITRITE: NEGATIVE
Protein, ur: NEGATIVE mg/dL
pH: 5.5 (ref 5.0–8.0)

## 2015-08-11 MED ORDER — KETOROLAC TROMETHAMINE 60 MG/2ML IM SOLN
60.0000 mg | Freq: Once | INTRAMUSCULAR | Status: AC
  Administered 2015-08-11: 60 mg via INTRAMUSCULAR

## 2015-08-11 NOTE — Discharge Instructions (Signed)
Rest. Drink plenty of fluids. Monitor symptoms closely.   Follow up with your primary care physician this week for close follow up. Return to Urgent care or ER for pain, fevers, urinary changes,  new or worsening concerns.    Dysuria Dysuria is pain or discomfort while urinating. The pain or discomfort may be felt in the tube that carries urine out of the bladder (urethra) or in the surrounding tissue of the genitals. The pain may also be felt in the groin area, lower abdomen, and lower back. You may have to urinate frequently or have the sudden feeling that you have to urinate (urgency). Dysuria can affect both men and women, but is more common in women. Dysuria can be caused by many different things, including:  Urinary tract infection in women.  Infection of the kidney or bladder.  Kidney stones or bladder stones.  Certain sexually transmitted infections (STIs), such as chlamydia.  Dehydration.  Inflammation of the vagina.  Use of certain medicines.  Use of certain soaps or scented products that cause irritation. HOME CARE INSTRUCTIONS Watch your dysuria for any changes. The following actions may help to reduce any discomfort you are feeling:  Drink enough fluid to keep your urine clear or pale yellow.  Empty your bladder often. Avoid holding urine for long periods of time.  After a bowel movement or urination, women should cleanse from front to back, using each tissue only once.  Empty your bladder after sexual intercourse.  Take medicines only as directed by your health care provider.  If you were prescribed an antibiotic medicine, finish it all even if you start to feel better.  Avoid caffeine, tea, and alcohol. They can irritate the bladder and make dysuria worse. In men, alcohol may irritate the prostate.  Keep all follow-up visits as directed by your health care provider. This is important.  If you had any tests done to find the cause of dysuria, it is your  responsibility to obtain your test results. Ask the lab or department performing the test when and how you will get your results. Talk with your health care provider if you have any questions about your results. SEEK MEDICAL CARE IF:  You develop pain in your back or sides.  You have a fever.  You have nausea or vomiting.  You have blood in your urine.  You are not urinating as often as you usually do. SEEK IMMEDIATE MEDICAL CARE IF:  You pain is severe and not relieved with medicines.  You are unable to hold down any fluids.  You or someone else notices a change in your mental function.  You have a rapid heartbeat at rest.  You have shaking or chills.  You feel extremely weak.   This information is not intended to replace advice given to you by your health care provider. Make sure you discuss any questions you have with your health care provider.   Document Released: 02/04/2004 Document Revised: 05/29/2014 Document Reviewed: 01/01/2014 Elsevier Interactive Patient Education [employer]2016 Elsevier Inc.

## 2015-08-11 NOTE — ED Notes (Signed)
Patient c/o urinary urgency and pain, lower back pain which started yesterday.  She had trouble urinating yesterday so she started drinking water and now she has the urge to urinate as soon as she is finished voiding.  She has a history of bladder infection, which she has been seen here for in the past.

## 2015-08-11 NOTE — ED Provider Notes (Signed)
[facility]  ____________________________________________  Time seen: Approximately 10:13 AM  I have reviewed the triage vital signs and the nursing notes.   HISTORY  Chief Complaint Urinary Urgency; Urinary Frequency; and Dysuria   HPI Judy Gibbs is a 50 y.o. female presents for the complaints of urinary frequency and urinary urgency since yesterday. Patient reports that she also does have some accompanying low back pain. Patient reports that she has a history of similar in the past with urinary tract infections as well as patient reports that she has an autonomic dysfunction in which she can occasionally have dysuria without infection. patient reports she has followed with urology in the past for the same. Patient states that her urologist has told her in the past that the autonomic dysfunction would normally be triggered by dehydration. Patient states that she knows she has not been drinking as much fluids lately as she should have. Patient states that since urinary frequency started yesterday afternoon she has been drinking much more water. Patient states that she is tolerating oral fluids at home well and drink plenty of water. Patient presents that she does feel better now than she did yesterday.  Patient reports that she has urinary frequency, urinary urgency and occasional burning with urination. Also reports occasional low back pain. Denies current back pain. Patient reports that she does have chronic abdominal pain. States that her chronic abdominal pain is unchanged from her baseline. Denies any vaginal discharge, vaginal bleeding or vaginal complaints. Reports has continued to eat and drink well.   Denies chest pain or shortness breath, dizziness, rash, extremity pain, extremity swelling, fevers, nausea, vomiting, diarrhea, recent sickness. Patient states that her last urinary tract infection was just over a month ago. Patient reports that she was recently hospitalized  for suicidal attempt, denies current suicidal ideation.  No LMP recorded. Patient has had a hysterectomy.  PCP: Maryagnes Amos    Past Medical History  Diagnosis Date  . DDD (degenerative disc disease), cervical   . Anxiety   . Depression   . Fibromyalgia   . GERD (gastroesophageal reflux disease)   . Raynauds phenomenon   . Gastroparesis   . Colonic inertia   . Proctalgia   . Pelvic floor dysfunction   . Hypoglycemia   . Interstitial cystitis   . Obesity   . Vertigo   . POTS (postural orthostatic tachycardia syndrome)     Patient Active Problem List   Diagnosis Date Noted  . Fibromyalgia 07/07/2015  . Ileostomy in place [facility]) 07/07/2015  . Gastrointestinal dysmotility 07/07/2015  . 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 Chronic pain syndrome 07/05/2015  . 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 Pulmonary nodule 09/16/2014  . Dysautonomia 09/01/2011  . Interstitial cystitis 03/24/2011  . GERD 10/28/2009  . Colonic inertia 10/28/2009    Past Surgical History  Procedure Laterality Date  . Cholecystectomy    . Hysterectomy - unknown type    . Total knee arthroplasty  88  & 9    R & L  . Colonoscopy  08/08/2001    normal, Dr. Lina Sar  . Upper gastrointestinal endoscopy  06/15/2006    hiatal hernia, gastritis  . Esophagogastroduodenoscopy  12/20/1993    mild antral gastritis, Dr. Lina Sar  . Edg  06/28/1987    Dr. Matthias Hughs, bile in stomach  . Upper gastrointestinal endoscopy  07/04/2001    Dr. Lina Sar  . Diverting ileostomy  2013  . Abdominal hysterectomy  2001  has one ovary  . Ileostomy revision      Current Outpatient Rx  Name  Route  Sig  Dispense  Refill  . Ibuprofen (ADVIL MIGRAINE PO)   Oral   Take by mouth.         . promethazine (PHENERGAN) 6.25 MG/5ML syrup   Oral   Take by mouth every 6 (six) hours as needed for nausea or vomiting.         . calcium-vitamin D (OSCAL) 250-125 MG-UNIT per tablet    Oral   Take 2 tablets by mouth every morning.          .           . clonazePAM (KLONOPIN) 0.5 MG tablet   Oral   Take 1 tablet (0.5 mg total) by mouth 2 (two) times daily as needed for anxiety. Patient taking differently: Take 0.125 mg by mouth 2 (two) times daily as needed for anxiety.    30 tablet   0   . dicyclomine (BENTYL) 20 MG tablet   Oral   Take 20 mg by mouth 3 (three) times daily as needed for spasms.         Marland Kitchen FLUoxetine (PROZAC) 10 MG capsule   Oral   Take 1 capsule (10 mg total) by mouth daily.   30 capsule   0   . isosorbide dinitrate (ISORDIL) 10 MG tablet   Oral   Take 10 mg by mouth 3 (three) times daily.         Marland Kitchen loperamide (IMODIUM) 2 MG capsule   Oral   Take 2 mg by mouth 4 (four) times daily as needed for diarrhea or loose stools.          . Multiple Vitamins-Minerals (ADEKS) chewable tablet   Oral   Chew 2 tablets by mouth every morning. Multivitamin gummie         . omeprazole (PRILOSEC) 40 MG capsule   Oral   Take 40 mg by mouth 2 (two) times daily.         . sucralfate (CARAFATE) 1 GM/10ML suspension   Oral   Take 10 mLs (1 g total) by mouth 2 (two) times daily.   420 mL   0   . traZODone (DESYREL) 150 MG tablet   Oral   Take 1 tablet (150 mg total) by mouth at bedtime.   30 tablet   0     Allergies Bentyl; Celecoxib; Prozac; and Reglan  Family History  Problem Relation Age of Onset  . Colon polyps Paternal Uncle   . Esophageal cancer Paternal Uncle   . Diabetes Father   . Heart disease Father   . Stroke Father   . Hyperlipidemia Father   . Hyperlipidemia Mother   . Cancer Paternal Aunt     breast  . Early death Neg Hx     Social History Social History  Substance Use Topics  . Smoking status: Former Smoker    Quit date: 05/22/1982  . Smokeless tobacco: Never Used  . Alcohol Use: No    Review of Systems Constitutional: No fever/chills Eyes: No visual changes. ENT: No sore throat. Cardiovascular:  Denies chest pain. Respiratory: Denies shortness of breath. Gastrointestinal: No abdominal pain.  No nausea, no vomiting.  No diarrhea.  No constipation. Genitourinary: positive for dysuria. Musculoskeletal: Negative for back pain. Skin: Negative for rash. Neurological: Negative for headaches, focal weakness or numbness.  10-point ROS otherwise negative.  ____________________________________________   PHYSICAL EXAM:  VITAL SIGNS: ED Triage Vitals  Enc Vitals Group     BP 08/11/15 0919 128/63 mmHg     Pulse Rate 08/11/15 0919 80     Resp 08/11/15 0919 18     Temp 08/11/15 0919 97.6 F (36.4 C)     Temp Source 08/11/15 0919 Oral     SpO2 08/11/15 0919 100 %     Weight 08/11/15 0919 208 lb (94.348 kg)     Height 08/11/15 0919 5\' 6"  (1.676 m)     Head Cir --      Peak Flow --      Pain Score 08/11/15 0927 8     Pain Loc --      Pain Edu? --      Excl. in GC? --     Constitutional: Alert and oriented. Well appearing and in no acute distress. Eyes: Conjunctivae are normal. PERRL. EOMI. Head: Atraumatic.  Nose: No congestion/rhinnorhea.  Mouth/Throat: Mucous membranes are moist.  Oropharynx non-erythematous. Neck: No stridor.  No cervical spine tenderness to palpation. Hematological/Lymphatic/Immunilogical: No cervical lymphadenopathy. Cardiovascular: Normal rate, regular rhythm. Grossly normal heart sounds.  Good peripheral circulation. Respiratory: Normal respiratory effort.  No retractions. Lungs CTAB. Gastrointestinal: Mild generalized tenderness, no point focal area of tenderness. Abdomen soft. Non-guarding. Right lower ileostomy. Normal Bowel sounds. No CVA tenderness. Musculoskeletal: No lower or upper extremity tenderness nor edema.  No cervical, thoracic or lumbar tenderness to palpation. Bilateral pedal pulses equal and easily palpated.  Neurologic:  Normal speech and language. No gross focal neurologic deficits are appreciated. No gait instability. Skin:  Skin is  warm, dry and intact. No rash noted. Psychiatric: Mood and affect are normal. Speech and behavior are normal.  ____________________________________________   LABS (all labs ordered are listed, but only abnormal results are displayed)  Labs Reviewed  URINALYSIS COMPLETEWITH MICROSCOPIC ([facility] ONLY) - Abnormal; Notable for the following:    Color, Urine STRAW (*)    Specific Gravity, Urine <1.005 (*)    Hgb urine dipstick TRACE (*)    Leukocytes, UA 2+ (*)    Squamous Epithelial / LPF 0-5 (*)    All other components within normal limits  URINE CULTURE     INITIAL IMPRESSION / ASSESSMENT AND PLAN / ED COURSE  Pertinent labs & imaging results that were available during my care of the patient were reviewed by me and considered in my medical decision making (see chart for details).   Overall very well-appearing patient. Ambulatory in room. Patient drinking fluids actively in room. Presents for the complaints of 1 day of urinary frequency, urinary urgency and some occasional low back pain. Denies current low back pain. No CVA tenderness on exam. Patient with generalized mild abdominal tenderness but reports this is same as her baseline as her chronic abdominal pain. Lungs clear throughout. Afebrile. Vital signs stable. Patient reports that she does feel better today than she did yesterday. Patient reports that she has taken IM Toradol in the past which helped with her discomforts. Denies history of stones. Recent UTI 1 month ago. Negative initially but culture was positive for Escherichia coli. Urinalysis reviewed. Urinalysis negative for bacteria, 6-30 WBCs, 0-5 RBCs, 2+ leukocytes, trace hemoglobin and sharp coloration. Will culture urinalysis. Discussed with patient as negative bacteria we'll await culture prior to initiating treatment. Patient states that she does not want to be on any antibiotics unless she absolutely has to be. Encourage patient to continue to drink plenty of fluids, rest,  close PCP follow-up. Also  discussed with patient to follow-up with urology. Strict return precautions.   ed follow up with Primary care physician this week. Discussed follow up and return parameteto urgent care or ER including fevers, increased pain, continued dysuria, flank pain, inability to urinate or bowel changes, no resolution or any worsening concerns. Patient verbalized understanding and agreed to plan.   ____________________________________________   FINAL CLINICAL IMPRESSION(S) / ED DIAGNOSES  Final diagnoses:  Dysuria      Note: This dictation was prepared with Dragon dictation along with smaller phrase technology. Any transcriptional errors that result from this process are unintentional.     Renford Dills, NP 08/11/15 1221

## 2015-08-13 LAB — URINE CULTURE: Culture: 10000

## 2016-01-28 DIAGNOSIS — D508 Other iron deficiency anemias: Secondary | ICD-10-CM | POA: Insufficient documentation

## 2016-03-19 IMAGING — CR DG CHEST 2V
2 series · 2 of 2 positions shown · non-contrast
Comparison: Chest radiograph 04/09/2014

CLINICAL DATA: Patient with history of pneumonia, febrile.

EXAM:
CHEST  2 VIEW

[chest pa]
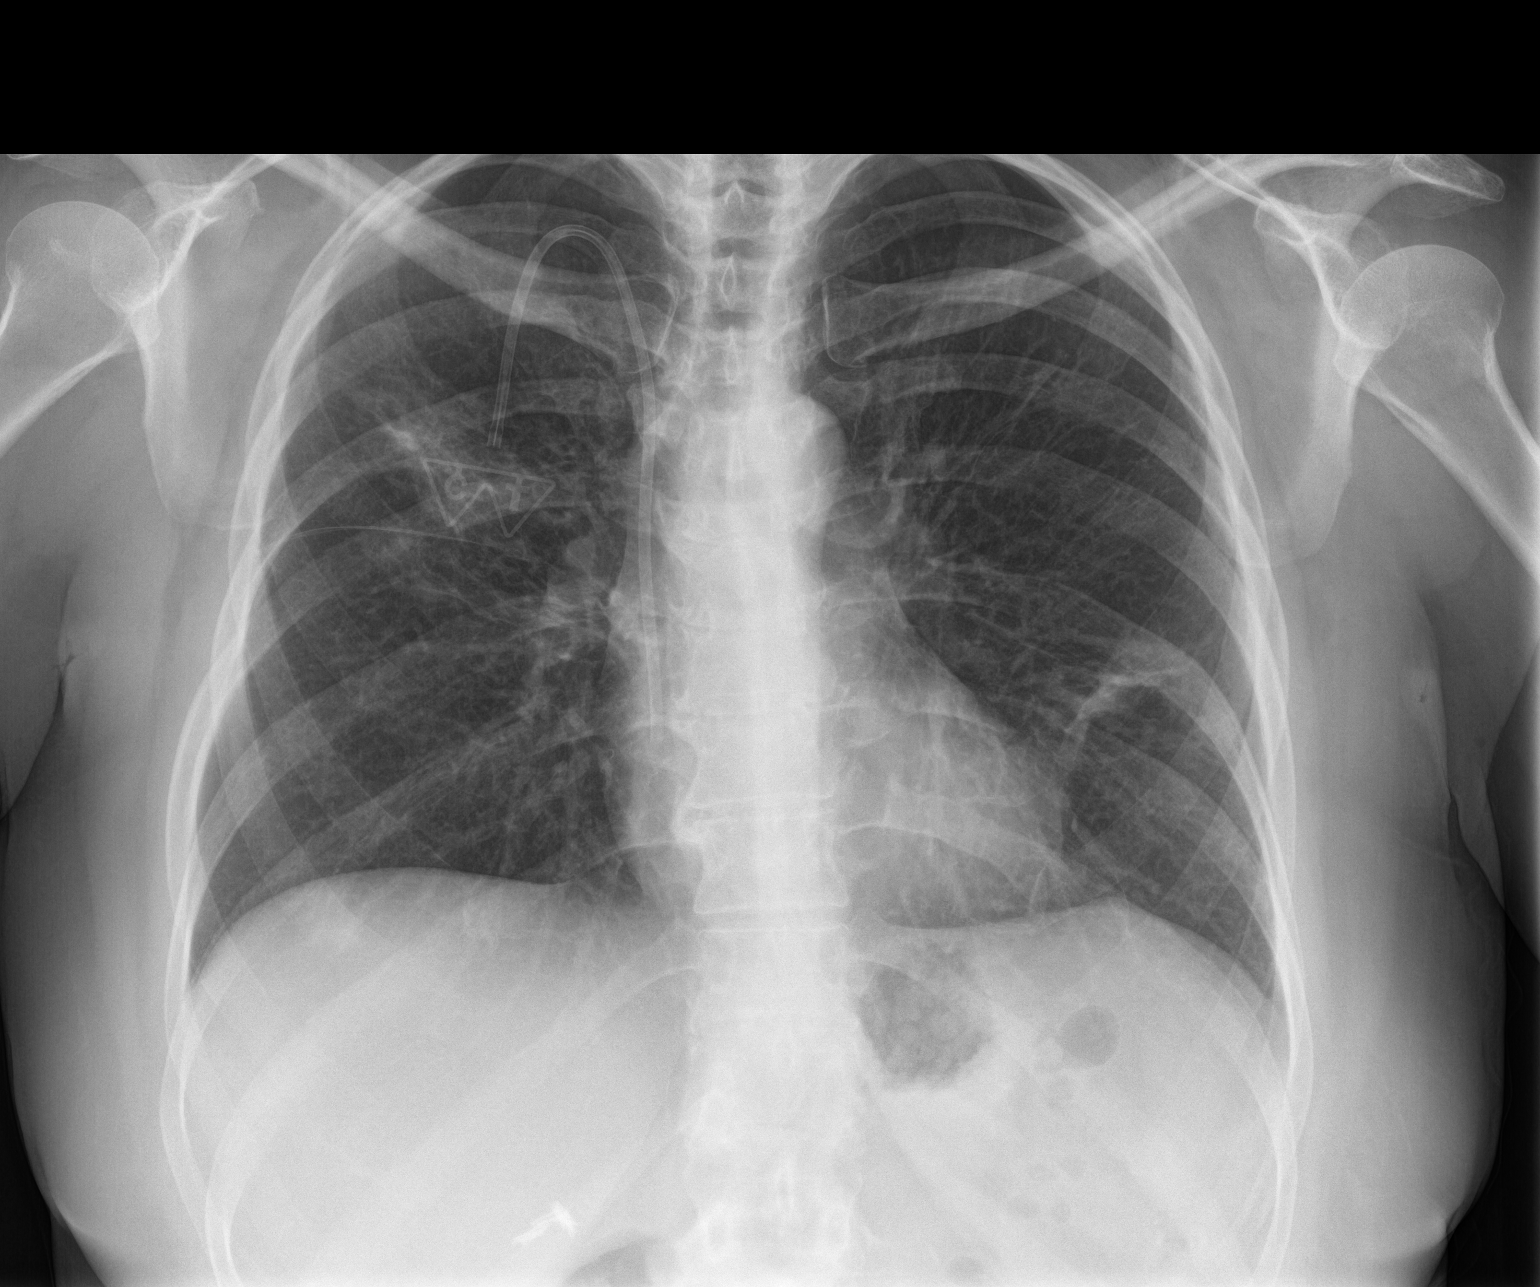

[chest lat]
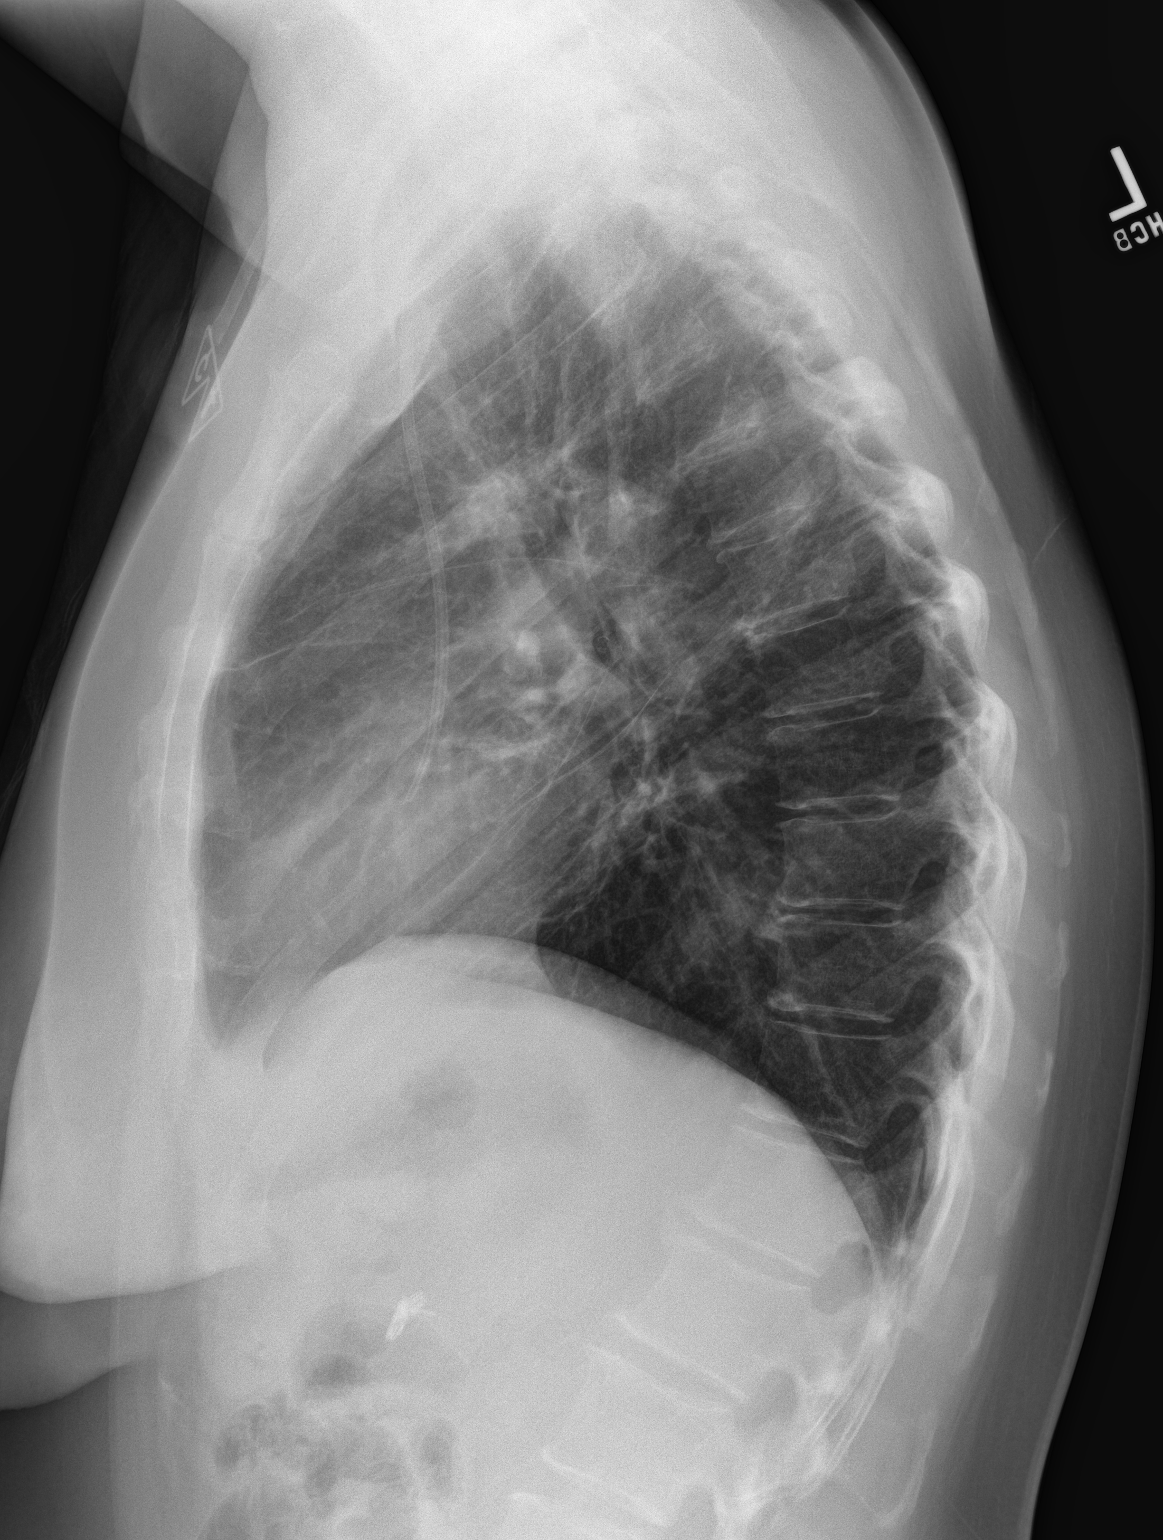

[2 of 2 positions shown; findings below may reference images not displayed]

FINDINGS: Right chest wall Port-A-Cath is present with tip projecting over the
superior cavoatrial junction. Significant interval improvement in
lingular opacity with minimal residual bandlike consolidation. No
significant interval change in heterogeneous opacities right upper
lung. No pleural effusion or pneumothorax. Cholecystectomy clips.
Thoracic spine degenerative change.
IMPRESSION: Findings compatible with improving pneumonia within the lingula.

Persistent infiltrate within the right upper lung.

Continued radiographic follow-up to resolution is recommended.

## 2016-07-04 ENCOUNTER — Ambulatory Visit: Payer: Self-pay | Admitting: Internal Medicine

## 2016-09-20 ENCOUNTER — Encounter: Payer: Self-pay | Admitting: Internal Medicine

## 2016-09-20 ENCOUNTER — Other Ambulatory Visit: Payer: Self-pay | Admitting: Internal Medicine

## 2016-09-26 ENCOUNTER — Encounter: Payer: Self-pay | Admitting: Internal Medicine

## 2016-09-26 ENCOUNTER — Ambulatory Visit (INDEPENDENT_AMBULATORY_CARE_PROVIDER_SITE_OTHER): Payer: Commercial Managed Care - HMO | Admitting: Internal Medicine

## 2016-09-26 ENCOUNTER — Other Ambulatory Visit: Payer: Self-pay | Admitting: Internal Medicine

## 2016-09-26 VITALS — BP 126/82 | HR 80 | Ht 66.0 in | Wt 256.0 lb

## 2016-09-26 DIAGNOSIS — Z1239 Encounter for other screening for malignant neoplasm of breast: Secondary | ICD-10-CM

## 2016-09-26 DIAGNOSIS — K599 Functional intestinal disorder, unspecified: Secondary | ICD-10-CM | POA: Diagnosis not present

## 2016-09-26 DIAGNOSIS — D508 Other iron deficiency anemias: Secondary | ICD-10-CM | POA: Diagnosis not present

## 2016-09-26 DIAGNOSIS — G909 Disorder of the autonomic nervous system, unspecified: Secondary | ICD-10-CM

## 2016-09-26 DIAGNOSIS — G901 Familial dysautonomia [Riley-Day]: Secondary | ICD-10-CM

## 2016-09-26 DIAGNOSIS — R911 Solitary pulmonary nodule: Secondary | ICD-10-CM

## 2016-09-26 DIAGNOSIS — K220 Achalasia of cardia: Secondary | ICD-10-CM | POA: Diagnosis not present

## 2016-09-26 DIAGNOSIS — K224 Dyskinesia of esophagus: Secondary | ICD-10-CM

## 2016-09-26 DIAGNOSIS — N181 Chronic kidney disease, stage 1: Secondary | ICD-10-CM

## 2016-09-26 DIAGNOSIS — F411 Generalized anxiety disorder: Secondary | ICD-10-CM

## 2016-09-26 DIAGNOSIS — Z1231 Encounter for screening mammogram for malignant neoplasm of breast: Secondary | ICD-10-CM

## 2016-09-26 DIAGNOSIS — E559 Vitamin D deficiency, unspecified: Secondary | ICD-10-CM

## 2016-09-26 NOTE — Patient Instructions (Signed)
Breast Self-Awareness Breast self-awareness means being familiar with how your breasts look and feel. It involves checking your breasts regularly and reporting any changes to your health care provider. Practicing breast self-awareness is important. A change in your breasts can be a sign of a serious medical problem. Being familiar with how your breasts look and feel allows you to find any problems early, when treatment is more likely to be successful. All women should practice breast self-awareness, including women who have had breast implants. How to do a breast self-exam One way to learn what is normal for your breasts and whether your breasts are changing is to do a breast self-exam. To do a breast self-exam: Look for Changes   1. Remove all the clothing above your waist. 2. Stand in front of a mirror in a room with good lighting. 3. Put your hands on your hips. 4. Push your hands firmly downward. 5. Compare your breasts in the mirror. Look for differences between them (asymmetry), such as:  Differences in shape.  Differences in size.  Puckers, dips, and bumps in one breast and not the other. 6. Look at each breast for changes in your skin, such as:  Redness.  Scaly areas. 7. Look for changes in your nipples, such as:  Discharge.  Bleeding.  Dimpling.  Redness.  A change in position. Feel for Changes   Carefully feel your breasts for lumps and changes. It is best to do this while lying on your back on the floor and again while sitting or standing in the shower or tub with soapy water on your skin. Feel each breast in the following way:  Place the arm on the side of the breast you are examining above your head.  Feel your breast with the other hand.  Start in the nipple area and make  inch (2 cm) overlapping circles to feel your breast. Use the pads of your three middle fingers to do this. Apply light pressure, then medium pressure, then firm pressure. The light pressure  will allow you to feel the tissue closest to the skin. The medium pressure will allow you to feel the tissue that is a little deeper. The firm pressure will allow you to feel the tissue close to the ribs.  Continue the overlapping circles, moving downward over the breast until you feel your ribs below your breast.  Move one finger-width toward the center of the body. Continue to use the  inch (2 cm) overlapping circles to feel your breast as you move slowly up toward your collarbone.  Continue the up and down exam using all three pressures until you reach your armpit. Write Down What You Find   Write down what is normal for each breast and any changes that you find. Keep a written record with breast changes or normal findings for each breast. By writing this information down, you do not need to depend only on memory for size, tenderness, or location. Write down where you are in your menstrual cycle, if you are still menstruating. If you are having trouble noticing differences in your breasts, do not get discouraged. With time you will become more familiar with the variations in your breasts and more comfortable with the exam. How often should I examine my breasts? Examine your breasts every month. If you are breastfeeding, the best time to examine your breasts is after a feeding or after using a breast pump. If you menstruate, the best time to examine your breasts is 5-7 days   after your period is over. During your period, your breasts are lumpier, and it may be more difficult to notice changes. When should I see my health care provider? See your health care provider if you notice:  A change in shape or size of your breasts or nipples.  A change in the skin of your breast or nipples, such as a reddened or scaly area.  Unusual discharge from your nipples.  A lump or thick area that was not there before.  Pain in your breasts.  Anything that concerns you. This information is not intended to  replace advice given to you by your health care provider. Make sure you discuss any questions you have with your health care provider. Document Released: 05/08/2005 Document Revised: 10/14/2015 Document Reviewed: 03/28/2015 Elsevier Interactive Patient Education  2017 [employer]  

## 2016-09-26 NOTE — Progress Notes (Signed)
Date:  09/26/2016   Name:  Judy Gibbs   DOB:  06/02/1965   MRN:  [id number]   Chief Complaint: Establish Care (New to area- just moved to [place] from [place]. ) and Dehydration (Has port-a-cath. Causes her to become dehydrated extremely. Causes fluid to build up around ankles. [facility] and kidneys are not a problem. Stopped saline therapy and cramping has gotten worse. - Wants advice about saline therapy. Has flucuations in GFR count. Has not had saline therapy in 3 months. 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  [facility] and still followed there.  Eventually underwent Ileostomy.  Possibly felt to have Hirschprung's disease undiagnosed.   Additional sx are pelvic and abdominal pain, gastroparesis, nausea.  She is treated with multiple medications - marinol, bentyl, phenergan, omeprazole, and carafate.  She is having more constipation now involving her small intestine.  POTS - has had trouble keeping up her oral fluids.  Also fought with recurrent UTIs.   In the past had a porta-cath to get continuous saline.  She subsequently got a fungal port infection and had to have it removed.  After several years, she began to feel that she was not keeping up fluids so another port was placed.  She used it for a while but then began to have fluid retention, elevated blood pressure and no improvement in her abdominal sx. She stopped the fluids about 3 months ago - swelling resolved,  She has not had worsening of any of her symptoms since.  She would like to have the port removed as she is very anxious about another infection.  Currently having [facility] flushed [facility].  Iron deficiency anemia - [facility] H[facility] and getting iron infusions through the port.  Cause felt to be poor absorption.  We discussed keeping the port for now and she agrees.  Anxiety - chronic problem.   Has previous diagnosis of depression and was on Prozac but no longer.  Taking trazodone for sleep and doing well.  She has a special needs daughter who requires constant care. Vitamin D def - started 3 months ago on high dose vitamin d.  Now due to have recheck and requesting that done here.    Review of Systems  Constitutional: Negative for chills, fatigue and unexpected weight change.  Respiratory: Negative for chest tightness, shortness of breath and wheezing.   Cardiovascular: Negative for chest pain, palpitations and leg swelling.  Gastrointestinal: Positive for abdominal pain and constipation.  Endocrine: Negative for polydipsia and polyuria.  Genitourinary: Negative for dysuria and hematuria.  Skin: Negative for color change and rash.  Neurological: Negative for dizziness, syncope, numbness and headaches.  Hematological: Negative for adenopathy.  Psychiatric/Behavioral: Negative for sleep disturbance.    Patient Active Problem List   Diagnosis Date Noted  . Iron deficiency anemia secondary to inadequate dietary iron intake 01/28/2016  . PD (perceptive deafness), asymmetrical 07/26/2015  . Fibromyalgia 07/07/2015  . Ileostomy in place [facility](HCC) 07/07/2015  . Gastrointestinal dysmotility 07/07/2015  . 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 Chronic pain syndrome 07/05/2015  . Parastomal hernia without obstruction or gangrene 05/05/2015  . Hypertensive lower esophageal sphincter 01/03/2015  . Pulmonary nodule 09/16/2014  . Anxiety disorder, unspecified 03/14/2014  . Dysautonomia 09/01/2011  . Interstitial cystitis 03/24/2011  . GERD 10/28/2009  . Colonic inertia 10/28/2009  Prior to Admission medications   Medication Sig Start Date End Date Taking? Authorizing Provider  acetaminophen (TYLENOL) 500 MG tablet Take 500 mg by mouth every 6 (six) hours as needed.   Yes [provider]  calcium-vitamin D (OSCAL) 250-125 MG-UNIT per  tablet Take 2 tablets by mouth every morning.    Yes [provider]  dicyclomine (BENTYL) 20 MG tablet Take 20 mg by mouth 3 (three) times daily. 09/08/16  Yes [provider]  dronabinol (MARINOL) 5 MG capsule Take 5 mg by mouth 2 (two) times daily before a meal.   Yes [provider]  glucose blood (ACCU-CHEK AVIVA PLUS) test strip  06/03/12  Yes [provider]  isosorbide dinitrate (ISORDIL) 10 MG tablet Take 10 mg by mouth 3 (three) times daily.   Yes [provider]  Multiple Vitamins-Minerals (ADEKS) chewable tablet Chew 2 tablets by mouth every morning. Multivitamin gummie   Yes [provider]  omeprazole (PRILOSEC) 40 MG capsule Take 40 mg by mouth 2 (two) times daily.   Yes [provider]  Polyethylene Glycol 1450 (BASE A POLYETHYLENE GLYCOL) POWD by Does not apply route.   Yes [provider]  promethazine (PHENERGAN) 6.25 MG/5ML syrup Take by mouth every 6 (six) hours as needed for nausea or vomiting.   Yes [provider]  simethicone (MYLICON) 125 MG chewable tablet Chew 125 mg by mouth every 6 (six) hours as needed for flatulence.   Yes [provider]  sucralfate (CARAFATE) 1 GM/10ML suspension Take 10 mLs (1 g total) by mouth 2 (two) times daily. 07/06/15  Yes Altamese Dilling, MD  traZODone (DESYREL) 150 MG tablet Take 1 tablet (150 mg total) by mouth at bedtime. 07/08/15  Yes Jimmy Footman, MD  Vitamin D, Ergocalciferol, (DRISDOL) 50000 units CAPS capsule Take by mouth.   Yes [provider]    Allergies  Allergen Reactions  . Celecoxib Hives  . Dicyclomine Other (See Comments)    Caused more intestinal cramping and pain  . Escitalopram Oxalate Diarrhea  . Hydromorphone Hcl Other (See Comments)    Skakes  . Piperacillin-Tazobactam In Dex Other (See Comments)    Acute interstitial nephritis (creatinine increasing with urine eosinophilia). Acute interstitial  nephritis (creatinine increasing with urine eosinophilia).  . Prozac [Fluoxetine Hcl] Other (See Comments)    Nervous  . Tape Dermatitis    Paper tape  . Reglan [Metoclopramide] Anxiety    Past Surgical History:  Procedure Laterality Date  . ABDOMINAL HYSTERECTOMY  2001   has one ovary  . CHOLECYSTECTOMY    . COLONOSCOPY  08/08/2001   normal, Dr. Lina Sar  . DIVERTING ILEOSTOMY  2013  . EDG  06/28/1987   Dr. Matthias Hughs, bile in stomach  . ESOPHAGOGASTRODUODENOSCOPY  12/20/1993   mild antral gastritis, Dr. Lina Sar  . ILEOSTOMY REVISION    . TOTAL KNEE ARTHROPLASTY  89  & 10   R & L  . UPPER GASTROINTESTINAL ENDOSCOPY  06/15/2006   hiatal hernia, gastritis  . UPPER GASTROINTESTINAL ENDOSCOPY  07/04/2001   Dr. Lina Sar    Social History  Substance Use Topics  . Smoking status: Former Smoker    Quit date: 05/22/1982  . Smokeless tobacco: Never Used  . Alcohol use No     Medication list has been reviewed and updated.   Physical Exam  Constitutional: She is oriented to person, place, and time. She appears well-developed. No distress.  HENT:  Head: Normocephalic and atraumatic.  Neck:  Neck supple. No thyromegaly present.  Cardiovascular: Normal rate, regular rhythm, S1 normal and normal heart sounds.   Pulmonary/Chest: Effort normal and breath sounds normal. No respiratory distress. She has no wheezes.  Porta cath in right upper chest  Musculoskeletal: She exhibits no edema or tenderness.  Neurological: She is alert and oriented to person, place, and time.  Skin: Skin is warm and dry. No rash noted.  Psychiatric: She has a normal mood and affect. Her speech is normal and behavior is normal. Thought content normal.  Nursing note and vitals reviewed.   BP 126/82 (BP Location: Right Arm, Patient Position: Sitting, Cuff Size: Large)   Pulse 80   Ht 5\' 6"  (1.676 m)   Wt 256 lb (116.1 kg)   SpO2 98%   BMI 41.32 kg/m   Assessment and Plan: 1. Colonic inertia Followed  [facility] at [facility] Dr. Alycia Rossetti  2. Iron deficiency anemia secondary to inadequate dietary iron intake Continue infusions at [facility] via port  3. Dysautonomia Continue fluids as able Hold off on infusions for now - no indication unless her renal function declines, she is hypotensive or UTI recurs  4. Hypertensive lower esophageal sphincter On Isordil  5. Vitamin D deficiency May need to continue high dose - VITAMIN D 25 Hydroxy (Vit-D Deficiency, Fractures)  6. Generalized anxiety disorder On trazodone  7. Pulmonary nodule Appears resolved per CT in record  8. Breast cancer screening - MM DIGITAL SCREENING BILATERAL; Future   No orders of the defined types were placed in this encounter.   Bari Edward, MD [facility]  09/26/2016

## 2016-09-27 LAB — VITAMIN D 25 HYDROXY (VIT D DEFICIENCY, FRACTURES): Vit D, 25-Hydroxy: 35.8 ng/mL (ref 30.0–100.0)

## 2016-10-06 ENCOUNTER — Encounter: Payer: Self-pay | Admitting: Internal Medicine

## 2016-10-10 ENCOUNTER — Telehealth: Payer: Self-pay

## 2016-10-10 NOTE — Telephone Encounter (Signed)
Pt called stating she needs written Rx for home nurse- Burney GauzeJustin Dixon- to do monthly port access. Jill AlexandersJustin phone number is [phone] if we need further information.

## 2016-10-18 ENCOUNTER — Inpatient Hospital Stay: Admission: RE | Admit: 2016-10-18 | Payer: Self-pay | Source: Ambulatory Visit

## 2016-10-23 ENCOUNTER — Encounter: Payer: Self-pay | Admitting: Internal Medicine

## 2016-10-23 DIAGNOSIS — Z452 Encounter for adjustment and management of vascular access device: Secondary | ICD-10-CM | POA: Insufficient documentation

## 2016-10-24 ENCOUNTER — Ambulatory Visit
Admission: RE | Admit: 2016-10-24 | Discharge: 2016-10-24 | Disposition: A | Discharge status: Home / Self Care | Payer: Commercial Managed Care - HMO | Source: Ambulatory Visit | Attending: Internal Medicine | Admitting: Internal Medicine

## 2016-10-24 DIAGNOSIS — Z1231 Encounter for screening mammogram for malignant neoplasm of breast: Secondary | ICD-10-CM | POA: Diagnosis present

## 2016-10-24 DIAGNOSIS — Z1239 Encounter for other screening for malignant neoplasm of breast: Secondary | ICD-10-CM

## 2016-11-01 ENCOUNTER — Inpatient Hospital Stay
Admission: RE | Admit: 2016-11-01 | Discharge: 2016-11-01 | Disposition: A | Discharge status: Home / Self Care | Payer: Self-pay | Source: Ambulatory Visit | Attending: *Deleted | Admitting: *Deleted

## 2016-11-01 ENCOUNTER — Other Ambulatory Visit: Payer: Self-pay | Admitting: *Deleted

## 2016-11-01 DIAGNOSIS — Z9289 Personal history of other medical treatment: Secondary | ICD-10-CM

## 2016-11-09 ENCOUNTER — Ambulatory Visit (INDEPENDENT_AMBULATORY_CARE_PROVIDER_SITE_OTHER): Payer: 59 | Admitting: Internal Medicine

## 2016-11-09 ENCOUNTER — Encounter: Payer: Self-pay | Admitting: Internal Medicine

## 2016-11-09 VITALS — BP 112/74 | HR 84 | Ht 66.0 in | Wt 256.0 lb

## 2016-11-09 DIAGNOSIS — R0781 Pleurodynia: Secondary | ICD-10-CM | POA: Diagnosis not present

## 2016-11-09 DIAGNOSIS — W57XXXA Bitten or stung by nonvenomous insect and other nonvenomous arthropods, initial encounter: Secondary | ICD-10-CM | POA: Diagnosis not present

## 2016-11-09 DIAGNOSIS — K9289 Other specified diseases of the digestive system: Secondary | ICD-10-CM

## 2016-11-09 DIAGNOSIS — S30861A Insect bite (nonvenomous) of abdominal wall, initial encounter: Secondary | ICD-10-CM | POA: Diagnosis not present

## 2016-11-09 NOTE — Progress Notes (Signed)
Date:  11/09/2016   Name:  Judy Gibbs   DOB:  20-Mar-1966   MRN:  [id number]   Chief Complaint: Flank Pain (L) side pain on rib cage. Hurts when taking deep breath. "remind me of pneumonia pain". Feel like "nail is jabbing me". When having pneumonia previously was told have collapsed lung. This pain has been going on for a month now. ) and Tick Removal (Removed this morning. Was not hard to remove. Removed from left hip. Left red spot with darkness in middle. No symptoms just want to be looked out. )  Left Rib pain - has had intermittent pain in the lateral left ribs for several years.  Worse at times, esp with movement, sweeping, twisting.  Also worse with cough.  Pain is sharp at times but brief.  She has hx of pneumonia and questionable pneumothorax in 2016 and is concerned that this is related.  Tick bite - pulled tiny tick off of lower left abdomen today.  Does not think it was attached for long.  No pain or itching in the area now.  Review of Systems  Constitutional: Negative for chills, fatigue and fever.  Respiratory: Negative for chest tightness and shortness of breath.   Cardiovascular: Positive for chest pain (rib pain). Negative for palpitations.  Skin: Positive for rash.    Patient Active Problem List   Diagnosis Date Noted  . Encounter for venous access device care 10/23/2016  . Vitamin D deficiency 09/26/2016  . Chronic renal insufficiency, stage 1 09/26/2016  . Iron deficiency anemia secondary to inadequate dietary iron intake 01/28/2016  . PD (perceptive deafness), asymmetrical 07/26/2015  . Fibromyalgia 07/07/2015  . Ileostomy in place [facility]) 07/07/2015  . Gastrointestinal dysmotility 07/07/2015  . 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 Chronic pain syndrome 07/05/2015  . Parastomal hernia without obstruction or gangrene 05/05/2015  . Hypertensive lower esophageal sphincter 01/03/2015  . Anxiety disorder, unspecified  03/14/2014  . Dysautonomia 09/01/2011  . Interstitial cystitis 03/24/2011  . GERD 10/28/2009  . 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 Authorizing Provider  acetaminophen (TYLENOL) 500 MG tablet Take 500 mg by mouth every 6 (six) hours as needed.   Yes [provider]  calcium-vitamin D (OSCAL) 250-125 MG-UNIT per tablet Take 2 tablets by mouth every morning.    Yes [provider]  dicyclomine (BENTYL) 20 MG tablet Take 20 mg by mouth 3 (three) times daily. 09/08/16  Yes [provider]  dronabinol (MARINOL) 5 MG capsule Take 5 mg by mouth 2 (two) times daily before a meal.   Yes [provider]  glucose blood (ACCU-CHEK AVIVA PLUS) test strip  06/03/12  Yes [provider]  isosorbide dinitrate (ISORDIL) 10 MG tablet Take 10 mg by mouth 3 (three) times daily.   Yes [provider]  Multiple Vitamins-Minerals (ADEKS) chewable tablet Chew 2 tablets by mouth every morning. Multivitamin gummie   Yes [provider]  omeprazole (PRILOSEC) 40 MG capsule Take 40 mg by mouth 2 (two) times daily.   Yes [provider]  Polyethylene Glycol 1450 (BASE A POLYETHYLENE GLYCOL) POWD by Does not apply route.   Yes [provider]  promethazine (PHENERGAN) 6.25 MG/5ML syrup Take by mouth every 6 (six) hours as needed for nausea or vomiting.   Yes [provider]  simethicone (MYLICON) 125 MG chewable tablet Chew 125 mg by mouth  every 6 (six) hours as needed for flatulence.   Yes [provider]  sucralfate (CARAFATE) 1 GM/10ML suspension Take 10 mLs (1 g total) by mouth 2 (two) times daily. 07/06/15  Yes Altamese DillingVachhani, Vaibhavkumar, MD  traZODone (DESYREL) 150 MG tablet Take 1 tablet (150 mg total) by mouth at bedtime. 07/08/15  Yes Jimmy FootmanHernandez-Gonzalez, Andrea, MD  Vitamin D, Ergocalciferol, (DRISDOL) 50000 units CAPS capsule Take by mouth.   Yes [provider]    Allergies  Allergen Reactions  . Celecoxib Hives  . Escitalopram Oxalate Diarrhea  . Hydromorphone Hcl Other (See Comments)    Skakes  . Piperacillin-Tazobactam In Dex Other (See Comments)    Acute interstitial nephritis (creatinine increasing with urine eosinophilia). Acute interstitial nephritis (creatinine increasing with urine eosinophilia).  . Prozac [Fluoxetine Hcl] Other (See Comments)    Nervous  . Tape Dermatitis    Paper tape  . Reglan [Metoclopramide] Anxiety    Past Surgical History:  Procedure Laterality Date  . ABDOMINAL HYSTERECTOMY  2001   has one ovary  . CHOLECYSTECTOMY    . COLONOSCOPY  08/08/2001   normal, Dr. Lina Sarora [place]  . DIVERTING ILEOSTOMY  2013  . EDG  06/28/1987   Dr. Matthias HughsBuccini, bile in stomach  . ESOPHAGOGASTRODUODENOSCOPY  12/20/1993   mild antral gastritis, Dr. Lina Sarora [place]  . ILEOSTOMY REVISION    . TOTAL KNEE ARTHROPLASTY  89  & 9   R & L  . UPPER GASTROINTESTINAL ENDOSCOPY  06/15/2006   hiatal hernia, gastritis  . UPPER GASTROINTESTINAL ENDOSCOPY  07/04/2001   Dr. Lina Sarora [place]    Social History  Substance Use Topics  . Smoking status: Former Smoker    Quit date: 05/22/1982  . Smokeless tobacco: Never Used  . Alcohol use No     Medication list has been reviewed and updated.   Physical Exam  Constitutional: She is oriented to person, place, and time. She appears well-developed. No distress.  HENT:  Head: Normocephalic and atraumatic.  Neck: Normal range of motion. Neck supple.  Cardiovascular: Normal rate, regular rhythm and normal heart sounds.   Pulmonary/Chest: Effort normal and breath sounds normal. No respiratory distress. She has no decreased breath sounds. She has no wheezes. She has no rhonchi. She has no rales. She exhibits tenderness (along lateral mid left axillary line). She exhibits no crepitus and no deformity.  Musculoskeletal: She exhibits no edema or tenderness.  Neurological: She is alert and oriented  to person, place, and time.  Skin: Skin is warm and dry. No rash noted.     Tiny red macule at site of tick bite - no surrounding erythema  Psychiatric: She has a normal mood and affect. Her behavior is normal. Thought content normal.  Nursing note and vitals reviewed.   BP 112/74   Pulse 84   Ht 5\' 6"  (1.676 m)   Wt 256 lb (116.1 kg)   SpO2 96%   BMI 41.32 kg/m   Assessment and Plan: 1. Rib pain on left side Likely not related to lung pathology Patient reassured - may be related to other chronic pain syndrome to be worked up in the near future by Rheumatology to exclude Ehlers-Danlos syndrome  2. Tick bite of abdomen, initial encounter Appears benign - monitor for sx Can apply cortisone cream as needed   No orders of the defined types were placed in this encounter.   Bari EdwardLaura Mariana Wiederholt, MD [facility]  11/09/2016

## 2016-11-19 HISTORY — PX: CELIAC PLEXUS BLOCK: SHX1320

## 2016-11-20 ENCOUNTER — Encounter: Payer: Self-pay | Admitting: Internal Medicine

## 2016-11-21 NOTE — Telephone Encounter (Signed)
Patient asking [facility]

## 2016-11-28 ENCOUNTER — Encounter: Payer: Self-pay | Admitting: Internal Medicine

## 2016-11-30 ENCOUNTER — Encounter: Payer: Self-pay | Admitting: Internal Medicine

## 2016-12-01 ENCOUNTER — Other Ambulatory Visit: Payer: Self-pay | Admitting: Internal Medicine

## 2016-12-01 MED ORDER — TRAZODONE HCL 150 MG PO TABS: 150.0000 mg | ORAL_TABLET | Freq: Every day | ORAL | 5 refills | Status: DC

## 2016-12-05 ENCOUNTER — Telehealth: Payer: Self-pay

## 2016-12-05 NOTE — Telephone Encounter (Signed)
Pt called stating she is having strong smells in nostrils of ammonia- has had smells of smoke before. Wants advice.

## 2016-12-05 NOTE — Telephone Encounter (Signed)
Pt stated thse smells come right before a migraine comes on- is going to try to drink more water. Her Nurse told her to take in more sodium as well. No sinus infection currently. Sinus Pressure is bad tho- Told if feels like getting sinus infection to come in and be seen. She verbalized understanding.

## 2016-12-05 NOTE — Telephone Encounter (Signed)
Ammonia smells can come from sinus infections or sometimes just occur as a dysfunction of the olifactory organs in the nasal passages.  Since she has also smelled smoke in the past, I suspect she has this.  There is no treatment but if she thinks she has infection, she should be checked.

## 2016-12-10 ENCOUNTER — Ambulatory Visit
Admission: EM | Admit: 2016-12-10 | Discharge: 2016-12-10 | Disposition: A | Discharge status: Home / Self Care | Payer: 59 | Attending: Emergency Medicine | Admitting: Emergency Medicine

## 2016-12-10 DIAGNOSIS — N3090 Cystitis, unspecified without hematuria: Secondary | ICD-10-CM

## 2016-12-10 DIAGNOSIS — N390 Urinary tract infection, site not specified: Secondary | ICD-10-CM | POA: Diagnosis not present

## 2016-12-10 LAB — URINALYSIS, COMPLETE (UACMP) WITH MICROSCOPIC
BILIRUBIN URINE: NEGATIVE
GLUCOSE, UA: NEGATIVE mg/dL
KETONES UR: NEGATIVE mg/dL
Nitrite: NEGATIVE
PH: 7 (ref 5.0–8.0)
PROTEIN: NEGATIVE mg/dL
Specific Gravity, Urine: 1.01 (ref 1.005–1.030)

## 2016-12-10 MED ORDER — CEPHALEXIN 500 MG PO CAPS: 500.0000 mg | ORAL_CAPSULE | Freq: Two times a day (BID) | ORAL | 0 refills | Status: DC

## 2016-12-10 NOTE — ED Provider Notes (Signed)
CSN: [id number]     Arrival date & time 12/10/16  0913 History   None    Chief Complaint  Patient presents with  . Dysuria  . Polyuria  . Abdominal Pain   (Consider location/radiation/quality/duration/timing/severity/associated sxs/prior Treatment) HPI 51 year old female who presents with dysuria frequency urgency and in satiety started last night. She has a long history of UTIs and she states that this is very similar. She underwent a celiac plexus block on 12/06/2016 and that note was reviewed in detail. He states that she did not receive any additional relief of her chronic abdominal pain and is considering having an implantable pain stimulator in the future. Abdominal pain is chronic and has not changed significantly. He has been drinking increased water but continues to have the above symptoms. She denies any vaginal discharge. She's had no fever or chills.        Past Medical History:  Diagnosis Date  . Anxiety   . Colonic inertia   . DDD (degenerative disc disease), cervical   . Depression   . Fibromyalgia   . Gastroparesis   . GERD (gastroesophageal reflux disease)   . Hypoglycemia   . Interstitial cystitis   . Obesity   . Pelvic floor dysfunction   . POTS (postural orthostatic tachycardia syndrome)   . Proctalgia   . Raynauds phenomenon   . Vertigo    Past Surgical History:  Procedure Laterality Date  . ABDOMINAL HYSTERECTOMY  2001   has one ovary  . CHOLECYSTECTOMY    . COLONOSCOPY  08/08/2001   normal, Dr. Lina Sar  . DIVERTING ILEOSTOMY  2013  . EDG  06/28/1987   Dr. Matthias Hughs, bile in stomach  . ESOPHAGOGASTRODUODENOSCOPY  12/20/1993   mild antral gastritis, Dr. Lina Sar  . ILEOSTOMY REVISION    . TOTAL KNEE ARTHROPLASTY  89  & 10   R & L  . UPPER GASTROINTESTINAL ENDOSCOPY  06/15/2006   hiatal hernia, gastritis  . UPPER GASTROINTESTINAL ENDOSCOPY  07/04/2001   Dr. Lina Sar   Family History  Problem Relation Age of Onset  . Colon polyps Paternal  Uncle   . Esophageal cancer Paternal Uncle   . Diabetes Father   . Heart disease Father   . Stroke Father   . Hyperlipidemia Father   . Hyperlipidemia Mother   . Cancer Paternal Aunt        breast  . Breast cancer Paternal Aunt 37  . Early death Neg Hx    Social History  Substance Use Topics  . Smoking status: Former Smoker    Quit date: 05/22/1982  . Smokeless tobacco: Never Used  . Alcohol use No   OB History    No data available     Review of Systems  Constitutional: Positive for activity change. Negative for chills, fatigue and fever.  Genitourinary: Positive for dysuria, frequency and urgency. Negative for vaginal discharge.  All other systems reviewed and are negative.   Allergies  Celecoxib; Escitalopram oxalate; Hydromorphone hcl; Piperacillin-tazobactam in dex; Prozac [fluoxetine hcl]; Tape; and Reglan [metoclopramide]  Home Medications   Prior to Admission medications   Medication Sig Start Date End Date Taking? Authorizing Provider  acetaminophen (TYLENOL) 500 MG tablet Take 500 mg by mouth every 6 (six) hours as needed.   Yes [provider]  dicyclomine (BENTYL) 20 MG tablet Take 20 mg by mouth 3 (three) times daily. 09/08/16  Yes [provider]  dronabinol (MARINOL) 5 MG capsule Take 5 mg by mouth 2 (  two) times daily before a meal.   Yes [provider]  glucose blood (ACCU-CHEK AVIVA PLUS) test strip  06/03/12  Yes [provider]  isosorbide dinitrate (ISORDIL) 10 MG tablet Take 10 mg by mouth 3 (three) times daily.   Yes [provider]  omeprazole (PRILOSEC) 40 MG capsule Take 40 mg by mouth 2 (two) times daily.   Yes [provider]  Polyethylene Glycol 1450 (BASE A POLYETHYLENE GLYCOL) POWD by Does not apply route.   Yes [provider]  promethazine (PHENERGAN) 6.25 MG/5ML syrup Take by mouth every 6 (six) hours as needed for nausea or vomiting.   Yes [provider]  simethicone  (MYLICON) 125 MG chewable tablet Chew 125 mg by mouth every 6 (six) hours as needed for flatulence.   Yes [provider]  sucralfate (CARAFATE) 1 GM/10ML suspension Take 10 mLs (1 g total) by mouth 2 (two) times daily. 07/06/15  Yes Altamese Dilling, MD  traZODone (DESYREL) 150 MG tablet Take 1 tablet (150 mg total) by mouth at bedtime. 12/01/16  Yes Reubin Milan, MD  Vitamin D, Ergocalciferol, (DRISDOL) 50000 units CAPS capsule Take by mouth.   Yes [provider]  cephALEXin (KEFLEX) 500 MG capsule Take 1 capsule (500 mg total) by mouth 2 (two) times daily. 12/10/16   Lutricia Feil, PA-C   Meds Ordered and Administered this Visit  Medications - No data to display  BP 127/75 (BP Location: Left Arm)   Pulse (!) 113   Temp 97.7 F (36.5 C) (Oral)   Resp 18   Ht 5\' 7"  (1.702 m)   Wt 251 lb (113.9 kg)   SpO2 97%   BMI 39.31 kg/m  No data found.   Physical Exam  Constitutional: She is oriented to person, place, and time. She appears well-developed and well-nourished. No distress.  HENT:  Head: Normocephalic.  Eyes: Pupils are equal, round, and reactive to light.  Neck: Normal range of motion.  Pulmonary/Chest: Effort normal and breath sounds normal.  Abdominal:  The patient has bilateral mild CVA tenderness  Musculoskeletal:  Doubt patient does have a motility disorder  Neurological: She is alert and oriented to person, place, and time.  Skin: Skin is warm and dry. She is not diaphoretic.  Psychiatric: She has a normal mood and affect. Her behavior is normal. Judgment and thought content normal.  Nursing note and vitals reviewed.   Urgent Care Course     Procedures (including critical care time)  Labs Review Labs Reviewed  URINALYSIS, COMPLETE (UACMP) WITH MICROSCOPIC - Abnormal; Notable for the following:       Result Value   APPearance CLOUDY (*)    Hgb urine dipstick SMALL (*)    Leukocytes, UA LARGE (*)    Squamous Epithelial / LPF 0-5  (*)    Bacteria, UA MANY (*)    All other components within normal limits  URINE CULTURE    Imaging Review No results found.   Visual Acuity Review  Right Eye Distance:   Left Eye Distance:   Bilateral Distance:    Right Eye Near:   Left Eye Near:    Bilateral Near:         MDM   1. Lower urinary tract infectious disease   2. Cystitis    Discharge Medication List as of 12/10/2016 10:04 AM    START taking these medications   Details  cephALEXin (KEFLEX) 500 MG capsule Take 1 capsule (500 mg total) by  mouth 2 (two) times daily., Starting Sun 12/10/2016, Normal      Plan: 1. Test/x-ray results and diagnosis reviewed with patient 2. rx as per orders; risks, benefits, potential side effects reviewed with patient 3. Recommend supportive treatment with Increased fluids. Cultures and sensitivities of the urine should be available in 48 hours. If she is not improving she should follow-up with her primary care physician, Dr. Judithann GravesBerglund 4. F/u prn if symptoms worsen or don't improve     Lutricia FeilRoemer, Alaiyah Bollman P, PA-C 12/10/16 1011

## 2016-12-10 NOTE — ED Triage Notes (Signed)
51 year old Caucasian woman is here today with her husband with complaints of Dysuria, Polyuria, generalized abdomen pain that started last night. She states she does have a history of UTIs.

## 2016-12-12 LAB — URINE CULTURE

## 2016-12-13 ENCOUNTER — Encounter: Payer: Self-pay | Admitting: Internal Medicine

## 2016-12-14 NOTE — Telephone Encounter (Signed)
MyChart msg

## 2016-12-19 ENCOUNTER — Other Ambulatory Visit: Payer: Self-pay

## 2016-12-19 ENCOUNTER — Encounter: Payer: Self-pay | Admitting: Internal Medicine

## 2016-12-19 DIAGNOSIS — H9193 Unspecified hearing loss, bilateral: Secondary | ICD-10-CM

## 2016-12-19 DIAGNOSIS — H9203 Otalgia, bilateral: Secondary | ICD-10-CM

## 2016-12-19 DIAGNOSIS — H938X2 Other specified disorders of left ear: Secondary | ICD-10-CM

## 2016-12-28 ENCOUNTER — Ambulatory Visit (INDEPENDENT_AMBULATORY_CARE_PROVIDER_SITE_OTHER): Payer: Medicare Other | Admitting: Internal Medicine

## 2016-12-28 ENCOUNTER — Encounter: Payer: Self-pay | Admitting: Internal Medicine

## 2016-12-28 ENCOUNTER — Other Ambulatory Visit: Payer: Self-pay | Admitting: Internal Medicine

## 2016-12-28 VITALS — BP 128/82 | HR 93 | Ht 66.0 in | Wt 251.0 lb

## 2016-12-28 DIAGNOSIS — K9289 Other specified diseases of the digestive system: Secondary | ICD-10-CM

## 2016-12-28 DIAGNOSIS — N3000 Acute cystitis without hematuria: Secondary | ICD-10-CM | POA: Diagnosis not present

## 2016-12-28 DIAGNOSIS — Z932 Ileostomy status: Secondary | ICD-10-CM | POA: Diagnosis not present

## 2016-12-28 LAB — POCT URINALYSIS DIPSTICK
BILIRUBIN UA: NEGATIVE
GLUCOSE UA: NEGATIVE
Ketones, UA: NEGATIVE
NITRITE UA: NEGATIVE
RBC UA: NEGATIVE
SPEC GRAV UA: 1.01 (ref 1.010–1.025)
Urobilinogen, UA: 0.2 E.U./dL
pH, UA: 6 (ref 5.0–8.0)

## 2016-12-28 MED ORDER — CIPROFLOXACIN HCL 250 MG PO TABS: 250.0000 mg | ORAL_TABLET | Freq: Two times a day (BID) | ORAL | 0 refills | Status: DC

## 2016-12-28 NOTE — Progress Notes (Signed)
Date:  12/28/2016   Name:  Judy Gibbs   DOB:  May 08, 1966   MRN:  [id number]   Chief Complaint: Urinary Tract Infection (Was seen in UC- did culture and was told no growth. She spoke with urologist and they stated she does have bladder infection. Patient took Cipro for a week and just finished Friday. Was told by urology to come here and give another urine speciman.  ) Treated by UC on 12/10/16 for UTI with Keflex.  Urine culture was negative - showed only mixed species.  Then seen by urology and told urine was still dirty and given Cipro Rx. She is feeling very poorly and wonders if she is dehydrated.  She has persistent nausea and worsening abdominal pain overall.  Recent celiac plexus ablation failed.  Now looking to spinal stimulator. She is drinking and eating despite the nausea.  She had one episode of self induced vomiting 4 days ago when she felt as if food was sticking in her esophagus.   Urinary Tract Infection   This is a recurrent problem. Associated symptoms include nausea. Pertinent negatives include no chills or hematuria.      Review of Systems  Constitutional: Positive for fatigue. Negative for chills, fever and unexpected weight change.  Respiratory: Negative for chest tightness and shortness of breath.   Cardiovascular: Negative for chest pain and palpitations.  Gastrointestinal: Positive for abdominal distention, abdominal pain and nausea.  Genitourinary: Negative for dysuria and hematuria.    Patient Active Problem List   Diagnosis Date Noted  . Encounter for venous access device care 10/23/2016  . Vitamin D deficiency 09/26/2016  . Chronic renal insufficiency, stage 1 09/26/2016  . Iron deficiency anemia secondary to inadequate dietary iron intake 01/28/2016  . PD (perceptive deafness), asymmetrical 07/26/2015  . Fibromyalgia 07/07/2015  . Ileostomy in place [facility]) 07/07/2015  . Gastrointestinal dysmotility 07/07/2015  . Severe recurrent major depression  without psychotic features (HCC) 07/05/2015  . Chronic pain syndrome 07/05/2015  . Parastomal hernia without obstruction or gangrene 05/05/2015  . Hypertensive lower esophageal sphincter 01/03/2015  . Anxiety disorder, unspecified 03/14/2014  . Dysautonomia 09/01/2011  . Interstitial cystitis 03/24/2011  . GERD 10/28/2009  . 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 Authorizing Provider  acetaminophen (TYLENOL) 500 MG tablet Take 500 mg by mouth every 6 (six) hours as needed.    [provider]  cephALEXin (KEFLEX) 500 MG capsule Take 1 capsule (500 mg total) by mouth 2 (two) times daily. 12/10/16   Lutricia Feil, PA-C  dicyclomine (BENTYL) 20 MG tablet Take 20 mg by mouth 3 (three) times daily. 09/08/16   [provider]  dronabinol (MARINOL) 5 MG capsule Take 5 mg by mouth 2 (two) times daily before a meal.    [provider]  glucose blood (ACCU-CHEK AVIVA PLUS) test strip  06/03/12   [provider]  isosorbide dinitrate (ISORDIL) 10 MG tablet Take 10 mg by mouth 3 (three) times daily.    [provider]  omeprazole (PRILOSEC) 40 MG capsule Take 40 mg by mouth 2 (two) times daily.    [provider]  Polyethylene Glycol 1450 (BASE A POLYETHYLENE GLYCOL) POWD by Does not apply route.    [provider]  promethazine (PHENERGAN) 6.25 MG/5ML syrup Take by mouth every 6 (six) hours as needed for nausea or vomiting.    [provider]  simethicone (MYLICON) 125  MG chewable tablet Chew 125 mg by mouth every 6 (six) hours as needed for flatulence.    [provider]  sucralfate (CARAFATE) 1 GM/10ML suspension Take 10 mLs (1 g total) by mouth 2 (two) times daily. 07/06/15   Altamese DillingVachhani, Vaibhavkumar, MD  traZODone (DESYREL) 150 MG tablet Take 1 tablet (150 mg total) by mouth at bedtime. 12/01/16   Reubin MilanBerglund, Chapin Arduini H, MD  Vitamin D, Ergocalciferol, (DRISDOL)  50000 units CAPS capsule Take by mouth.    [provider]    Allergies  Allergen Reactions  . Celecoxib Hives  . Escitalopram Oxalate Diarrhea  . Hydromorphone Hcl Other (See Comments)    Skakes  . Piperacillin-Tazobactam In Dex Other (See Comments)    Acute interstitial nephritis (creatinine increasing with urine eosinophilia). Acute interstitial nephritis (creatinine increasing with urine eosinophilia).  . Prozac [Fluoxetine Hcl] Other (See Comments)    Nervous  . Tape Dermatitis    Paper tape  . Reglan [Metoclopramide] Anxiety    Past Surgical History:  Procedure Laterality Date  . ABDOMINAL HYSTERECTOMY  2001   has one ovary  . CHOLECYSTECTOMY    . COLONOSCOPY  08/08/2001   normal, Dr. Lina Sarora Brodie  . DIVERTING ILEOSTOMY  2013  . EDG  06/28/1987   Dr. Matthias HughsBuccini, bile in stomach  . ESOPHAGOGASTRODUODENOSCOPY  12/20/1993   mild antral gastritis, Dr. Lina Sarora Brodie  . ILEOSTOMY REVISION    . TOTAL KNEE ARTHROPLASTY  89  & 10   R & L  . UPPER GASTROINTESTINAL ENDOSCOPY  06/15/2006   hiatal hernia, gastritis  . UPPER GASTROINTESTINAL ENDOSCOPY  07/04/2001   Dr. Lina Sarora Brodie    Social History  Substance Use Topics  . Smoking status: Former Smoker    Quit date: 05/22/1982  . Smokeless tobacco: Never Used  . Alcohol use No     Medication list has been reviewed and updated.   Physical Exam  Constitutional: She is oriented to person, place, and time. She appears well-developed. She appears distressed.  HENT:  Head: Normocephalic and atraumatic.  Cardiovascular: Normal rate, regular rhythm and normal heart sounds.   Pulmonary/Chest: Effort normal and breath sounds normal. No respiratory distress. She has no wheezes.  Musculoskeletal: Normal range of motion.  Neurological: She is alert and oriented to person, place, and time.  Skin: Skin is warm, dry and intact. No rash noted.  Normal skin turgor  Psychiatric: Her behavior is normal. Thought content normal. Her mood  appears anxious.  Nursing note and vitals reviewed.   BP 128/82   Pulse 93   Ht 5\' 6"  (1.676 m)   Wt 251 lb (113.9 kg)   SpO2 97%   BMI 40.51 kg/m   Assessment and Plan: 1. Acute cystitis without hematuria UA benign today but will verify with cx - POCT urinalysis dipstick - Urine Culture  2. Gastrointestinal dysmotility Persistent/worsening sx under care of GI specialist Continue oral fluids and nutrition as able No clinical evidence of dehydration requiring intervention  3. Ileostomy in place [facility](HCC) Functioning well per patient   Meds ordered this encounter  Medications  . DISCONTD: ciprofloxacin (CIPRO) 250 MG tablet    Sig: Take 1 tablet (250 mg total) by mouth 2 (two) times daily.    Dispense:  6 tablet    Refill:  0    Bari EdwardLaura Neithan Day, MD [facility]  12/28/2016

## 2016-12-30 LAB — URINE CULTURE

## 2017-01-17 DIAGNOSIS — M1712 Unilateral primary osteoarthritis, left knee: Secondary | ICD-10-CM | POA: Insufficient documentation

## 2017-01-20 ENCOUNTER — Encounter: Payer: Self-pay | Admitting: Internal Medicine

## 2017-01-23 NOTE — Telephone Encounter (Signed)
Patient MyChart msg.

## 2017-02-16 ENCOUNTER — Encounter: Payer: Self-pay | Admitting: Internal Medicine

## 2017-02-16 NOTE — Telephone Encounter (Signed)
Patient request. Please Advise.

## 2017-02-19 ENCOUNTER — Encounter: Payer: Self-pay | Admitting: Internal Medicine

## 2017-02-19 ENCOUNTER — Other Ambulatory Visit: Payer: Self-pay

## 2017-02-19 DIAGNOSIS — Z95828 Presence of other vascular implants and grafts: Secondary | ICD-10-CM

## 2017-02-23 ENCOUNTER — Encounter: Payer: Self-pay | Admitting: Internal Medicine

## 2017-02-23 ENCOUNTER — Ambulatory Visit: Payer: Self-pay | Admitting: Internal Medicine

## 2017-02-23 NOTE — Telephone Encounter (Signed)
Labs

## 2017-02-26 NOTE — Telephone Encounter (Signed)
Please Advise

## 2017-02-26 NOTE — Telephone Encounter (Signed)
Judy Gibbs spoke with patient and faxing note.

## 2017-03-01 ENCOUNTER — Other Ambulatory Visit: Payer: Self-pay | Admitting: Internal Medicine

## 2017-03-02 ENCOUNTER — Encounter (INDEPENDENT_AMBULATORY_CARE_PROVIDER_SITE_OTHER): Payer: Medicare Other | Admitting: Internal Medicine

## 2017-03-02 ENCOUNTER — Encounter: Payer: Self-pay | Admitting: Internal Medicine

## 2017-03-02 DIAGNOSIS — Z932 Ileostomy status: Secondary | ICD-10-CM

## 2017-03-02 NOTE — Progress Notes (Signed)
Received orders from [facility].  Start of care was  01/21/16 but recertification period is from 11/16/16 through 01/14/17. Orders are reviewed, signed and faxed.

## 2017-03-02 NOTE — Progress Notes (Signed)
Received orders from [facility].  Recertification period 01/15/17 through 03/15/17. Orders are reviewed, signed and faxed.

## 2017-03-07 ENCOUNTER — Encounter: Payer: Self-pay | Admitting: Internal Medicine

## 2017-03-07 HISTORY — PX: OTHER SURGICAL HISTORY: SHX169

## 2017-05-31 DIAGNOSIS — M879 Osteonecrosis, unspecified: Secondary | ICD-10-CM | POA: Insufficient documentation

## 2017-06-21 ENCOUNTER — Other Ambulatory Visit: Payer: Self-pay | Admitting: Internal Medicine

## 2017-07-03 ENCOUNTER — Ambulatory Visit (INDEPENDENT_AMBULATORY_CARE_PROVIDER_SITE_OTHER): Payer: 59 | Admitting: Internal Medicine

## 2017-07-03 ENCOUNTER — Encounter: Payer: Self-pay | Admitting: Internal Medicine

## 2017-07-03 VITALS — BP 108/68 | HR 66 | Ht 66.0 in | Wt 245.0 lb

## 2017-07-03 DIAGNOSIS — Z932 Ileostomy status: Secondary | ICD-10-CM | POA: Diagnosis not present

## 2017-07-03 DIAGNOSIS — G901 Familial dysautonomia [Riley-Day]: Secondary | ICD-10-CM | POA: Diagnosis not present

## 2017-07-03 DIAGNOSIS — Z1239 Encounter for other screening for malignant neoplasm of breast: Secondary | ICD-10-CM

## 2017-07-03 DIAGNOSIS — Z0000 Encounter for general adult medical examination without abnormal findings: Secondary | ICD-10-CM | POA: Diagnosis not present

## 2017-07-03 DIAGNOSIS — K599 Functional intestinal disorder, unspecified: Secondary | ICD-10-CM

## 2017-07-03 DIAGNOSIS — K9289 Other specified diseases of the digestive system: Secondary | ICD-10-CM

## 2017-07-03 DIAGNOSIS — Z1231 Encounter for screening mammogram for malignant neoplasm of breast: Secondary | ICD-10-CM | POA: Diagnosis not present

## 2017-07-03 DIAGNOSIS — F332 Major depressive disorder, recurrent severe without psychotic features: Secondary | ICD-10-CM

## 2017-07-03 NOTE — Patient Instructions (Signed)

## 2017-07-03 NOTE — Progress Notes (Signed)
Date:  07/03/2017   Name:  Judy Gibbs   DOB:  01-31-1966   MRN:  [id number]   Chief Complaint: Annual Exam (Breast Exam. ) Judy Gibbs is a 52 y.o. female who presents today for her Complete Annual Exam. She feels poorly. She reports exercising none. She reports she is sleeping poorly due to pain from abdominal issues.  She is s/p hysterectomy.  Her mammogram is up to date.  She denies any breast issues.  She is still dealing with chronic abdominal pain, reflux and fatigue. She continues to see GI.  Several procedures are planned, including possible surgery - fundiplication.  Review of Systems  Constitutional: Positive for fatigue. Negative for chills, fever and unexpected weight change.  HENT: Negative for hearing loss, sore throat and trouble swallowing.   Respiratory: Negative for cough, chest tightness, shortness of breath and wheezing.   Cardiovascular: Negative for chest pain, palpitations and leg swelling.  Gastrointestinal: Positive for abdominal pain and nausea.  Genitourinary: Negative for dysuria, frequency and hematuria.  Musculoskeletal: Negative for arthralgias, back pain and gait problem.  Skin: Negative for color change and rash.  Neurological: Negative for dizziness and tremors.  Psychiatric/Behavioral: Positive for dysphoric mood and sleep disturbance.    Patient Active Problem List   Diagnosis Date Noted  . Encounter for venous access device care 10/23/2016  . Vitamin D deficiency 09/26/2016  . Chronic renal insufficiency, stage 1 09/26/2016  . Iron deficiency anemia secondary to inadequate dietary iron intake 01/28/2016  . PD (perceptive deafness), asymmetrical 07/26/2015  . Fibromyalgia 07/07/2015  . Ileostomy in place [facility]) 07/07/2015  . Gastrointestinal dysmotility 07/07/2015  . 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 Chronic pain syndrome 07/05/2015  . Parastomal hernia without obstruction or gangrene  05/05/2015  . Hypertensive lower esophageal sphincter 01/03/2015  . Anxiety disorder, unspecified 03/14/2014  . Dysautonomia (HCC) 09/01/2011  . Interstitial cystitis 03/24/2011  . GERD 10/28/2009  . 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 Authorizing Provider  acetaminophen (TYLENOL) 500 MG tablet Take 500 mg by mouth every 6 (six) hours as needed.   Yes [provider]  dicyclomine (BENTYL) 20 MG tablet Take 20 mg by mouth 3 (three) times daily. As needed 09/08/16  Yes [provider]  dronabinol (MARINOL) 5 MG capsule Take 5 mg by mouth 2 (two) times daily before a meal.   Yes [provider]  glucose blood (ACCU-CHEK AVIVA PLUS) test strip  06/03/12  Yes [provider]  Ibuprofen (ADVIL MIGRAINE PO) Take by mouth.   Yes [provider]  isosorbide dinitrate (ISORDIL) 10 MG tablet Take 10 mg by mouth 3 (three) times daily.   Yes [provider]  Multiple Vitamin (MULTI-VITAMIN DAILY PO) Take by mouth.   Yes [provider]  omeprazole (PRILOSEC) 40 MG capsule Take 40 mg by mouth 2 (two) times daily.   Yes [provider]  promethazine (PHENERGAN) 6.25 MG/5ML syrup Take by mouth every 6 (six) hours as needed for nausea or vomiting.   Yes [provider]  simethicone (MYLICON) 125 MG chewable tablet Chew 125 mg by mouth every 6 (six) hours as needed for flatulence.   Yes [provider]  sucralfate (CARAFATE) 1 GM/10ML suspension Take 10 mLs (1 g total) by mouth 2 (two) times daily. 07/06/15  Yes Altamese Dilling, MD  traZODone (DESYREL) 150 MG tablet TAKE 1  TABLET(150 MG) BY MOUTH AT BEDTIME 06/21/17  Yes Reubin MilanBerglund, Layla Gramm H, MD    Allergies  Allergen Reactions  . Celecoxib Hives  . Escitalopram Oxalate Diarrhea  . Hydromorphone Hcl Other (See Comments)    Skakes  . Piperacillin-Tazobactam In Dex Other (See Comments)    Acute  interstitial nephritis (creatinine increasing with urine eosinophilia). Acute interstitial nephritis (creatinine increasing with urine eosinophilia).  . Prozac [Fluoxetine Hcl] Other (See Comments)    Nervous  . Tape Dermatitis    Paper tape  . Ciprofloxacin Rash  . Reglan [Metoclopramide] Anxiety    Past Surgical History:  Procedure Laterality Date  . ABDOMINAL HYSTERECTOMY  2001   has one ovary  . CELIAC PLEXUS BLOCK  11/2016   failed to provide sx relief  . CHOLECYSTECTOMY    . COLONOSCOPY  08/08/2001   normal, Dr. Lina Sarora Brodie  . DIVERTING ILEOSTOMY  2013  . EDG  06/28/1987   Dr. Matthias HughsBuccini, bile in stomach  . ESOPHAGOGASTRODUODENOSCOPY  12/20/1993   mild antral gastritis, Dr. Lina Sarora Brodie  . ILEOSTOMY REVISION    . removal of internal jugular vein port Right 03/07/2017  . TOTAL KNEE ARTHROPLASTY  89  & 10   R & L  . UPPER GASTROINTESTINAL ENDOSCOPY  06/15/2006   hiatal hernia, gastritis  . UPPER GASTROINTESTINAL ENDOSCOPY  07/04/2001   Dr. Lina Sarora Brodie    Social History   Tobacco Use  . Smoking status: Former Smoker    Last attempt to quit: 05/22/1982    Years since quitting: 35.1  . Smokeless tobacco: Never Used  Substance Use Topics  . Alcohol use: No    Alcohol/week: 0.0 oz  . Drug use: No     Medication list has been reviewed and updated.  PHQ 2/9 Scores 02/06/2013  PHQ - 2 Score 0    Physical Exam  Constitutional: She is oriented to person, place, and time. She appears well-developed. No distress.  HENT:  Head: Normocephalic and atraumatic.  Cardiovascular: Normal rate, regular rhythm and normal heart sounds.  Pulmonary/Chest: Effort normal and breath sounds normal. No respiratory distress. She has no wheezes. She has no rales.  Abdominal: Soft. There is tenderness. There is guarding. There is no rigidity.    Musculoskeletal: Normal range of motion.  Neurological: She is alert and oriented to person, place, and time.  Skin: Skin is warm and dry. No rash  noted.  Psychiatric: She has a normal mood and affect. Her speech is normal and behavior is normal. Thought content normal.  Nursing note and vitals reviewed.   BP 108/68   Pulse 66   Ht 5\' 6"  (1.676 m)   Wt 245 lb (111.1 kg)   BMI 39.54 kg/m   Assessment and Plan: 1. Annual physical exam Pap is no longer needed  2. Breast cancer screening Due this summer - MM DIGITAL SCREENING BILATERAL; Future  3. Ileostomy in place [facility](HCC) Functioning well  4. Dysautonomia (HCC) Stable  5. Major depressive disorder, recurrent severe without psychotic features (HCC) Stable on trazodone  6. Gastrointestinal dysmotility Followed by GI  7. Colonic inertia S/p colostomy   No orders of the defined types were placed in this encounter.   Partially dictated using Animal nutritionistDragon software. Any errors are unintentional.  Bari EdwardLaura Kasara Schomer, MD [facility]  07/03/2017

## 2017-08-28 ENCOUNTER — Encounter: Payer: Self-pay | Admitting: Internal Medicine

## 2017-08-29 NOTE — Telephone Encounter (Signed)
Patient message. Please Advise.

## 2017-09-20 ENCOUNTER — Ambulatory Visit (INDEPENDENT_AMBULATORY_CARE_PROVIDER_SITE_OTHER): Payer: Medicare Other | Admitting: Internal Medicine

## 2017-09-20 ENCOUNTER — Encounter: Payer: Self-pay | Admitting: Internal Medicine

## 2017-09-20 VITALS — BP 115/79 | HR 89 | Temp 98.0°F | Resp 16 | Ht 69.0 in | Wt 245.0 lb

## 2017-09-20 DIAGNOSIS — R197 Diarrhea, unspecified: Secondary | ICD-10-CM | POA: Diagnosis not present

## 2017-09-20 DIAGNOSIS — K9289 Other specified diseases of the digestive system: Secondary | ICD-10-CM

## 2017-09-20 NOTE — Progress Notes (Signed)
Date:  09/20/2017   Name:  Judy Gibbs   DOB:  May 02, 1966   MRN:  [id number]   Chief Complaint: Diarrhea (Seen in ER at [facility] April two weeks ago. Will see Adriana Simas at [facility] 5/15 Ileostomy passed large amounts of stool and was told by [facility] Nurse to see PCP rather than wait on GI to mail the HAT to collect stool samples and Victorino Dike at [facility] also said that we need to do the stool tests on this to see what is happenig to cause it. )  She has a diverting ileostomy and rarely has stool from her rectum.  However, 3 weeks ago had bad colon spasms and passed thin, formed stool for several days from her rectum.  She sent pics to her GI who thought it was just mucus but she is not sure.  She now feels like she may have another bout of this and GI recommended that she get a hat to collect a specimen.  They did not however, recommend what tests to be done once the stool was collected.  Review of Systems  Constitutional: Positive for chills. Negative for diaphoresis and fever.  Respiratory: Negative for cough, chest tightness, shortness of breath and wheezing.   Cardiovascular: Negative for chest pain.  Gastrointestinal: Positive for abdominal pain and rectal pain. Negative for blood in stool.    Patient Active Problem List   Diagnosis Date Noted  . Encounter for venous access device care 10/23/2016  . Vitamin D deficiency 09/26/2016  . Chronic renal insufficiency, stage 1 09/26/2016  . Iron deficiency anemia secondary to inadequate dietary iron intake 01/28/2016  . PD (perceptive deafness), asymmetrical 07/26/2015  . Fibromyalgia 07/07/2015  . Ileostomy in place [facility]) 07/07/2015  . Gastrointestinal dysmotility 07/07/2015  . 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 Chronic pain syndrome 07/05/2015  . Parastomal hernia without obstruction or gangrene 05/05/2015  . Hypertensive lower esophageal sphincter 01/03/2015  . Anxiety disorder, unspecified 03/14/2014   . Dysautonomia (HCC) 09/01/2011  . Interstitial cystitis 03/24/2011  . GERD 10/28/2009  . 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 Authorizing Provider  acetaminophen (TYLENOL) 500 MG tablet Take 500 mg by mouth every 6 (six) hours as needed.    [provider]  dicyclomine (BENTYL) 20 MG tablet Take 20 mg by mouth 3 (three) times daily. As needed 09/08/16   [provider]  dronabinol (MARINOL) 5 MG capsule Take 5 mg by mouth 2 (two) times daily before a meal.    [provider]  glucose blood (ACCU-CHEK AVIVA PLUS) test strip  06/03/12   [provider]  Ibuprofen (ADVIL MIGRAINE PO) Take by mouth.    [provider]  isosorbide dinitrate (ISORDIL) 10 MG tablet Take 10 mg by mouth 3 (three) times daily.    [provider]  Multiple Vitamin (MULTI-VITAMIN DAILY PO) Take by mouth.    [provider]  omeprazole (PRILOSEC) 40 MG capsule Take 40 mg by mouth 2 (two) times daily.    [provider]  promethazine (PHENERGAN) 6.25 MG/5ML syrup Take by mouth every 6 (six) hours as needed for nausea or vomiting.    [provider]  simethicone (MYLICON) 125 MG chewable tablet Chew 125 mg by mouth every 6 (six) hours as needed for flatulence.    [provider]  sucralfate (CARAFATE) 1 GM/10ML suspension Take 10 mLs (1 g  total) by mouth 2 (two) times daily. 07/06/15   Altamese Dilling, MD  traZODone (DESYREL) 150 MG tablet TAKE 1 TABLET(150 MG) BY MOUTH AT BEDTIME 06/21/17   Reubin Milan, MD    Allergies  Allergen Reactions  . Celecoxib Hives  . Escitalopram Oxalate Diarrhea  . Hydromorphone Hcl Other (See Comments)    Skakes  . Piperacillin-Tazobactam In Dex Other (See Comments)    Acute interstitial nephritis (creatinine increasing with urine eosinophilia). Acute interstitial nephritis (creatinine increasing with urine  eosinophilia).  . Prozac [Fluoxetine Hcl] Other (See Comments)    Nervous  . Tape Dermatitis    Paper tape  . Ciprofloxacin Rash  . Reglan [Metoclopramide] Anxiety    Past Surgical History:  Procedure Laterality Date  . ABDOMINAL HYSTERECTOMY  2001   has one ovary  . CELIAC PLEXUS BLOCK  11/2016   failed to provide sx relief  . CHOLECYSTECTOMY    . COLONOSCOPY  08/08/2001   normal, Dr. Lina Sar  . DIVERTING ILEOSTOMY  2013  . EDG  06/28/1987   Dr. Matthias Hughs, bile in stomach  . ESOPHAGOGASTRODUODENOSCOPY  12/20/1993   mild antral gastritis, Dr. Lina Sar  . ILEOSTOMY REVISION    . removal of internal jugular vein port Right 03/07/2017  . TOTAL KNEE ARTHROPLASTY  89  & 10   R & L  . UPPER GASTROINTESTINAL ENDOSCOPY  06/15/2006   hiatal hernia, gastritis  . UPPER GASTROINTESTINAL ENDOSCOPY  07/04/2001   Dr. Lina Sar    Social History   Tobacco Use  . Smoking status: Former Smoker    Last attempt to quit: 05/22/1982    Years since quitting: 35.3  . Smokeless tobacco: Never Used  Substance Use Topics  . Alcohol use: No    Alcohol/week: 0.0 oz  . Drug use: No     Medication list has been reviewed and updated.  PHQ 2/9 Scores 02/06/2013  PHQ - 2 Score 0    Physical Exam  Constitutional: She is oriented to person, place, and time. She appears well-developed. No distress.  HENT:  Head: Normocephalic and atraumatic.  Neck: Normal range of motion. Neck supple.  Cardiovascular: Normal rate, regular rhythm and normal heart sounds.  Pulmonary/Chest: Effort normal and breath sounds normal. No respiratory distress.  Abdominal: Bowel sounds are normal.  Musculoskeletal: Normal range of motion.  Neurological: She is alert and oriented to person, place, and time.  Skin: Skin is warm and dry. No rash noted.  Psychiatric: She has a normal mood and affect. Her behavior is normal. Thought content normal.    BP 115/79   Pulse 89   Temp 98 F (36.7 C)   Resp 16   Ht   (1.753 m)   Wt 245 lb (111.1 kg)   SpO2 99%   BMI 36.18 kg/m   Assessment and Plan: 1. Diarrhea, unspecified type Will give hat to rectal discharge if it recurs and will go ahead and order some routine tests while she waits for GI appt - Stool, WBC/Lactoferrin - Cdiff NAA+O+P+Stool Culture  2. Gastrointestinal dysmotility Recommended by GI to go to an all liquid diet   No orders of the defined types were placed in this encounter.   Partially dictated using Animal nutritionist. Any errors are unintentional.  Bari Edward, MD [facility]  09/20/2017

## 2017-11-11 ENCOUNTER — Encounter: Payer: Self-pay | Admitting: Internal Medicine

## 2017-11-13 DIAGNOSIS — K5289 Other specified noninfective gastroenteritis and colitis: Secondary | ICD-10-CM | POA: Insufficient documentation

## 2017-11-14 ENCOUNTER — Encounter: Payer: Self-pay | Admitting: Internal Medicine

## 2017-11-16 ENCOUNTER — Encounter: Payer: Self-pay | Admitting: Internal Medicine

## 2017-12-25 ENCOUNTER — Encounter: Payer: Self-pay | Admitting: Internal Medicine

## 2018-01-02 ENCOUNTER — Ambulatory Visit: Payer: Self-pay | Admitting: Internal Medicine

## 2018-01-09 ENCOUNTER — Other Ambulatory Visit: Payer: Self-pay | Admitting: Internal Medicine

## 2018-01-09 ENCOUNTER — Encounter: Payer: Self-pay | Admitting: Internal Medicine

## 2018-01-09 ENCOUNTER — Ambulatory Visit (INDEPENDENT_AMBULATORY_CARE_PROVIDER_SITE_OTHER): Payer: Medicare Other | Admitting: Internal Medicine

## 2018-01-09 VITALS — BP 110/76 | HR 97 | Ht 69.0 in | Wt 233.0 lb

## 2018-01-09 DIAGNOSIS — R9431 Abnormal electrocardiogram [ECG] [EKG]: Secondary | ICD-10-CM

## 2018-01-09 DIAGNOSIS — K9289 Other specified diseases of the digestive system: Secondary | ICD-10-CM | POA: Diagnosis not present

## 2018-01-09 NOTE — Progress Notes (Signed)
Date:  01/09/2018   Name:  Judy Gibbs   DOB:  July 12, 1965   MRN:  [id number]   Chief Complaint: ER follow up (Following up from chest pains at ER. No new episodes since hospital. Doing better.)  Chest pain - seen in ED with high BP and chest pain.  Work up was negative.  Labs were normal. She thinks it was esophageal spasm.  She was seen by GI MD last week.  She is going to see a pain specialist but no change in medication.  She normally takes Isordil but did not have with her when the episode occurred. She has only had mild recurrent sx.  She is avoiding triggers and using isordil as needed. She was concerned about the EKG report that mentioned possible LAE. Review of EKG from 2017 was identical. ECHO in 2017 showed normal chamber sizes in all four chambers.  Review of Systems  Constitutional: Negative for chills, fatigue and fever.  Eyes: Negative for visual disturbance.  Respiratory: Negative for chest tightness, shortness of breath and wheezing.   Cardiovascular: Negative for chest pain, palpitations and leg swelling.  Neurological: Negative for dizziness and headaches.  Psychiatric/Behavioral: Negative for sleep disturbance.    Patient Active Problem List   Diagnosis Date Noted  . 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 Osteonecrosis (HCC) 05/31/2017  . Arthritis of left knee 01/17/2017  . Encounter for venous access device care 10/23/2016  . Vitamin D deficiency 09/26/2016  . Chronic renal insufficiency, stage 1 09/26/2016  . Iron deficiency anemia secondary to inadequate dietary iron intake 01/28/2016  . PD (perceptive deafness), asymmetrical 07/26/2015  . Fibromyalgia 07/07/2015  . Ileostomy in place [facility](HCC) 07/07/2015  . Gastrointestinal dysmotility 07/07/2015  . 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 Chronic pain syndrome 07/05/2015  . Parastomal hernia without obstruction or gangrene 05/05/2015  . Hypertensive lower esophageal  sphincter 01/03/2015  . Protein malnutrition (HCC) 03/16/2014  . Anxiety disorder, unspecified 03/14/2014  . Dysautonomia (HCC) 09/01/2011  . Interstitial cystitis 03/24/2011  . GERD 10/28/2009  . 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 Celecoxib Hives  . Escitalopram Oxalate Diarrhea  . Hydromorphone Hcl Other (See Comments)    Skakes  . Piperacillin-Tazobactam In Dex Other (See Comments)    Acute interstitial nephritis (creatinine increasing with urine eosinophilia). Acute interstitial nephritis (creatinine increasing with urine eosinophilia).  . Prozac [Fluoxetine Hcl] Other (See Comments)    Nervous  . Tape Dermatitis    Paper tape  . Ciprofloxacin Rash  . Reglan [Metoclopramide] Anxiety    Past Surgical History:  Procedure Laterality Date  . ABDOMINAL HYSTERECTOMY  2001   has one ovary  . CELIAC PLEXUS BLOCK  11/2016   failed to provide sx relief  . CHOLECYSTECTOMY    . COLONOSCOPY  08/08/2001   normal, Dr. Lina Sarora Brodie  . DIVERTING ILEOSTOMY  2013  . EDG  06/28/1987   Dr. Matthias HughsBuccini, bile in stomach  . ESOPHAGOGASTRODUODENOSCOPY  12/20/1993   mild antral gastritis, Dr. Lina Sarora Brodie  . ILEOSTOMY REVISION    . removal of internal jugular vein port Right 03/07/2017  . TOTAL KNEE ARTHROPLASTY  89  & 10   R & L  . UPPER GASTROINTESTINAL ENDOSCOPY  06/15/2006   hiatal hernia, gastritis  . UPPER GASTROINTESTINAL ENDOSCOPY  07/04/2001   Dr. Lina Sarora Brodie    Social History   Tobacco Use  . Smoking status: Former Smoker    Last  attempt to quit: 05/22/1982    Years since quitting: 35.6  . Smokeless tobacco: Never Used  Substance Use Topics  . Alcohol use: No    Alcohol/week: 0.0 standard drinks  . Drug use: No     Medication list has been reviewed and updated.  Current Meds  Medication Sig  . acetaminophen (TYLENOL) 500 MG tablet Take 500 mg by mouth every 6 (six) hours as needed.  Marland Kitchen. amitriptyline (ELAVIL) 50 MG tablet Take 1 tablet by mouth at  bedtime.  . bisacodyl (DULCOLAX) 5 MG EC tablet Take 5 mg by mouth daily as needed for moderate constipation.  Marland Kitchen. glucose blood (ACCU-CHEK AVIVA PLUS) test strip   . Ibuprofen (ADVIL MIGRAINE PO) Take by mouth.  . isosorbide dinitrate (ISORDIL) 10 MG tablet Take 10 mg by mouth 3 (three) times daily.  . mesalamine (ROWASA) 4 g enema INSERT 1 ENEMA RECTALLY HS  . Multiple Vitamin (MULTI-VITAMIN DAILY PO) Take by mouth.  . promethazine (PHENERGAN) 6.25 MG/5ML syrup Take by mouth every 6 (six) hours as needed for nausea or vomiting.  . ranitidine (ZANTAC) 300 MG tablet Take by mouth daily.  . simethicone (MYLICON) 125 MG chewable tablet Chew 125 mg by mouth every 6 (six) hours as needed for flatulence.  . sucralfate (CARAFATE) 1 GM/10ML suspension Take 10 mLs (1 g total) by mouth 2 (two) times daily.    PHQ 2/9 Scores 01/09/2018 02/06/2013  PHQ - 2 Score 0 0  PHQ- 9 Score 0 -    Physical Exam  Constitutional: She is oriented to person, place, and time. She appears well-developed. No distress.  HENT:  Head: Normocephalic and atraumatic.  Neck: Normal range of motion. Neck supple.  Cardiovascular: Normal rate, regular rhythm and normal heart sounds.  Pulmonary/Chest: Effort normal and breath sounds normal. No respiratory distress.  Musculoskeletal: She exhibits no edema or tenderness.  Neurological: She is alert and oriented to person, place, and time.  Skin: Skin is warm and dry. No rash noted.  Psychiatric: She has a normal mood and affect. Her behavior is normal. Thought content normal.  Nursing note and vitals reviewed.   BP 110/76 (BP Location: Right Arm, Patient Position: Sitting, Cuff Size: Normal)   Pulse 97   Ht 5\' 9"  (1.753 m)   Wt 233 lb (105.7 kg)   SpO2 98%   BMI 34.41 kg/m   Assessment and Plan: 1. Gastrointestinal dysmotility With spasm and atypical chest pain Resolved with negative cardiac workup  2. EKG abnormality Appears to be chronic and without confirmation  of LAE by ECHO 2017   No orders of the defined types were placed in this encounter.   Partially dictated using Animal nutritionistDragon software. Any errors are unintentional.  Bari EdwardLaura Jeralyn Nolden, MD [facility]  01/09/2018   There are no diagnoses linked to this encounter.

## 2018-01-27 ENCOUNTER — Other Ambulatory Visit: Payer: Self-pay | Admitting: Internal Medicine

## 2018-01-28 ENCOUNTER — Encounter: Payer: Self-pay | Admitting: Internal Medicine

## 2018-01-28 ENCOUNTER — Other Ambulatory Visit: Payer: Self-pay | Admitting: Internal Medicine

## 2018-01-28 MED ORDER — TRAZODONE HCL 150 MG PO TABS: 150.0000 mg | ORAL_TABLET | Freq: Every day | ORAL | 5 refills | Status: DC

## 2018-01-28 NOTE — Telephone Encounter (Signed)
Please advise pt message regarding refill on medication.  Thank you.

## 2018-02-13 DIAGNOSIS — M170 Bilateral primary osteoarthritis of knee: Secondary | ICD-10-CM | POA: Insufficient documentation

## 2018-02-26 ENCOUNTER — Encounter: Payer: Self-pay | Admitting: Internal Medicine

## 2018-02-26 DIAGNOSIS — M7062 Trochanteric bursitis, left hip: Secondary | ICD-10-CM | POA: Insufficient documentation

## 2018-03-07 DIAGNOSIS — G4730 Sleep apnea, unspecified: Secondary | ICD-10-CM | POA: Insufficient documentation

## 2018-03-08 LAB — VITAMIN D 25 HYDROXY (VIT D DEFICIENCY, FRACTURES): Vit D, 25-Hydroxy: 25

## 2018-03-12 ENCOUNTER — Encounter: Payer: Self-pay | Admitting: Internal Medicine

## 2018-05-09 DIAGNOSIS — M5417 Radiculopathy, lumbosacral region: Secondary | ICD-10-CM | POA: Insufficient documentation

## 2018-07-14 LAB — BASIC METABOLIC PANEL
BUN: 9 (ref 4–21)
CREATININE: 0.9 (ref 0.5–1.1)
GLUCOSE: 95
POTASSIUM: 3.9 (ref 3.4–5.3)
SODIUM: 135 — AB (ref 137–147)

## 2018-07-14 LAB — CBC AND DIFFERENTIAL
Hemoglobin: 13.7 (ref 12.0–16.0)
Platelets: 341 (ref 150–399)
WBC: 6

## 2018-07-14 LAB — HEPATIC FUNCTION PANEL
ALT: 22 (ref 7–35)
AST: 24 (ref 13–35)
Alkaline Phosphatase: 85 (ref 25–125)
Bilirubin, Total: 0.2

## 2018-07-14 LAB — HEMOGLOBIN A1C: Hemoglobin A1C: 5.4

## 2018-07-22 DIAGNOSIS — Z96652 Presence of left artificial knee joint: Secondary | ICD-10-CM | POA: Insufficient documentation

## 2018-07-31 ENCOUNTER — Other Ambulatory Visit: Payer: Self-pay | Admitting: Internal Medicine

## 2018-08-07 ENCOUNTER — Ambulatory Visit (INDEPENDENT_AMBULATORY_CARE_PROVIDER_SITE_OTHER): Payer: Medicare Other

## 2018-08-07 ENCOUNTER — Ambulatory Visit: Payer: Self-pay

## 2018-08-07 ENCOUNTER — Other Ambulatory Visit: Payer: Self-pay

## 2018-08-07 VITALS — BP 118/82 | HR 112 | Temp 97.6°F | Resp 16 | Ht 69.0 in | Wt 223.4 lb

## 2018-08-07 DIAGNOSIS — Z1231 Encounter for screening mammogram for malignant neoplasm of breast: Secondary | ICD-10-CM

## 2018-08-07 DIAGNOSIS — Z0000 Encounter for general adult medical examination without abnormal findings: Secondary | ICD-10-CM | POA: Diagnosis not present

## 2018-08-07 NOTE — Progress Notes (Signed)
Subjective:   Judy Gibbs is a 53 y.o. female who presents for an Initial Medicare Annual Wellness Visit.  Review of Systems     Cardiac Risk Factors include: obesity (BMI >30kg/m2)     Objective:    Today's Vitals   08/07/18 1122 08/07/18 1129  BP: 118/82   Pulse: (!) 112   Resp: 16   Temp: 97.6 F (36.4 C)   TempSrc: Oral   SpO2: 98%   Weight: 223 lb 6.4 oz (101.3 kg)   Height:  (1.753 m)   PainSc:  4    Body mass index is 32.99 kg/m.  Advanced Directives 08/07/2018 09/26/2016 07/04/2015 07/04/2015  Does Patient Have a Medical Advance Directive? Yes Yes Yes No  Type of Estate agent of [place];Living will Living will Healthcare Power of [place];Living will -  Does patient want to make changes to medical advance directive? - - No - Patient declined -  Copy of Healthcare Power of Attorney in Chart? Yes - validated most recent copy scanned in chart (See row information) - No - copy requested -  Would patient like information on creating a medical advance directive? - - No - patient declined information No - patient declined information  Some encounter information is confidential and restricted. Go to Review Flowsheets activity to see all data.    Current Medications (verified) Outpatient Encounter Medications as of 08/07/2018  Medication Sig  . acetaminophen (TYLENOL) 500 MG tablet Take 500 mg by mouth every 6 (six) hours as needed.  . bisacodyl (DULCOLAX) 5 MG EC tablet Take 5 mg by mouth daily as needed for moderate constipation.  . butalbital-acetaminophen-caffeine (FIORICET, ESGIC) 50-325-40 MG tablet TK 1 T PO ONCE D PRF HA. NO MORE THAN 2 DOSES PER WK  . diclofenac (VOLTAREN) 75 MG EC tablet Take 1 tablet by mouth 2 (two) times daily as needed.  . diclofenac sodium (VOLTAREN) 1 % GEL APP 2 GRAMS EXT AA QID  . dicyclomine (BENTYL) 20 MG tablet TAKE 1 TABLET(20 MG) BY MOUTH THREE TIMES DAILY  . dronabinol (MARINOL) 5 MG capsule TK 1 C PO  BID PRN  . gabapentin (NEURONTIN) 300 MG capsule Take 2 capsules by mouth at bedtime as needed.  Marland Kitchen HYDROcodone-acetaminophen (NORCO) 7.5-325 MG tablet TK 1 T PO Q 4 H FOR UP TO 5 DAYS PRF PAIN  . Ibuprofen (ADVIL MIGRAINE PO) Take by mouth.  . isosorbide dinitrate (ISORDIL) 10 MG tablet Take 10 mg by mouth 3 (three) times daily.  . mesalamine (ROWASA) 4 g enema INSERT 1 ENEMA RECTALLY HS  . Multiple Vitamin (MULTI-VITAMIN DAILY PO) Take by mouth.  . nortriptyline (PAMELOR) 10 MG capsule Take 2 capsules by mouth at bedtime.  Marland Kitchen omeprazole (PRILOSEC) 40 MG capsule Take 1 capsule by mouth 2 (two) times daily.  . ondansetron (ZOFRAN) 8 MG tablet Take 1 tablet by mouth 3 (three) times daily as needed.  . simethicone (MYLICON) 125 MG chewable tablet Chew 125 mg by mouth every 6 (six) hours as needed for flatulence.  . sucralfate (CARAFATE) 1 GM/10ML suspension Take 10 mLs (1 g total) by mouth 2 (two) times daily.  . traZODone (DESYREL) 150 MG tablet TAKE 1 TABLET(150 MG) BY MOUTH AT BEDTIME  . [DISCONTINUED] glucose blood (ACCU-CHEK AVIVA PLUS) test strip   . [DISCONTINUED] HYDROcodone-acetaminophen (NORCO/VICODIN) 5-325 MG tablet TK 1 T PO Q 6 H PRF PAIN  . [DISCONTINUED] promethazine (PHENERGAN) 6.25 MG/5ML syrup Take by mouth every 6 (six) hours  as needed for nausea or vomiting.  . [DISCONTINUED] ranitidine (ZANTAC) 300 MG tablet Take by mouth daily.   No facility-administered encounter medications on file as of 08/07/2018.     Allergies (verified) Celecoxib; Escitalopram oxalate; Estradiol; Hydromorphone hcl; Penicillins; Piperacillin-tazobactam in dex; Prozac [fluoxetine hcl]; Tape; Vancomycin; Ciprofloxacin; and Reglan [metoclopramide]   History: Past Medical History:  Diagnosis Date  . Anxiety   . Colonic inertia   . DDD (degenerative disc disease), cervical   . Depression   . Fibromyalgia   . Gastroparesis   . GERD (gastroesophageal reflux disease)   . Hypoglycemia   . Interstitial  cystitis   . Obesity   . Pelvic floor dysfunction   . POTS (postural orthostatic tachycardia syndrome)   . Proctalgia   . Raynauds phenomenon   . Vertigo    Past Surgical History:  Procedure Laterality Date  . ABDOMINAL HYSTERECTOMY  2001   has one ovary  . CELIAC PLEXUS BLOCK  11/2016   failed to provide sx relief  . CHOLECYSTECTOMY    . COLONOSCOPY  08/08/2001   normal, Dr. Lina Sar  . DIVERTING ILEOSTOMY  2013  . EDG  06/28/1987   Dr. Matthias Hughs, bile in stomach  . ESOPHAGOGASTRODUODENOSCOPY  12/20/1993   mild antral gastritis, Dr. Lina Sar  . ILEOSTOMY REVISION    . removal of internal jugular vein port Right 03/07/2017  . TOTAL KNEE ARTHROPLASTY  89  & 10   R & L  . UPPER GASTROINTESTINAL ENDOSCOPY  06/15/2006   hiatal hernia, gastritis  . UPPER GASTROINTESTINAL ENDOSCOPY  07/04/2001   Dr. Lina Sar   Family History  Problem Relation Age of Onset  . Colon polyps Paternal Uncle   . Esophageal cancer Paternal Uncle   . Diabetes Father   . Heart disease Father   . Stroke Father   . Hyperlipidemia Father   . Kidney failure Father   . Hyperlipidemia Mother   . Dementia Mother   . Cancer Paternal Aunt        breast  . Breast cancer Paternal Aunt 16  . GI Disease Daughter        mass cell disorder  . Joint hypermobility Daughter   . Early death Neg Hx    Social History   Socioeconomic History  . Marital status: Married    Spouse name: Not on file  . Number of children: 2  . Years of education: Not on file  . Highest education level: Associate degree: occupational, Scientist, product/process development, or vocational program  Occupational History  . Occupation: homemaker  Social Needs  . Financial resource strain: Not hard at all  . Food insecurity:    Worry: Never true    Inability: Never true  . Transportation needs:    Medical: No    Non-medical: No  Tobacco Use  . Smoking status: Former Smoker    Last attempt to quit: 05/22/1982    Years since quitting: 36.2  . Smokeless  tobacco: Never Used  Substance and Sexual Activity  . Alcohol use: No    Alcohol/week: 0.0 standard drinks  . Drug use: No  . Sexual activity: Yes    Birth control/protection: Surgical  Lifestyle  . Physical activity:    Days per week: 7 days    Minutes per session: 60 min  . Stress: Rather much  Relationships  . Social connections:    Talks on phone: More than three times a week    Gets together: More than three times a  week    Attends religious service: More than 4 times per year    Active member of club or organization: No    Attends meetings of clubs or organizations: Never    Relationship status: Married  Other Topics Concern  . Not on file  Social History Narrative  . Not on file    Tobacco Counseling Counseling given: Not Answered   Clinical Intake:  Pre-visit preparation completed: Yes  Pain : 0-10 Pain Score: 4  Pain Type: Chronic pain Pain Location: Knee Pain Orientation: Left Pain Descriptors / Indicators: Aching, Sore Pain Onset: More than a month ago Pain Frequency: Constant     BMI - recorded: 32.99 Nutritional Status: BMI > 30  Obese Nutritional Risks: Nausea/ vomitting/ diarrhea(nausea daily) Diabetes: No  How often do you need to have someone help you when you read instructions, pamphlets, or other written materials from your doctor or pharmacy?: 1 - Never  Interpreter Needed?: No  Information entered by :: Reather Littler LPN   Activities of Daily Living In your present state of health, do you have any difficulty performing the following activities: 08/07/2018 01/09/2018  Hearing? Y N  Comment declines hearing aids, seeing ear specialist -  Vision? Y N  Comment needs eye exam, reading glasses -  Difficulty concentrating or making decisions? N N  Walking or climbing stairs? Y N  Dressing or bathing? N N  Doing errands, shopping? N N  Preparing Food and eating ? N -  Using the Toilet? N -  In the past six months, have you accidently  leaked urine? N -  Do you have problems with loss of bowel control? N -  Managing your Medications? N -  Managing your Finances? N -  Housekeeping or managing your Housekeeping? N -  Some recent data might be hidden     Immunizations and Health Maintenance Immunization History  Administered Date(s) Administered  . Influenza,inj,Quad PF,6+ Mos 02/06/2013, 02/04/2015, 02/18/2016  . Tdap 03/22/2014   Health Maintenance Due  Topic Date Due  . HIV Screening  07/05/1979    Patient Care Team: Reubin Milan, MD as PCP - General (Internal Medicine) Sofie Hartigan, MD as Referring Physician (Nephrology) Beverly Gust, MD as Referring Physician (Gastroenterology) Anabel Bene Huston Foley, MD as Referring Physician (Hematology and Oncology) Twana First, MD as Referring Physician (Otolaryngology) Donavan Burnet, MD as Referring Physician (Pain Medicine) Madelaine Bhat, MD as Referring Physician (Oncology) Lovie Macadamia, Mercer Pod, MD (Orthopedic Surgery) Danilkowicz, Angelica Chessman (Physician Assistant) Reggie Pile, MD as Consulting Physician (Psychiatry) Donnetta Hutching, MD as Referring Physician (Colon and Rectal Surgery) Naoma Diener, NP as Nurse Practitioner (Neurology) Louretta Parma, [place] Lidia Collum, MD (Inactive) (Internal Medicine) Spaulding, Cherene Altes, FNP (Rheumatology)  Indicate any recent Medical Services you may have received from other than [facility] providers in the past year (date may be approximate).     Assessment:   This is a routine wellness examination for Judy Gibbs.  Hearing/Vision screen Hearing Screening Comments: Seeing ear specialist due to hearing fluctuates from fluid behind her ears  Vision Screening Comments: Pt c/o poor vision and needs to set up an appointment.   Dietary issues and exercise activities discussed: Current Exercise Habits: Home exercise routine, Type of exercise: stretching;calisthenics, Time (Minutes): 60,  Frequency (Times/Week): 7, Weekly Exercise (Minutes/Week): 420, Intensity: Mild, Exercise limited by: orthopedic condition(s);neurologic condition(s)  Goals    . Weight (lb) < 200 lb (90.7 kg)     Pt wants to lose weight and  improve knee pain and not have to use cane. She would like to overall be healthier to take care of her children.       Depression Screen PHQ 2/9 Scores 08/07/2018 01/09/2018 02/06/2013  PHQ - 2 Score 0 0 0  PHQ- 9 Score - 0 -    Fall Risk Fall Risk  08/07/2018 02/06/2013  Falls in the past year? 0 No  Number falls in past yr: 0 -  Injury with Fall? 0 -  Risk for fall due to : Impaired balance/gait;Impaired mobility -  Follow up Falls prevention discussed -    FALL RISK PREVENTION PERTAINING TO THE HOME:  Any stairs in or around the home? Yes  If so, do they handrails? Yes   Home free of loose throw rugs in walkways, pet beds, electrical cords, etc? Yes  Adequate lighting in your home to reduce risk of falls? Yes   ASSISTIVE DEVICES UTILIZED TO PREVENT FALLS:  Life alert? No  Use of a cane, walker or w/c? Yes  Grab bars in the bathroom? Yes  Shower chair or bench in shower? Yes  Elevated toilet seat or a handicapped toilet? Yes   DME ORDERS:  DME order needed?  No   TIMED UP AND GO:  Was the test performed? Yes .  Length of time to ambulate 10 feet: 8 sec.   GAIT:  Appearance of gait: Gait slow, steady and with the use of an assistive device.   Education: Fall risk prevention has been discussed.  Intervention(s) required? No   Cognitive Function:     6CIT Screen 08/07/2018  What Year? 0 points  What month? 0 points  What time? 0 points  Count back from 20 0 points  Months in reverse 0 points  Repeat phrase 0 points  Total Score 0    Screening Tests Health Maintenance  Topic Date Due  . HIV Screening  07/05/1979  . INFLUENZA VACCINE  08/20/2018 (Originally 12/20/2017)  . MAMMOGRAM  10/25/2018  . COLONOSCOPY  07/27/2020  .  TETANUS/TDAP  03/22/2024  . PAP SMEAR-Modifier  Discontinued    Qualifies for Shingles Vaccine? Yes . Due for Shingrix. Education has been provided regarding the importance of this vaccine. Pt has been advised to call insurance company to determine out of pocket expense. Advised may also receive vaccine at local pharmacy or Health Dept. Verbalized acceptance and understanding.  Tdap: Up to date  Flu Vaccine: Due for Flu vaccine. Does the patient want to receive this vaccine today?  No . Education has been provided regarding the importance of this vaccine but still declined. Advised may receive this vaccine at local pharmacy or Health Dept. Aware to provide a copy of the vaccination record if obtained from local pharmacy or Health Dept. Verbalized acceptance and understanding.  Pneumococcal Vaccine: due age 53  Cancer Screenings:  Colorectal Screening: Felxible sigmoidoscopy Completed 2019. Repeat every year.; Followed by GI.  Mammogram: Completed 10/24/16. Repeat every year; . Ordered today. Pt provided with contact information and advised to call to schedule appt.   Bone Density: due age 81  Lung Cancer Screening: (Low Dose CT Chest recommended if Age 37-80 years, 30 pack-year currently smoking OR have quit w/in 15years.) does not qualify.   Additional Screening:  Hepatitis C Screening: does qualify; postponed  Vision Screening: Recommended annual ophthalmology exams for early detection of glaucoma and other disorders of the eye. Is the patient up to date with their annual eye exam?  No  Who is  the provider or what is the name of the office in which the pt attends annual eye exams? No established provider If pt is not established with a provider, would they like to be referred to a provider to establish care? No .   Dental Screening: Recommended annual dental exams for proper oral hygiene  Community Resource Referral:  CRR required this visit?  No      Plan:    I have  personally reviewed and addressed the Medicare Annual Wellness questionnaire and have noted the following in the patient's chart:  A. Medical and social history B. Use of alcohol, tobacco or illicit drugs  C. Current medications and supplements D. Functional ability and status E.  Nutritional status F.  Physical activity G. Advance directives H. List of other physicians I.  Hospitalizations, surgeries, and ER visits in previous 12 months J.  Vitals K. Screenings such as hearing and vision if needed, cognitive and depression L. Referrals and appointments   In addition, I have reviewed and discussed with patient certain preventive protocols, quality metrics, and best practice recommendations. A written personalized care plan for preventive services as well as general preventive health recommendations were provided to patient.   Signed,  Reather Littler, LPN Nurse Health Advisor   Nurse Notes: pt recovering well from left knee replacement surgery. She would like to have labwork done to check her thyroid. Advised patient to schedule appt with Dr. Judithann Graves for follow up.

## 2018-08-07 NOTE — Patient Instructions (Signed)
Judy Gibbs , Thank you for taking time to come for your Medicare Wellness Visit. I appreciate your ongoing commitment to your health goals. Please review the following plan we discussed and let me know if I can assist you in the future.   Screening recommendations/referrals: Colonoscopy: Flexible sigmoidoscopy done 2019. Continue follow up with GI Mammogram: done 10/24/16. Please call [phone] to schedule your mammogram.   Recommended yearly ophthalmology/optometry visit for glaucoma screening and checkup Recommended yearly dental visit for hygiene and checkup  Vaccinations: Influenza vaccine: postponed Pneumococcal vaccine: due age 48 Tdap vaccine: done 03/22/14 Shingles vaccine: Shingrix discussed. Please contact your pharmacy for coverage information.   Conditions/risks identified: Recommend continuing healthy eating and physical activity for desired weight loss.   Next appointment: Please follow up in one year for your Medicare Annual Wellness visit. '   Preventive Care 40-64 Years, Female Preventive care refers to lifestyle choices and visits with your health care provider that can promote health and wellness. What does preventive care include?  A yearly physical exam. This is also called an annual well check.  Dental exams once or twice a year.  Routine eye exams. Ask your health care provider how often you should have your eyes checked.  Personal lifestyle choices, including:  Daily care of your teeth and gums.  Regular physical activity.  Eating a healthy diet.  Avoiding tobacco and drug use.  Limiting alcohol use.  Practicing safe sex.  Taking low-dose aspirin daily starting at age 20.  Taking vitamin and mineral supplements as recommended by your health care provider. What happens during an annual well check? The services and screenings done by your health care provider during your annual well check will depend on your age, overall health, lifestyle risk  factors, and family history of disease. Counseling  Your health care provider may ask you questions about your:  Alcohol use.  Tobacco use.  Drug use.  Emotional well-being.  Home and relationship well-being.  Sexual activity.  Eating habits.  Work and work Statistician.  Method of birth control.  Menstrual cycle.  Pregnancy history. Screening  You may have the following tests or measurements:  Height, weight, and BMI.  Blood pressure.  Lipid and cholesterol levels. These may be checked every 5 years, or more frequently if you are over 72 years old.  Skin check.  Lung cancer screening. You may have this screening every year starting at age 52 if you have a 30-pack-year history of smoking and currently smoke or have quit within the past 15 years.  Fecal occult blood test (FOBT) of the stool. You may have this test every year starting at age 76.  Flexible sigmoidoscopy or colonoscopy. You may have a sigmoidoscopy every 5 years or a colonoscopy every 10 years starting at age 41.  Hepatitis C blood test.  Hepatitis B blood test.  Sexually transmitted disease (STD) testing.  Diabetes screening. This is done by checking your blood sugar (glucose) after you have not eaten for a while (fasting). You may have this done every 1-3 years.  Mammogram. This may be done every 1-2 years. Talk to your health care provider about when you should start having regular mammograms. This may depend on whether you have a family history of breast cancer.  BRCA-related cancer screening. This may be done if you have a family history of breast, ovarian, tubal, or peritoneal cancers.  Pelvic exam and Pap test. This may be done every 3 years starting at age 55. Starting at  age 3, this may be done every 5 years if you have a Pap test in combination with an HPV test.  Bone density scan. This is done to screen for osteoporosis. You may have this scan if you are at high risk for  osteoporosis. Discuss your test results, treatment options, and if necessary, the need for more tests with your health care provider. Vaccines  Your health care provider may recommend certain vaccines, such as:  Influenza vaccine. This is recommended every year.  Tetanus, diphtheria, and acellular pertussis (Tdap, Td) vaccine. You may need a Td booster every 10 years.  Zoster vaccine. You may need this after age 50.  Pneumococcal 13-valent conjugate (PCV13) vaccine. You may need this if you have certain conditions and were not previously vaccinated.  Pneumococcal polysaccharide (PPSV23) vaccine. You may need one or two doses if you smoke cigarettes or if you have certain conditions. Talk to your health care provider about which screenings and vaccines you need and how often you need them. This information is not intended to replace advice given to you by your health care provider. Make sure you discuss any questions you have with your health care provider. Document Released: 06/04/2015 Document Revised: 01/26/2016 Document Reviewed: 03/09/2015 Elsevier Interactive Patient Education  2017 [place] Prevention in the Home Falls can cause injuries. They can happen to people of all ages. There are many things you can do to make your home safe and to help prevent falls. What can I do on the outside of my home?  Regularly fix the edges of walkways and driveways and fix any cracks.  Remove anything that might make you trip as you walk through a door, such as a raised step or threshold.  Trim any bushes or trees on the path to your home.  Use bright outdoor lighting.  Clear any walking paths of anything that might make someone trip, such as rocks or tools.  Regularly check to see if handrails are loose or broken. Make sure that both sides of any steps have handrails.  Any raised decks and porches should have guardrails on the edges.  Have any leaves, snow, or ice cleared  regularly.  Use sand or salt on walking paths during winter.  Clean up any spills in your garage right away. This includes oil or grease spills. What can I do in the bathroom?  Use night lights.  Install grab bars by the toilet and in the tub and shower. Do not use towel bars as grab bars.  Use non-skid mats or decals in the tub or shower.  If you need to sit down in the shower, use a plastic, non-slip stool.  Keep the floor dry. Clean up any water that spills on the floor as soon as it happens.  Remove soap buildup in the tub or shower regularly.  Attach bath mats securely with double-sided non-slip rug tape.  Do not have throw rugs and other things on the floor that can make you trip. What can I do in the bedroom?  Use night lights.  Make sure that you have a light by your bed that is easy to reach.  Do not use any sheets or blankets that are too big for your bed. They should not hang down onto the floor.  Have a firm chair that has side arms. You can use this for support while you get dressed.  Do not have throw rugs and other things on the floor that  can make you trip. What can I do in the kitchen?  Clean up any spills right away.  Avoid walking on wet floors.  Keep items that you use a lot in easy-to-reach places.  If you need to reach something above you, use a strong step stool that has a grab bar.  Keep electrical cords out of the way.  Do not use floor polish or wax that makes floors slippery. If you must use wax, use non-skid floor wax.  Do not have throw rugs and other things on the floor that can make you trip. What can I do with my stairs?  Do not leave any items on the stairs.  Make sure that there are handrails on both sides of the stairs and use them. Fix handrails that are broken or loose. Make sure that handrails are as long as the stairways.  Check any carpeting to make sure that it is firmly attached to the stairs. Fix any carpet that is loose  or worn.  Avoid having throw rugs at the top or bottom of the stairs. If you do have throw rugs, attach them to the floor with carpet tape.  Make sure that you have a light switch at the top of the stairs and the bottom of the stairs. If you do not have them, ask someone to add them for you. What else can I do to help prevent falls?  Wear shoes that:  Do not have high heels.  Have rubber bottoms.  Are comfortable and fit you well.  Are closed at the toe. Do not wear sandals.  If you use a stepladder:  Make sure that it is fully opened. Do not climb a closed stepladder.  Make sure that both sides of the stepladder are locked into place.  Ask someone to hold it for you, if possible.  Clearly mark and make sure that you can see:  Any grab bars or handrails.  First and last steps.  Where the edge of each step is.  Use tools that help you move around (mobility aids) if they are needed. These include:  Canes.  Walkers.  Scooters.  Crutches.  Turn on the lights when you go into a dark area. Replace any light bulbs as soon as they burn out.  Set up your furniture so you have a clear path. Avoid moving your furniture around.  If any of your floors are uneven, fix them.  If there are any pets around you, be aware of where they are.  Review your medicines with your doctor. Some medicines can make you feel dizzy. This can increase your chance of falling. Ask your doctor what other things that you can do to help prevent falls. This information is not intended to replace advice given to you by your health care provider. Make sure you discuss any questions you have with your health care provider. Document Released: 03/04/2009 Document Revised: 10/14/2015 Document Reviewed: 06/12/2014 Elsevier Interactive Patient Education  2017 [employer].

## 2018-08-11 ENCOUNTER — Encounter: Payer: Self-pay | Admitting: Internal Medicine

## 2018-08-13 ENCOUNTER — Ambulatory Visit: Payer: Self-pay | Admitting: Internal Medicine

## 2018-09-09 ENCOUNTER — Encounter: Payer: Self-pay | Admitting: Internal Medicine

## 2018-11-11 ENCOUNTER — Telehealth: Payer: Self-pay | Admitting: Internal Medicine

## 2018-11-11 NOTE — Telephone Encounter (Signed)
Pt is moving and going to different provider

## 2018-11-11 NOTE — Telephone Encounter (Signed)
Patient no longer being see by our clinic. FYI.

## 2019-01-31 ENCOUNTER — Other Ambulatory Visit: Payer: Self-pay | Admitting: Internal Medicine

## 2019-08-11 ENCOUNTER — Ambulatory Visit: Payer: Self-pay
